# Patient Record
Sex: Female | Born: 1955 | Smoking: Never smoker
Health system: Southern US, Community
[De-identification: ages and names within clinical notes are randomized; demographics above are authoritative.]

## PROBLEM LIST (undated history)

## (undated) DIAGNOSIS — F419 Anxiety disorder, unspecified: Secondary | ICD-10-CM

## (undated) DIAGNOSIS — N891 Moderate vaginal dysplasia: Secondary | ICD-10-CM

## (undated) DIAGNOSIS — K759 Inflammatory liver disease, unspecified: Secondary | ICD-10-CM

## (undated) DIAGNOSIS — R87620 Atypical squamous cells of undetermined significance on cytologic smear of vagina (ASC-US): Secondary | ICD-10-CM

## (undated) DIAGNOSIS — Z973 Presence of spectacles and contact lenses: Secondary | ICD-10-CM

## (undated) DIAGNOSIS — K589 Irritable bowel syndrome without diarrhea: Secondary | ICD-10-CM

## (undated) DIAGNOSIS — R32 Unspecified urinary incontinence: Secondary | ICD-10-CM

## (undated) DIAGNOSIS — Z87412 Personal history of vulvar dysplasia: Secondary | ICD-10-CM

## (undated) DIAGNOSIS — C801 Malignant (primary) neoplasm, unspecified: Secondary | ICD-10-CM

## (undated) DIAGNOSIS — I10 Essential (primary) hypertension: Secondary | ICD-10-CM

## (undated) DIAGNOSIS — R3989 Other symptoms and signs involving the genitourinary system: Secondary | ICD-10-CM

## (undated) DIAGNOSIS — N816 Rectocele: Secondary | ICD-10-CM

## (undated) DIAGNOSIS — T7840XA Allergy, unspecified, initial encounter: Secondary | ICD-10-CM

## (undated) DIAGNOSIS — M1990 Unspecified osteoarthritis, unspecified site: Secondary | ICD-10-CM

## (undated) DIAGNOSIS — M8580 Other specified disorders of bone density and structure, unspecified site: Secondary | ICD-10-CM

## (undated) DIAGNOSIS — R87811 Vaginal high risk human papillomavirus (HPV) DNA test positive: Secondary | ICD-10-CM

## (undated) HISTORY — PX: TUBAL LIGATION: SHX77

## (undated) HISTORY — DX: Allergy, unspecified, initial encounter: T78.40XA

## (undated) HISTORY — DX: Anxiety disorder, unspecified: F41.9

## (undated) HISTORY — DX: Irritable bowel syndrome, unspecified: K58.9

## (undated) HISTORY — DX: Unspecified urinary incontinence: R32

## (undated) HISTORY — DX: Moderate vaginal dysplasia: N89.1

## (undated) HISTORY — PX: COLONOSCOPY: SHX174

## (undated) HISTORY — DX: Atypical squamous cells of undetermined significance on cytologic smear of vagina (ASC-US): R87.620

## (undated) HISTORY — DX: Essential (primary) hypertension: I10

## (undated) HISTORY — DX: Other specified disorders of bone density and structure, unspecified site: M85.80

## (undated) HISTORY — DX: Vaginal high risk human papillomavirus (HPV) DNA test positive: R87.811

## (undated) HISTORY — DX: Irritable bowel syndrome without diarrhea: K58.9

---

## 1997-04-17 HISTORY — PX: VAGINAL HYSTERECTOMY: SUR661

## 2003-04-28 ENCOUNTER — Ambulatory Visit (HOSPITAL_COMMUNITY): Admission: RE | Admit: 2003-04-28 | Discharge: 2003-04-28 | Payer: Self-pay | Admitting: Family Medicine

## 2003-05-14 ENCOUNTER — Other Ambulatory Visit: Admission: RE | Admit: 2003-05-14 | Discharge: 2003-05-14 | Payer: Self-pay | Admitting: Dermatology

## 2004-01-03 ENCOUNTER — Emergency Department (HOSPITAL_COMMUNITY): Admission: EM | Admit: 2004-01-03 | Discharge: 2004-01-03 | Payer: Self-pay | Admitting: Emergency Medicine

## 2004-11-01 ENCOUNTER — Other Ambulatory Visit: Admission: RE | Admit: 2004-11-01 | Discharge: 2004-11-01 | Payer: Self-pay | Admitting: *Deleted

## 2006-01-10 ENCOUNTER — Other Ambulatory Visit: Admission: RE | Admit: 2006-01-10 | Discharge: 2006-01-10 | Payer: Self-pay | Admitting: *Deleted

## 2008-10-07 ENCOUNTER — Ambulatory Visit (HOSPITAL_COMMUNITY): Admission: RE | Admit: 2008-10-07 | Discharge: 2008-10-07 | Payer: Self-pay | Admitting: Family Medicine

## 2008-10-12 ENCOUNTER — Ambulatory Visit (HOSPITAL_COMMUNITY): Admission: RE | Admit: 2008-10-12 | Discharge: 2008-10-12 | Payer: Self-pay | Admitting: Obstetrics & Gynecology

## 2010-05-07 ENCOUNTER — Encounter: Payer: Self-pay | Admitting: Family Medicine

## 2011-07-17 HISTORY — PX: AUGMENTATION MAMMAPLASTY: SUR837

## 2012-05-23 ENCOUNTER — Ambulatory Visit: Payer: Self-pay | Admitting: Gynecology

## 2012-05-29 ENCOUNTER — Ambulatory Visit: Payer: Self-pay | Admitting: Gynecology

## 2012-05-30 ENCOUNTER — Ambulatory Visit: Payer: Self-pay | Admitting: Gynecology

## 2012-06-11 ENCOUNTER — Ambulatory Visit: Payer: Self-pay | Admitting: Gynecology

## 2012-06-26 ENCOUNTER — Encounter: Payer: Self-pay | Admitting: Gynecology

## 2012-06-26 ENCOUNTER — Ambulatory Visit (INDEPENDENT_AMBULATORY_CARE_PROVIDER_SITE_OTHER): Payer: BC Managed Care – PPO | Admitting: Gynecology

## 2012-06-26 ENCOUNTER — Other Ambulatory Visit (HOSPITAL_COMMUNITY)
Admission: RE | Admit: 2012-06-26 | Discharge: 2012-06-26 | Disposition: A | Discharge status: Home / Self Care | Payer: BC Managed Care – PPO | Source: Ambulatory Visit | Attending: Gynecology | Admitting: Gynecology

## 2012-06-26 VITALS — BP 130/84 | Ht 66.5 in | Wt 143.0 lb

## 2012-06-26 DIAGNOSIS — Z1151 Encounter for screening for human papillomavirus (HPV): Secondary | ICD-10-CM | POA: Insufficient documentation

## 2012-06-26 DIAGNOSIS — G4700 Insomnia, unspecified: Secondary | ICD-10-CM

## 2012-06-26 DIAGNOSIS — Z01419 Encounter for gynecological examination (general) (routine) without abnormal findings: Secondary | ICD-10-CM

## 2012-06-26 DIAGNOSIS — R233 Spontaneous ecchymoses: Secondary | ICD-10-CM

## 2012-06-26 DIAGNOSIS — R238 Other skin changes: Secondary | ICD-10-CM

## 2012-06-26 DIAGNOSIS — Z780 Asymptomatic menopausal state: Secondary | ICD-10-CM

## 2012-06-26 LAB — CBC WITH DIFFERENTIAL/PLATELET
Basophils Absolute: 0 10*3/uL (ref 0.0–0.1)
Basophils Relative: 0 % (ref 0–1)
Eosinophils Absolute: 0.1 10*3/uL (ref 0.0–0.7)
Eosinophils Relative: 2 % (ref 0–5)
HCT: 42.8 % (ref 36.0–46.0)
Hemoglobin: 14.4 g/dL (ref 12.0–15.0)
Lymphocytes Relative: 32 % (ref 12–46)
Lymphs Abs: 1.9 10*3/uL (ref 0.7–4.0)
MCH: 31 pg (ref 26.0–34.0)
MCHC: 33.6 g/dL (ref 30.0–36.0)
MCV: 92.2 fL (ref 78.0–100.0)
Monocytes Absolute: 0.4 10*3/uL (ref 0.1–1.0)
Monocytes Relative: 6 % (ref 3–12)
Neutro Abs: 3.7 10*3/uL (ref 1.7–7.7)
Neutrophils Relative %: 60 % (ref 43–77)
Platelets: 264 10*3/uL (ref 150–400)
RBC: 4.64 MIL/uL (ref 3.87–5.11)
RDW: 13.5 % (ref 11.5–15.5)
WBC: 6.1 10*3/uL (ref 4.0–10.5)

## 2012-06-26 MED ORDER — ESTRADIOL 0.52 MG/0.87 GM (0.06%) TD GEL: 1.0000 "application " | Freq: Every day | TRANSDERMAL | Status: DC

## 2012-06-26 NOTE — Patient Instructions (Addendum)
Shingles Vaccine What You Need to Know WHAT IS SHINGLES?  Shingles is a painful skin rash, often with blisters. It is also called Herpes Zoster or just Zoster.  A shingles rash usually appears on one side of the face or body and lasts from 2 to 4 weeks. Its main symptom is pain, which can be quite severe. Other symptoms of shingles can include fever, headache, chills, and upset stomach. Very rarely, a shingles infection can lead to pneumonia, hearing problems, blindness, brain inflammation (encephalitis), or death.  For about 1 person in 5, severe pain can continue even after the rash clears up. This is called post-herpetic neuralgia.  Shingles is caused by the Varicella Zoster virus. This is the same virus that causes chickenpox. Only someone who has had a case of chickenpox or rarely, has gotten chickenpox vaccine, can get shingles. The virus stays in your body. It can reappear many years later to cause a case of shingles.  You cannot catch shingles from another person with shingles. However, a person who has never had chickenpox (or chickenpox vaccine) could get chickenpox from someone with shingles. This is not very common.  Shingles is far more common in people 50 and older than in younger people. It is also more common in people whose immune systems are weakened because of a disease such as cancer or drugs such as steroids or chemotherapy.  At least 1 million people get shingles per year in the [place]. SHINGLES VACCINE  A vaccine for shingles was licensed in 2006. In clinical trials, the vaccine reduced the risk of shingles by 50%. It can also reduce the pain in people who still get shingles after being vaccinated.  A single dose of shingles vaccine is recommended for adults 60 years of age and older. SOME PEOPLE SHOULD NOT GET SHINGLES VACCINE OR SHOULD WAIT A person should not get shingles vaccine if he or she:  Has ever had a life-threatening allergic reaction to gelatin, the  antibiotic neomycin, or any other component of shingles vaccine. Tell your caregiver if you have any severe allergies.  Has a weakened immune system because of current:  AIDS or another disease that affects the immune system.  Treatment with drugs that affect the immune system, such as prolonged use of high-dose steroids.  Cancer treatment, such as radiation or chemotherapy.  Cancer affecting the bone marrow or lymphatic system, such as leukemia or lymphoma.  Is pregnant, or might be pregnant. Women should not become pregnant until at least 4 weeks after getting shingles vaccine. Someone with a minor illness, such as a cold, may be vaccinated. Anyone with a moderate or severe acute illness should usually wait until he or she recovers before getting the vaccine. This includes anyone with a temperature of 101.3 F (38 C) or higher. WHAT ARE THE RISKS FROM SHINGLES VACCINE?  A vaccine, like any medicine, could possibly cause serious problems, such as severe allergic reactions. However, the risk of a vaccine causing serious harm, or death, is extremely small.  No serious problems have been identified with shingles vaccine. Mild Problems  Redness, soreness, swelling, or itching at the site of the injection (about 1 person in 3).  Headache (about 1 person in 70). Like all vaccines, shingles vaccine is being closely monitored for unusual or severe problems. WHAT IF THERE IS A MODERATE OR SEVERE REACTION? What should I look for? Any unusual condition, such as a severe allergic reaction or a high fever. If a severe allergic reaction   occurred, it would be within a few minutes to an hour after the shot. Signs of a serious allergic reaction can include difficulty breathing, weakness, hoarseness or wheezing, a fast heartbeat, hives, dizziness, paleness, or swelling of the throat. What should I do?  Call your caregiver, or get the person to a caregiver right away.  Tell the caregiver what  happened, the date and time it happened, and when the vaccination was given.  Ask the caregiver to report the reaction by filing a Vaccine Adverse Event Reporting System (VAERS) form. Or, you can file this report through the VAERS web site at www.vaers.LAgents.no or by calling 1-220-078-3236. VAERS does not provide medical advice. HOW CAN I LEARN MORE?  Ask your caregiver. He or she can give you the vaccine package insert or suggest other sources of information.  Contact the Centers for Disease Control and Prevention (CDC):  Call 615-685-7769 (1-800-CDC-INFO).  Visit the CDC website at PicCapture.uy CDC Shingles Vaccine VIS (01/21/08) Document Released: 01/29/2006 Document Revised: 06/26/2011 Document Reviewed: 01/21/2008 [facility] Patient Information 2013 [place], [place].  Menopause Menopause is the normal time of life when menstrual periods stop completely. Menopause is complete when you have missed 12 consecutive menstrual periods. It usually occurs between the ages of 67 to 77, with an average age of 17. Very rarely does a woman develop menopause before 58 years old. At menopause, your ovaries stop producing the female hormones, estrogen and progesterone. This can cause undesirable symptoms and also affect your health. Sometimes the symptoms may occur 4 to 5 years before the menopause begins. There is no relationship between menopause and:  Oral contraceptives.  Number of children you had.  Race.  The age your menstrual periods started (menarche). Heavy smokers and very thin women may develop menopause earlier in life. CAUSES  The ovaries stop producing the female hormones estrogen and progesterone.  Other causes include:  Surgery to remove both ovaries.  The ovaries stop functioning for no known reason.  Tumors of the pituitary gland in the brain.  Medical disease that affects the ovaries and hormone production.  Radiation treatment to the abdomen or  pelvis.  Chemotherapy that affects the ovaries. SYMPTOMS   Hot flashes.  Night sweats.  Decrease in sex drive.  Vaginal dryness and thinning of the vagina causing painful intercourse.  Dryness of the skin and developing wrinkles.  Headaches.  Tiredness.  Irritability.  Memory problems.  Weight gain.  Bladder infections.  Hair growth of the face and chest.  Infertility. More serious symptoms include:  Loss of bone (osteoporosis) causing breaks (fractures).  Depression.  Hardening and narrowing of the arteries (atherosclerosis) causing heart attacks and strokes. DIAGNOSIS   When the menstrual periods have stopped for 12 straight months.  Physical exam.  Hormone studies of the blood. TREATMENT  There are many treatment choices and nearly as many questions about them. The decisions to treat or not to treat menopausal changes is an individual choice made with your caregiver. Your caregiver can discuss the treatments with you. Together, you can decide which treatment will work best for you. Your treatment choices may include:   Hormone therapy (estorgen and progesterone).  Non-hormonal medications.  Treating the individual symptoms with medication (for example antidepressants for depression).  Herbal medications that may help specific symptoms.  Counseling by a psychiatrist or psychologist.  Group therapy.  Lifestyle changes including:  Eating healthy.  Regular exercise.  Limiting caffeine and alcohol.  Stress management and meditation.  No treatment. HOME CARE INSTRUCTIONS  Take the medication your caregiver gives you as directed.  Get plenty of sleep and rest.  Exercise regularly.  Eat a diet that contains calcium (good for the bones) and soy products (acts like estrogen hormone).  Avoid alcoholic beverages.  Do not smoke.  If you have hot flashes, dress in layers.  Take supplements, calcium and vitamin D to strengthen bones.  You  can use over-the-counter lubricants or moisturizers for vaginal dryness.  Group therapy is sometimes very helpful.  Acupuncture may be helpful in some cases. SEEK MEDICAL CARE IF:   You are not sure you are in menopause.  You are having menopausal symptoms and need advice and treatment.  You are still having menstrual periods after age 44.  You have pain with intercourse.  Menopause is complete (no menstrual period for 12 months) and you develop vaginal bleeding.  You need a referral to a specialist (gynecologist, psychiatrist or psychologist) for treatment. SEEK IMMEDIATE MEDICAL CARE IF:   You have severe depression.  You have excessive vaginal bleeding.  You fell and think you have a broken bone.  You have pain when you urinate.  You develop leg or chest pain.  You have a fast pounding heart beat (palpitations).  You have severe headaches.  You develop vision problems.  You feel a lump in your breast.  You have abdominal pain or severe indigestion. Document Released: 06/24/2003 Document Revised: 06/26/2011 Document Reviewed: 01/30/2008 [facility] Patient Information 2013 [place], [place].  Hormone Therapy At menopause, your body begins making less estrogen and progesterone hormones. This causes the body to stop having menstrual periods. This is because estrogen and progesterone hormones control your periods and menstrual cycle. A lack of estrogen may cause symptoms such as:  Hot flushes (or hot flashes).  Vaginal dryness.  Dry skin.  Loss of sex drive.  Risk of bone loss (osteoporosis). When this happens, you may choose to take hormone therapy to get back the estrogen lost during menopause. When the hormone estrogen is given alone, it is usually referred to as ET (Estrogen Therapy). When the hormone progestin is combined with estrogen, it is generally called HT (Hormone Therapy). This was formerly known as hormone replacement therapy (HRT). Your caregiver can  help you make a decision on what will be best for you. The decision to use HT seems to change often as new studies are done. Many studies do not agree on the benefits of hormone replacement therapy. LIKELY BENEFITS OF HT INCLUDE PROTECTION FROM:  Hot Flushes (also called hot flashes) - A hot flush is a sudden feeling of heat that spreads over the face and body. The skin may redden like a blush. It is connected with sweats and sleep disturbance. Women going through menopause may have hot flushes a few times a month or several times per day depending on the woman.  Osteoporosis (bone loss)- Estrogen helps guard against bone loss. After menopause, a woman's bones slowly lose calcium and become weak and brittle. As a result, bones are more likely to break. The hip, wrist, and spine are affected most often. Hormone therapy can help slow bone loss after menopause. Weight bearing exercise and taking calcium with vitamin D also can help prevent bone loss. There are also medications that your caregiver can prescribe that can help prevent osteoporosis.  Vaginal Dryness - Loss of estrogen causes changes in the vagina. Its lining may become thin and dry. These changes can cause pain and bleeding during sexual intercourse. Dryness can also lead  to infections. This can cause burning and itching. (Vaginal estrogen treatment can help relieve pain, itching, and dryness.)  Urinary Tract Infections are more common after menopause because of lack of estrogen. Some women also develop urinary incontinence because of low estrogen levels in the vagina and bladder.  Possible other benefits of estrogen include a positive effect on mood and short-term memory in women. RISKS AND COMPLICATIONS  Using estrogen alone without progesterone causes the lining of the uterus to grow. This increases the risk of lining of the uterus (endometrial) cancer. Your caregiver should give another hormone called progestin if you have a  uterus.  Women who take combined (estrogen and progestin) HT appear to have an increased risk of breast cancer. The risk appears to be small, but increases throughout the time that HT is taken.  Combined therapy also makes the breast tissue slightly denser which makes it harder to read mammograms (breast X-rays).  Combined, estrogen and progesterone therapy can be taken together every day, in which case there may be spotting of blood. HT therapy can be taken cyclically in which case you will have menstrual periods. Cyclically means HT is taken for a set amount of days, then not taken, then this process is repeated.  HT may increase the risk of stroke, heart attack, breast cancer and forming blood clots in your leg.  Transdermal estrogen (estrogen that is absorbed through the skin with a patch or a cream) may have more positive results with:  Cholesterol.  Blood pressure.  Blood clots. Having the following conditions may indicate you should not have HT:  Endometrial cancer.  Liver disease.  Breast cancer.  Heart disease.  History of blood clots.  Stroke. TREATMENT   If you choose to take HT and have a uterus, usually estrogen and progestin are prescribed.  Your caregiver will help you decide the best way to take the medications.  Possible ways to take estrogen include:  Pills.  Patches.  Gels.  Sprays.  Vaginal estrogen cream, rings and tablets.  It is best to take the lowest dose possible that will help your symptoms and take them for the shortest period of time that you can.  Hormone therapy can help relieve some of the problems (symptoms) that affect women at menopause. Before making a decision about HT, talk to your caregiver about what is best for you. Be well informed and comfortable with your decisions. HOME CARE INSTRUCTIONS   Follow your caregivers advice when taking the medications.  A Pap test is done to screen for cervical cancer.  The first Pap  test should be done at age 46.  Between ages 92 and 49, Pap tests are repeated every 2 years.  Beginning at age 54, you are advised to have a Pap test every 3 years as long as your past 3 Pap tests have been normal.  Some women have medical problems that increase the chance of getting cervical cancer. Talk to your caregiver about these problems. It is especially important to talk to your caregiver if a new problem develops soon after your last Pap test. In these cases, your caregiver may recommend more frequent screening and Pap tests.  The above recommendations are the same for women who have or have not gotten the vaccine for HPV (Human Papillomavirus).  If you had a hysterectomy for a problem that was not a cancer or a condition that could lead to cancer, then you no longer need Pap tests. However, even if you no longer  need a Pap test, a regular exam is a good idea to make sure no other problems are starting.   If you are between ages 73 and 67, and you have had normal Pap tests going back 10 years, you no longer need Pap tests. However, even if you no longer need a Pap test, a regular exam is a good idea to make sure no other problems are starting.   If you have had past treatment for cervical cancer or a condition that could lead to cancer, you need Pap tests and screening for cancer for at least 20 years after your treatment.  If Pap tests have been discontinued, risk factors (such as a new sexual partner) need to be re-assessed to determine if screening should be resumed.  Some women may need screenings more often if they are at high risk for cervical cancer.  Get mammograms done as per the advice of your caregiver. SEEK IMMEDIATE MEDICAL CARE IF:  You develop abnormal vaginal bleeding.  You have pain or swelling in your legs, shortness of breath, or chest pain.  You develop dizziness or headaches.  You have lumps or changes in your breasts or armpits.  You have slurred  speech.  You develop weakness or numbness of your arms or legs.  You have pain, burning, or bleeding when urinating.  You develop abdominal pain. Document Released: 12/31/2002 Document Revised: 06/26/2011 Document Reviewed: 04/20/2010 [facility] Patient Information 2013 [place], [place].

## 2012-06-27 ENCOUNTER — Encounter: Payer: Self-pay | Admitting: Internal Medicine

## 2012-06-27 ENCOUNTER — Other Ambulatory Visit: Payer: Self-pay

## 2012-06-27 DIAGNOSIS — Z1231 Encounter for screening mammogram for malignant neoplasm of breast: Secondary | ICD-10-CM

## 2012-06-27 LAB — FOLLICLE STIMULATING HORMONE: FSH: 41.9 m[IU]/mL

## 2012-06-27 LAB — TSH: TSH: 0.756 u[IU]/mL (ref 0.350–4.500)

## 2012-06-27 NOTE — Progress Notes (Signed)
Stephanie Armstrong 1955-07-25 [id number]   History:    56 y.o.  for annual gyn exam who is new to the practice. Patient has been complaining of insomnia and minor vasomotor symptoms. Patient with past history of TVH secondary to menorrhagia. Patient denies any prior history of abnormal Pap smears. Patient with no prior screening colonoscopy. She brought some lab work from her primary physician at [facility] ( Dr. Gerda Diss). It was noted that her [facility] and LH were in the menopausal range. She had a normal basal metabolic panel and lipid profile with the exception of slightly elevated total cholesterol at 209. She brought to my attention that she has easy  Bruisability. She also brought to my attention that her mother has history of Paget's disease.  Past medical history,surgical history, family history and social history were all reviewed and documented in the EPIC chart.  Gynecologic History No LMP recorded. Patient has had a hysterectomy. Contraception: status post hysterectomy Last Pap: ?Marland Kitchen Results were: no prior history of abnormal Pap smears Last mammogram: greater than 5 years ago. Results were: normal  Obstetric History OB History   Grav Para Term Preterm Abortions TAB SAB Ect Mult Living   3 3        3      # Outc Date GA Lbr Len/2nd Wgt Sex Del Anes PTL Lv   1 PAR            2 PAR            3 PAR                ROS: A ROS was performed and pertinent positives and negatives are included in the history.  GENERAL: No fevers or chills. HEENT: No change in vision, no earache, sore throat or sinus congestion. NECK: No pain or stiffness. CARDIOVASCULAR: No chest pain or pressure. No palpitations. PULMONARY: No shortness of breath, cough or wheeze. GASTROINTESTINAL: No abdominal pain, nausea, vomiting or diarrhea, melena or bright red blood per rectum. GENITOURINARY: No urinary frequency, urgency, hesitancy or dysuria. MUSCULOSKELETAL: No joint or muscle pain, no back pain, no  recent trauma. DERMATOLOGIC: No rash, no itching, no lesions. ENDOCRINE: No polyuria, polydipsia, no heat or cold intolerance. No recent change in weight. HEMATOLOGICAL: No anemia or easy bruising or bleeding. NEUROLOGIC: No headache, seizures, numbness, tingling or weakness. PSYCHIATRIC: No depression, no loss of interest in normal activity or change in sleep pattern.     Exam: chaperone present  BP 130/84  Ht 5' 6.5" (1.689 m)  Wt 143 lb (64.864 kg)  BMI 22.74 kg/m2  Body mass index is 22.74 kg/(m^2).  General appearance : Well developed well nourished female. No acute distress HEENT: Neck supple, trachea midline, no carotid bruits, no thyroidmegaly Lungs: Clear to auscultation, no rhonchi or wheezes, or rib retractions  Heart: Regular rate and rhythm, no murmurs or gallops Breast:Examined in sitting and supine position were symmetrical in appearance, no palpable masses or tenderness,  no skin retraction, no nipple inversion, no nipple discharge, no skin discoloration, no axillary or supraclavicular lymphadenopathy Abdomen: no palpable masses or tenderness, no rebound or guarding Extremities: no edema or skin discoloration or tenderness  Pelvic:  Bartholin, Urethra, Skene Glands: Within normal limits             Vagina: No gross lesions or discharge  Cervix: absent  Uterus Absent  Adnexa  Without masses or tenderness  Anus and perineum  normal   Rectovaginal  normal sphincter  tone without palpated masses or tenderness             Hemoccult card provided     Assessment/Plan:  57 y.o. female for annual exam who is menopausal. Her symptoms may be best served by starting her on a low  Dose topical estrogen such as elestrin 0.06% for her to apply transdermally to one arm each bedtime. We had a long discussion of the women's health initiative study. We discussed potential risk of breast cancer. We discussed the risks benefits pros and cons. We'll also check a von Willebrand panel as a  result of her easy bruisability as well as a CBC. We'll also check a TSH and we will do her Pap smear today. We did discuss the new screening guidelines. She was given names of colleagues in the community to schedule colonoscopy. I did give her the Hemoccult cards to present to the office for testing a later date. She will schedule a bone density study here in our office the next couple weeks. She was given a prescription to schedule her shingles vaccine. She was also provided with a requisition to schedule her mammogram and was stressed upon her the importance of monthly self breast examination. We also discussed importance of calcium vitamin D for osteoporosis prevention. We'll hold off on giving her any medication for insomnia see how she responds to the estrogen replacement therapy.    Ok Edwards MD, 9:34 AM 06/27/2012

## 2012-07-01 LAB — VON WILLEBRAND PANEL
Coagulation Factor VIII: 64 % — ABNORMAL LOW (ref 73–140)
Ristocetin Co-factor, Plasma: 71 % (ref 42–200)
Von Willebrand Antigen, Plasma: 76 % (ref 50–217)

## 2012-07-02 ENCOUNTER — Other Ambulatory Visit: Payer: Self-pay | Admitting: Gynecology

## 2012-07-02 DIAGNOSIS — R233 Spontaneous ecchymoses: Secondary | ICD-10-CM

## 2012-07-02 DIAGNOSIS — R238 Other skin changes: Secondary | ICD-10-CM

## 2012-07-09 ENCOUNTER — Other Ambulatory Visit: Payer: BC Managed Care – PPO

## 2012-07-09 DIAGNOSIS — R233 Spontaneous ecchymoses: Secondary | ICD-10-CM

## 2012-07-09 DIAGNOSIS — R238 Other skin changes: Secondary | ICD-10-CM

## 2012-07-11 LAB — FACTOR 8 ASSAY: Coagulation Factor VIII: 71 % — ABNORMAL LOW (ref 73–140)

## 2012-07-12 ENCOUNTER — Telehealth: Payer: Self-pay | Admitting: Gynecology

## 2012-07-12 NOTE — Telephone Encounter (Signed)
Patient was seen the office as a new patient on 06/27/2012 see previous note for detail. During the examination brought to my attention that time she has noticed easy bruisability. She had a previous vaginal hysterectomy secondary to menorrhagia at another facility with no problems at that point. Her recent lab work consisted of the following:  Normal CBC Von Willebrand panel:            Factor VIII with a value of 64 (normal 73-140%)            Factor VIII repeated 2 weeks later value of 71            Von Willebrand antigen 76 normal range            Ristocetin : 71 normal range  I discussed the case with one of the hematologist oncologist at the [facility] and he did not seem worried at this point having patient had previous surgery with no problems and value for factor VIII slightly below normal. Recommendations to repeat the test in one year.

## 2012-07-16 ENCOUNTER — Telehealth: Payer: Self-pay | Admitting: *Deleted

## 2012-07-16 NOTE — Telephone Encounter (Signed)
Pt is calling requesting lab results for Factor VIII  07/09/12. Please advise

## 2012-07-16 NOTE — Telephone Encounter (Signed)
Pt informed with repeat test in 1 year.

## 2012-07-16 NOTE — Telephone Encounter (Signed)
Patient was seen the office as a new patient on 06/27/2012 see previous note for detail. During the examination brought to my attention that time she has noticed easy bruisability. She had a previous vaginal hysterectomy secondary to menorrhagia at another facility with no problems at that point. Her recent lab work consisted of the following:  Normal CBC Von Willebrand panel:            Factor VIII with a value of 64 (normal 73-140%)            Factor VIII repeated 2 weeks later value of 71            Von Willebrand antigen 76 normal range            Ristocetin : 71 normal range  I discussed the case with one of the hematologist oncologist at the [facility] and he did not seem worried at this point having patient had previous surgery with no problems and value for factor VIII slightly below normal. Recommendations to repeat the test in one year. 

## 2012-07-30 ENCOUNTER — Ambulatory Visit (INDEPENDENT_AMBULATORY_CARE_PROVIDER_SITE_OTHER): Payer: BC Managed Care – PPO

## 2012-07-30 DIAGNOSIS — M8580 Other specified disorders of bone density and structure, unspecified site: Secondary | ICD-10-CM

## 2012-07-30 DIAGNOSIS — M899 Disorder of bone, unspecified: Secondary | ICD-10-CM

## 2012-07-30 DIAGNOSIS — Z780 Asymptomatic menopausal state: Secondary | ICD-10-CM

## 2012-07-31 ENCOUNTER — Ambulatory Visit (AMBULATORY_SURGERY_CENTER): Payer: BC Managed Care – PPO

## 2012-07-31 VITALS — Ht 67.0 in | Wt 141.4 lb

## 2012-07-31 DIAGNOSIS — Z1211 Encounter for screening for malignant neoplasm of colon: Secondary | ICD-10-CM

## 2012-07-31 MED ORDER — MOVIPREP 100 G PO SOLR: 1.0000 | Freq: Once | ORAL | Status: DC

## 2012-08-01 ENCOUNTER — Encounter: Payer: Self-pay | Admitting: Internal Medicine

## 2012-08-02 ENCOUNTER — Ambulatory Visit
Admission: RE | Admit: 2012-08-02 | Discharge: 2012-08-02 | Disposition: A | Discharge status: Home / Self Care | Payer: BC Managed Care – PPO | Source: Ambulatory Visit

## 2012-08-02 DIAGNOSIS — Z1231 Encounter for screening mammogram for malignant neoplasm of breast: Secondary | ICD-10-CM

## 2012-08-08 ENCOUNTER — Ambulatory Visit (INDEPENDENT_AMBULATORY_CARE_PROVIDER_SITE_OTHER): Payer: BC Managed Care – PPO | Admitting: Family Medicine

## 2012-08-08 ENCOUNTER — Encounter: Payer: Self-pay | Admitting: Family Medicine

## 2012-08-08 VITALS — BP 132/80 | Temp 97.8°F | Wt 139.2 lb

## 2012-08-08 DIAGNOSIS — M2241 Chondromalacia patellae, right knee: Secondary | ICD-10-CM | POA: Insufficient documentation

## 2012-08-08 DIAGNOSIS — M2240 Chondromalacia patellae, unspecified knee: Secondary | ICD-10-CM

## 2012-08-08 MED ORDER — ETODOLAC 400 MG PO TABS: 400.0000 mg | ORAL_TABLET | Freq: Two times a day (BID) | ORAL | Status: DC

## 2012-08-08 NOTE — Progress Notes (Signed)
  Subjective:    Patient ID: Stephanie Armstrong, female    DOB: [birth year], 57 y.o.   MRN: [id number]  HPI Hx of leg injury two yrs ag. This occurred with excessive running. Now patient gets stiffness in both legs. Right knee is primarily medial knee pain. Morning stiffness. Several weeks ago started running again. Has developed swelling in the last 2 weeks. Also discomfort behind the knee. No injury back in school. Has small red patch of her eyes was sitting on her knee last night. Taking occasional over-the-counter meds.   Review of Systems ROS otherwise negative.    Objective:   Physical Exam  Alert no acute distress. Vitals review. Lungs clear. Heart regular in rhythm. Knee diffusely inflamed. Very significant crepitations. Probable posterior knee Baker's cyst palpated. 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 #2 cold skin damage-discussed. Plan consider taking a different form of exercise. Lodine twice a day with food when necessary. No x-rays at this time. Rationale discussed. WSL

## 2012-08-08 NOTE — Patient Instructions (Signed)
Chondromalacia patella plus arthritis. Take the antiinflam med as diredted

## 2012-08-14 ENCOUNTER — Encounter: Payer: BC Managed Care – PPO | Admitting: Internal Medicine

## 2012-08-29 ENCOUNTER — Encounter: Payer: Self-pay | Admitting: Internal Medicine

## 2012-08-29 ENCOUNTER — Ambulatory Visit (AMBULATORY_SURGERY_CENTER): Payer: BC Managed Care – PPO | Admitting: Internal Medicine

## 2012-08-29 VITALS — BP 118/76 | HR 54 | Temp 97.6°F | Resp 18 | Ht 67.0 in | Wt 139.0 lb

## 2012-08-29 DIAGNOSIS — Z1211 Encounter for screening for malignant neoplasm of colon: Secondary | ICD-10-CM

## 2012-08-29 MED ORDER — SODIUM CHLORIDE 0.9 % IV SOLN: 500.0000 mL | INTRAVENOUS | Status: DC

## 2012-08-29 NOTE — Progress Notes (Signed)
Patient did not experience any of the following events: a burn prior to discharge; a fall within the facility; wrong site/side/patient/procedure/implant event; or a hospital transfer or hospital admission upon discharge from the facility. (G8907) Patient did not have preoperative order for IV antibiotic SSI prophylaxis. (G8918)  

## 2012-08-29 NOTE — Patient Instructions (Addendum)
YOU HAD AN ENDOSCOPIC PROCEDURE TODAY AT THE [facility]: Refer to the procedure report that was given to you for any specific questions about what was found during the examination.  If the procedure report does not answer your questions, please call your gastroenterologist to clarify.  If you requested that your care partner not be given the details of your procedure findings, then the procedure report has been included in a sealed envelope for you to review at your convenience later.  YOU SHOULD EXPECT: Some feelings of bloating in the abdomen. Passage of more gas than usual.  Walking can help get rid of the air that was put into your GI tract during the procedure and reduce the bloating. If you had a lower endoscopy (such as a colonoscopy or flexible sigmoidoscopy) you may notice spotting of blood in your stool or on the toilet paper. If you underwent a bowel prep for your procedure, then you may not have a normal bowel movement for a few days.  DIET: Your first meal following the procedure should be a light meal and then it is ok to progress to your normal diet.  A half-sandwich or bowl of soup is an example of a good first meal.  Heavy or fried foods are harder to digest and may make you feel nauseous or bloated.  Likewise meals heavy in dairy and vegetables can cause extra gas to form and this can also increase the bloating.  Drink plenty of fluids but you should avoid alcoholic beverages for 24 hours.  ACTIVITY: Your care partner should take you home directly after the procedure.  You should plan to take it easy, moving slowly for the rest of the day.  You can resume normal activity the day after the procedure however you should NOT DRIVE or use heavy machinery for 24 hours (because of the sedation medicines used during the test).    SYMPTOMS TO REPORT IMMEDIATELY: A gastroenterologist can be reached at any hour.  During normal business hours, 8:30 AM to 5:00 PM Monday through Friday,  call [phone].  After hours and on weekends, please call the GI answering service at [phone] who will take a message and have the physician on call contact you.   Following lower endoscopy (colonoscopy or flexible sigmoidoscopy):  Excessive amounts of blood in the stool  Significant tenderness or worsening of abdominal pains  Swelling of the abdomen that is new, acute  Fever of 100F or higher  FOLLOW UP: If any biopsies were taken you will be contacted by phone or by letter within the next 1-3 weeks.  Call your gastroenterologist if you have not heard about the biopsies in 3 weeks.  Our staff will call the home number listed on your records the next business day following your procedure to check on you and address any questions or concerns that you may have at that time regarding the information given to you following your procedure. This is a courtesy call and so if there is no answer at the home number and we have not heard from you through the emergency physician on call, we will assume that you have returned to your regular daily activities without incident.  SIGNATURES/CONFIDENTIALITY: You and/or your care partner have signed paperwork which will be entered into your electronic medical record.  These signatures attest to the fact that that the information above on your After Visit Summary has been reviewed and is understood.  Full responsibility of the confidentiality of this   discharge information lies with you and/or your care-partner.  Repeat colonoscopy in 10 years.  

## 2012-08-29 NOTE — Op Note (Signed)
[facility] [address] [place], [address]   COLONOSCOPY PROCEDURE REPORT  PATIENT: Stephanie Armstrong, Stephanie Armstrong  MR#: [id number] BIRTHDATE: 10-01-1955 , 56  yrs. old GENDER: Female ENDOSCOPIST: Roxy Cedar, MD REFERRED [id number] Gerda Diss, M.D. PROCEDURE DATE:  08/29/2012 PROCEDURE:   Colonoscopy, screening ASA CLASS:   Class II INDICATIONS:average risk screening. MEDICATIONS: MAC sedation, administered by CRNA and propofol (Diprivan) 330mg  IV  DESCRIPTION OF PROCEDURE:   After the risks benefits and alternatives of the procedure were thoroughly explained, informed consent was obtained.  A digital rectal exam revealed no abnormalities of the rectum.   The LB [id number]-[id number]  endoscope was introduced through the anus and advanced to the cecum, which was identified by both the appendix and ileocecal valve. No adverse events experienced.   The quality of the prep was excellent, using MoviPrep  The instrument was then slowly withdrawn as the colon was fully examined.      COLON FINDINGS: A normal appearing cecum, ileocecal valve, and appendiceal orifice were identified.  The ascending, hepatic flexure, transverse, splenic flexure, descending, sigmoid colon and rectum appeared unremarkable.  No polyps or cancers were seen. Retroflexed views revealed no abnormalities. The time to cecum=9 minutes 15 seconds.  Withdrawal time=15 minutes 25 seconds.  The scope was withdrawn and the procedure completed.  COMPLICATIONS: There were no complications.  ENDOSCOPIC IMPRESSION: 1. Normal colon  RECOMMENDATIONS: 1. Continue current colorectal screening recommendations for "routine risk" patients with a repeat colonoscopy in 10 years.   eSigned:  Roxy Cedar, MD 08/29/2012 9:54 AM   cc: Simone Curia, Md and The Patient

## 2012-08-30 ENCOUNTER — Telehealth: Payer: Self-pay | Admitting: *Deleted

## 2012-08-30 NOTE — Telephone Encounter (Signed)
  Follow up Call-  Call back number 08/29/2012  Post procedure Call Back phone  # [phone]  Permission to leave phone message Yes     Patient questions:  Do you have a fever, pain , or abdominal swelling? no Pain Score  0 *  Have you tolerated food without any problems? yes  Have you been able to return to your normal activities? yes  Do you have any questions about your discharge instructions: Diet   no Medications  no Follow up visit  no  Do you have questions or concerns about your Care? no  Actions: * If pain score is 4 or above: No action needed, pain <4.

## 2012-10-04 ENCOUNTER — Other Ambulatory Visit: Payer: Self-pay

## 2012-12-05 ENCOUNTER — Other Ambulatory Visit: Payer: Self-pay | Admitting: Family Medicine

## 2012-12-05 NOTE — Telephone Encounter (Signed)
Correction: I will send in one refill but recommend office visit within next month. Her last visit was for knee pain only

## 2012-12-05 NOTE — Telephone Encounter (Signed)
Please forward this request to Dr. Brett Canales. She has not had a visit for this since we've been on Epic. Thanks

## 2012-12-06 ENCOUNTER — Other Ambulatory Visit: Payer: Self-pay | Admitting: Family Medicine

## 2012-12-06 NOTE — Telephone Encounter (Signed)
Last office visit 08-08-12

## 2012-12-06 NOTE — Telephone Encounter (Signed)
done

## 2013-01-10 ENCOUNTER — Ambulatory Visit (INDEPENDENT_AMBULATORY_CARE_PROVIDER_SITE_OTHER): Payer: BC Managed Care – PPO | Admitting: Family Medicine

## 2013-01-10 ENCOUNTER — Encounter: Payer: Self-pay | Admitting: Family Medicine

## 2013-01-10 VITALS — BP 124/80 | Ht 67.5 in | Wt 139.4 lb

## 2013-01-10 DIAGNOSIS — I10 Essential (primary) hypertension: Secondary | ICD-10-CM

## 2013-01-10 DIAGNOSIS — F32A Depression, unspecified: Secondary | ICD-10-CM

## 2013-01-10 DIAGNOSIS — F3289 Other specified depressive episodes: Secondary | ICD-10-CM

## 2013-01-10 DIAGNOSIS — F329 Major depressive disorder, single episode, unspecified: Secondary | ICD-10-CM

## 2013-01-10 MED ORDER — AMLODIPINE BESY-BENAZEPRIL HCL 5-40 MG PO CAPS: ORAL_CAPSULE | ORAL | Status: DC

## 2013-01-10 MED ORDER — ESCITALOPRAM OXALATE 10 MG PO TABS: 10.0000 mg | ORAL_TABLET | Freq: Every day | ORAL | Status: DC

## 2013-01-10 NOTE — Progress Notes (Signed)
  Subjective:    Patient ID: Stephanie Armstrong, female    DOB: 07-14-1955, 57 y.o.   MRN: [id number]  HPI Patient is here today for 6 month follow up.  She needs a refill on meds.  Pt would also like something for depression. She says there is a lot going on right now in her life. Under tremendous stress. With mother and work.  Very anxious with it. Also trouble sleeping.  Patient was on Lexapro in the past. It didn't help her at that time.  Mo very negative and down, u  Under some financial stress also.  Claims no suicidal thoughts.  Positive significant insomnia  Review of Systems No chest pain no back pain no excessive shortness of breath    Objective:   Physical Exam Alert no apparent distress somewhat tearful. Lungs clear. Heart regular in rhythm. HEENT normal.       Assessment & Plan:  Impression 1 hypertension good control. #2 anxiety worsening. #3 element of depression definitely worsening. #4 insomnia. Plan very long discussion held 35 minutes spent with patient most in discussion. We will re\re and initiate Lexapro 10 mg daily. Maintain other medications. Diet exercise discussed. Recheck in several months. WSL

## 2013-01-12 DIAGNOSIS — I10 Essential (primary) hypertension: Secondary | ICD-10-CM | POA: Insufficient documentation

## 2013-01-12 DIAGNOSIS — F32A Depression, unspecified: Secondary | ICD-10-CM | POA: Insufficient documentation

## 2013-01-12 DIAGNOSIS — F329 Major depressive disorder, single episode, unspecified: Secondary | ICD-10-CM | POA: Insufficient documentation

## 2013-02-20 ENCOUNTER — Other Ambulatory Visit: Payer: Self-pay

## 2013-06-04 ENCOUNTER — Other Ambulatory Visit: Payer: Self-pay

## 2013-07-07 ENCOUNTER — Other Ambulatory Visit: Payer: Self-pay | Admitting: Family Medicine

## 2013-07-07 NOTE — Telephone Encounter (Signed)
Last seen 01/10/13

## 2013-07-07 NOTE — Telephone Encounter (Signed)
30 d only of both no further tref til seen

## 2013-07-18 ENCOUNTER — Encounter: Payer: Self-pay | Admitting: Family Medicine

## 2013-07-18 ENCOUNTER — Ambulatory Visit (INDEPENDENT_AMBULATORY_CARE_PROVIDER_SITE_OTHER): Payer: BC Managed Care – PPO | Admitting: Family Medicine

## 2013-07-18 VITALS — BP 130/80 | Temp 98.4°F | Ht 67.5 in | Wt 144.4 lb

## 2013-07-18 DIAGNOSIS — I10 Essential (primary) hypertension: Secondary | ICD-10-CM

## 2013-07-18 DIAGNOSIS — G4700 Insomnia, unspecified: Secondary | ICD-10-CM

## 2013-07-18 DIAGNOSIS — B029 Zoster without complications: Secondary | ICD-10-CM

## 2013-07-18 MED ORDER — HYDROCODONE-ACETAMINOPHEN 5-325 MG PO TABS: 1.0000 | ORAL_TABLET | Freq: Four times a day (QID) | ORAL | Status: DC | PRN

## 2013-07-18 MED ORDER — VALACYCLOVIR HCL 1 G PO TABS: 1000.0000 mg | ORAL_TABLET | Freq: Three times a day (TID) | ORAL | Status: DC

## 2013-07-18 NOTE — Progress Notes (Signed)
   Subjective:    Patient ID: Stephanie Armstrong, female    DOB: 1956/03/28, 58 y.o.   MRN: [id number]  Rash This is a new problem. The current episode started yesterday. The problem is unchanged. The affected locations include the right arm. The rash is characterized by redness. She was exposed to nothing. Past treatments include nothing. The treatment provided no relief.  Neck Pain  This is a new problem. The current episode started 1 to 4 weeks ago. The problem occurs constantly. The problem has been unchanged. The pain is associated with an unknown factor. The quality of the pain is described as aching. The pain is moderate. Nothing aggravates the symptoms. The pain is same all the time. Stiffness is present all day. She has tried nothing for the symptoms. The treatment provided no relief.   Bp meds faithfully, home numbers usually pretty good,  Numb overall good  Ten d ago started in upper left shoulder  Tender in nature  No known exp as far as rash goes    Rash itches so  Patient also has history of hypertension. Claims compliance with her medication. Exercising regularly. Trying to watch her salt intake. Has not missed a dose. Blood pressures generally good when checked elsewhere.  Did not take antidepressant for more than just a few days see prior note. Overall film better. Less stressed out. Has decide she would prefer to stay with medication if at all possible.  Rash is started crop up yesterday and is sensitive and tender.   Review of Systems  Musculoskeletal: Positive for neck pain.  Skin: Positive for rash.   No cough no chest pain no headache no fever ROS otherwise negative    Objective:   Physical Exam  Alert HEENT normal blood pressure good on repeat lungs clear heart regular in rhythm left superior clavicle region tender to palpation. Right arm to distinct erythematous patches with early probable blistering. Somewhat irregular. Ankles without edema.        Assessment & Plan:  Impression 1 hypertension good control. #2 depression clinically improved off meds. #3 probable shingles discussed at length plan Valtrex 177 days. Diet exercise discussed. Maintain same medications. Meds refilled. Recheck in 6 months. Hydrocodone when necessary for pain. Symptomatic care discussed. WSL

## 2013-07-18 NOTE — Patient Instructions (Addendum)
Also consider taking two aleave twice per day to help the inflammatory discomfort  Shingles Shingles (herpes zoster) is an infection that is caused by the same virus that causes chickenpox (varicella). The infection causes a painful skin rash and fluid-filled blisters, which eventually break open, crust over, and heal. It may occur in any area of the body, but it usually affects only one side of the body or face. The pain of shingles usually lasts about 1 month. However, some people with shingles may develop long-term (chronic) pain in the affected area of the body. Shingles often occurs many years after the person had chickenpox. It is more common:  In people older than 50 years.  In people with weakened immune systems, such as those with HIV, AIDS, or cancer.  In people taking medicines that weaken the immune system, such as transplant medicines.  In people under great stress. CAUSES  Shingles is caused by the varicella zoster virus (VZV), which also causes chickenpox. After a person is infected with the virus, it can remain in the person's body for years in an inactive state (dormant). To cause shingles, the virus reactivates and breaks out as an infection in a nerve root. The virus can be spread from person to person (contagious) through contact with open blisters of the shingles rash. It will only spread to people who have not had chickenpox. When these people are exposed to the virus, they may develop chickenpox. They will not develop shingles. Once the blisters scab over, the person is no longer contagious and cannot spread the virus to others. SYMPTOMS  Shingles shows up in stages. The initial symptoms may be pain, itching, and tingling in an area of the skin. This pain is usually described as burning, stabbing, or throbbing.In a few days or weeks, a painful red rash will appear in the area where the pain, itching, and tingling were felt. The rash is usually on one side of the body in a  band or belt-like pattern. Then, the rash usually turns into fluid-filled blisters. They will scab over and dry up in approximately 2 3 weeks. Flu-like symptoms may also occur with the initial symptoms, the rash, or the blisters. These may include:  Fever.  Chills.  Headache.  Upset stomach. DIAGNOSIS  Your caregiver will perform a skin exam to diagnose shingles. Skin scrapings or fluid samples may also be taken from the blisters. This sample will be examined under a microscope or sent to a lab for further testing. TREATMENT  There is no specific cure for shingles. Your caregiver will likely prescribe medicines to help you manage the pain, recover faster, and avoid long-term problems. This may include antiviral drugs, anti-inflammatory drugs, and pain medicines. HOME CARE INSTRUCTIONS   Take a cool bath or apply cool compresses to the area of the rash or blisters as directed. This may help with the pain and itching.   Only take over-the-counter or prescription medicines as directed by your caregiver.   Rest as directed by your caregiver.  Keep your rash and blisters clean with mild soap and cool water or as directed by your caregiver.  Do not pick your blisters or scratch your rash. Apply an anti-itch cream or numbing creams to the affected area as directed by your caregiver.  Keep your shingles rash covered with a loose bandage (dressing).  Avoid skin contact with:  Babies.   Pregnant women.   Children with eczema.   Elderly people with transplants.   People with chronic  illnesses, such as leukemia or AIDS.   Wear loose-fitting clothing to help ease the pain of material rubbing against the rash.  Keep all follow-up appointments with your caregiver.If the area involved is on your face, you may receive a referral for follow-up to a specialist, such as an eye doctor (ophthalmologist) or an ear, nose, and throat (ENT) doctor. Keeping all follow-up appointments will  help you avoid eye complications, chronic pain, or disability.  SEEK IMMEDIATE MEDICAL CARE IF:   You have facial pain, pain around the eye area, or loss of feeling on one side of your face.  You have ear pain or ringing in your ear.  You have loss of taste.  Your pain is not relieved with prescribed medicines.   Your redness or swelling spreads.   You have more pain and swelling.  Your condition is worsening or has changed.   You have a feveror persistent symptoms for more than 2 3 days.  You have a fever and your symptoms suddenly get worse. MAKE SURE YOU:  Understand these instructions.  Will watch your condition.  Will get help right away if you are not doing well or get worse. Document Released: 04/03/2005 Document Revised: 12/27/2011 Document Reviewed: 11/16/2011 [facility] Patient Information 2014 [place].

## 2013-07-22 ENCOUNTER — Encounter: Payer: Self-pay | Admitting: *Deleted

## 2013-08-05 ENCOUNTER — Other Ambulatory Visit: Payer: Self-pay | Admitting: Family Medicine

## 2013-08-06 ENCOUNTER — Other Ambulatory Visit: Payer: Self-pay | Admitting: *Deleted

## 2013-08-06 MED ORDER — HYOSCYAMINE SULFATE 0.125 MG/ML PO SOLN: ORAL | Status: DC

## 2013-08-06 NOTE — Telephone Encounter (Signed)
[place] for both plus five ref

## 2013-09-06 ENCOUNTER — Other Ambulatory Visit: Payer: Self-pay | Admitting: Family Medicine

## 2013-09-18 ENCOUNTER — Other Ambulatory Visit: Payer: Self-pay

## 2013-09-18 ENCOUNTER — Ambulatory Visit
Admission: RE | Admit: 2013-09-18 | Discharge: 2013-09-18 | Disposition: A | Discharge status: Home / Self Care | Payer: BC Managed Care – PPO | Source: Ambulatory Visit

## 2013-09-18 DIAGNOSIS — Z1231 Encounter for screening mammogram for malignant neoplasm of breast: Secondary | ICD-10-CM

## 2013-09-26 ENCOUNTER — Ambulatory Visit (INDEPENDENT_AMBULATORY_CARE_PROVIDER_SITE_OTHER): Payer: BC Managed Care – PPO | Admitting: Family Medicine

## 2013-09-26 ENCOUNTER — Encounter: Payer: Self-pay | Admitting: Family Medicine

## 2013-09-26 VITALS — BP 120/80 | Temp 98.1°F | Ht 67.5 in | Wt 147.2 lb

## 2013-09-26 DIAGNOSIS — J069 Acute upper respiratory infection, unspecified: Secondary | ICD-10-CM

## 2013-09-26 DIAGNOSIS — J019 Acute sinusitis, unspecified: Secondary | ICD-10-CM

## 2013-09-26 MED ORDER — AMOXICILLIN-POT CLAVULANATE 875-125 MG PO TABS: 1.0000 | ORAL_TABLET | Freq: Two times a day (BID) | ORAL | Status: DC

## 2013-09-26 MED ORDER — BENZONATATE 100 MG PO CAPS: 200.0000 mg | ORAL_CAPSULE | Freq: Three times a day (TID) | ORAL | Status: DC | PRN

## 2013-09-26 NOTE — Progress Notes (Signed)
   Subjective:    Patient ID: Stephanie Armstrong, female    DOB: 10/07/1955, 57 y.o.   MRN: [id number]  Sore Throat  This is a new problem. The current episode started in the past 7 days. The problem has been unchanged. Neither side of throat is experiencing more pain than the other. There has been no fever. The pain is at a severity of 4/10. The pain is moderate. Associated symptoms include congestion and coughing. Pertinent negatives include no ear pain or shortness of breath. Treatments tried: oral decongestants. The treatment provided no relief.   Patient states that she has no other concerns at this time.    Review of Systems  Constitutional: Negative for fever and activity change.  HENT: Positive for congestion and rhinorrhea. Negative for ear pain.   Eyes: Negative for discharge.  Respiratory: Positive for cough. Negative for shortness of breath and wheezing.   Cardiovascular: Negative for chest pain.       Objective:   Physical Exam  Nursing note and vitals reviewed. Constitutional: She appears well-developed.  HENT:  Head: Normocephalic.  Nose: Nose normal.  Mouth/Throat: Oropharynx is clear and moist. No oropharyngeal exudate.  Neck: Neck supple.  Cardiovascular: Normal rate and normal heart sounds.   No murmur heard. Pulmonary/Chest: Effort normal and breath sounds normal. She has no wheezes.  Lymphadenopathy:    She has no cervical adenopathy.  Skin: Skin is warm and dry.          Assessment & Plan:  Upper respiratory illness with secondary sinusitis antibiotics prescribed warning signs discussed followup if ongoing troubles

## 2014-01-13 ENCOUNTER — Other Ambulatory Visit: Payer: Self-pay | Admitting: Family Medicine

## 2014-01-13 ENCOUNTER — Other Ambulatory Visit: Payer: Self-pay

## 2014-01-30 ENCOUNTER — Other Ambulatory Visit: Payer: Self-pay

## 2014-02-10 ENCOUNTER — Other Ambulatory Visit: Payer: Self-pay | Admitting: Family Medicine

## 2014-02-10 NOTE — Telephone Encounter (Signed)
One mo worth o v due

## 2014-02-10 NOTE — Telephone Encounter (Signed)
Last seen 09/26/13 for sick visit

## 2014-02-16 ENCOUNTER — Encounter: Payer: Self-pay | Admitting: Family Medicine

## 2014-03-14 ENCOUNTER — Other Ambulatory Visit: Payer: Self-pay | Admitting: Family Medicine

## 2014-04-14 ENCOUNTER — Other Ambulatory Visit: Payer: Self-pay | Admitting: Family Medicine

## 2014-04-14 ENCOUNTER — Telehealth: Payer: Self-pay

## 2014-04-14 NOTE — Telephone Encounter (Signed)
Pt is needing a script sent to the pharmacy for her shingles shot. Pt  Stated that the pharmacist said that due to her being under 60 she is Required to have a script.

## 2014-04-14 NOTE — Telephone Encounter (Signed)
Rx up front for patient pick up. Patient notified. 

## 2014-04-14 NOTE — Telephone Encounter (Signed)
Let's do, a lot of insur dont cover under 60

## 2014-04-28 ENCOUNTER — Other Ambulatory Visit: Payer: Self-pay | Admitting: Obstetrics and Gynecology

## 2014-04-30 LAB — CYTOLOGY - PAP

## 2014-05-14 ENCOUNTER — Other Ambulatory Visit: Payer: Self-pay | Admitting: Family Medicine

## 2014-06-13 ENCOUNTER — Other Ambulatory Visit: Payer: Self-pay | Admitting: Family Medicine

## 2014-07-02 ENCOUNTER — Encounter: Payer: Self-pay | Admitting: Family Medicine

## 2014-07-02 ENCOUNTER — Ambulatory Visit (INDEPENDENT_AMBULATORY_CARE_PROVIDER_SITE_OTHER): Payer: 59 | Admitting: Family Medicine

## 2014-07-02 VITALS — BP 110/80 | Ht 67.5 in | Wt 150.1 lb

## 2014-07-02 DIAGNOSIS — G4700 Insomnia, unspecified: Secondary | ICD-10-CM

## 2014-07-02 DIAGNOSIS — R5383 Other fatigue: Secondary | ICD-10-CM | POA: Diagnosis not present

## 2014-07-02 DIAGNOSIS — I10 Essential (primary) hypertension: Secondary | ICD-10-CM | POA: Diagnosis not present

## 2014-07-02 DIAGNOSIS — G5601 Carpal tunnel syndrome, right upper limb: Secondary | ICD-10-CM

## 2014-07-02 DIAGNOSIS — Z79899 Other long term (current) drug therapy: Secondary | ICD-10-CM | POA: Diagnosis not present

## 2014-07-02 DIAGNOSIS — G5600 Carpal tunnel syndrome, unspecified upper limb: Secondary | ICD-10-CM | POA: Insufficient documentation

## 2014-07-02 MED ORDER — AMLODIPINE BESY-BENAZEPRIL HCL 5-40 MG PO CAPS: ORAL_CAPSULE | ORAL | Status: DC

## 2014-07-02 MED ORDER — HYDROCHLOROTHIAZIDE 25 MG PO TABS: ORAL_TABLET | ORAL | Status: DC

## 2014-07-02 NOTE — Progress Notes (Signed)
   Subjective:    Patient ID: Stephanie Armstrong, female    DOB: 15-Oct-1955, 59 y.o.   MRN: [id number]  Hypertension This is a chronic problem. The current episode started more than 1 year ago. The problem has been gradually improving since onset. The problem is controlled. There are no associated agents to hypertension. There are no known risk factors for coronary artery disease. Treatments tried: amlodipine-benazapril. The current treatment provides significant improvement. There are no compliance problems.    Patient states that her right hand has a tingling feeling in it that comes and goes. Usually worse at night time. Can occur during the day. Wakes up at night with it numb.    None in left hand.    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 John with cold exposure.  This has been present for about 3 months now. As far as the right hand symptoms. Worse at night tingling numb painful  BP not cked elsewhere.  Still exercising but not as much as in the past  Overall eating healthy an d good diet     Review of Systems No headache no chest pain no back pain no abdominal pain no change in bowel habits no blood in stool    Objective:   Physical Exam Alert no major distress lungs clear. Heart regular in rhythm H&T normal negative Tinel sign negative Phalen sign.       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 Maintain blood pressure medication. Diet exercise discussed. Recheck in 6 months. WSL

## 2014-07-31 ENCOUNTER — Encounter: Payer: Self-pay | Admitting: Family Medicine

## 2014-07-31 LAB — TSH: TSH: 1.7 u[IU]/mL (ref 0.450–4.500)

## 2014-07-31 LAB — BASIC METABOLIC PANEL
BUN/Creatinine Ratio: 26 — ABNORMAL HIGH (ref 9–23)
BUN: 21 mg/dL (ref 6–24)
CO2: 24 mmol/L (ref 18–29)
Calcium: 9.6 mg/dL (ref 8.7–10.2)
Chloride: 100 mmol/L (ref 97–108)
Creatinine, Ser: 0.81 mg/dL (ref 0.57–1.00)
GFR calc Af Amer: 92 mL/min/{1.73_m2} (ref >59)
GFR calc non Af Amer: 80 mL/min/{1.73_m2} (ref >59)
Glucose: 91 mg/dL (ref 65–99)
Potassium: 4.1 mmol/L (ref 3.5–5.2)
Sodium: 140 mmol/L (ref 134–144)

## 2014-07-31 LAB — LIPID PANEL
Chol/HDL Ratio: 1.7 {ratio} (ref 0.0–4.4)
Cholesterol, Total: 187 mg/dL (ref 100–199)
HDL: 112 mg/dL (ref >39)
LDL Calculated: 65 mg/dL (ref 0–99)
Triglycerides: 51 mg/dL (ref 0–149)
VLDL Cholesterol Cal: 10 mg/dL (ref 5–40)

## 2014-07-31 LAB — HEPATIC FUNCTION PANEL
ALT: 14 [IU]/L (ref 0–32)
AST: 18 [IU]/L (ref 0–40)
Albumin: 4.4 g/dL (ref 3.5–5.5)
Alkaline Phosphatase: 64 [IU]/L (ref 39–117)
Bilirubin Total: 0.3 mg/dL (ref 0.0–1.2)
Bilirubin, Direct: 0.08 mg/dL (ref 0.00–0.40)
Total Protein: 6.5 g/dL (ref 6.0–8.5)

## 2014-08-17 ENCOUNTER — Other Ambulatory Visit: Payer: Self-pay | Admitting: Family Medicine

## 2014-08-17 NOTE — Telephone Encounter (Signed)
Ok plus four monthly ref

## 2014-08-26 ENCOUNTER — Telehealth: Payer: Self-pay | Admitting: Family Medicine

## 2014-08-26 NOTE — Telephone Encounter (Signed)
Left message to return call 

## 2014-08-26 NOTE — Telephone Encounter (Signed)
Letter mailed 07/31/14:   [employer], these results, including your thyroid test, are all very good, Dr Richardson Landry.

## 2014-08-26 NOTE — Telephone Encounter (Signed)
Patient is requesting the results to her labwork from 07/30/2014.

## 2014-08-28 NOTE — Telephone Encounter (Signed)
LMRC

## 2014-08-31 NOTE — Telephone Encounter (Signed)
Discussed with pt

## 2014-09-17 ENCOUNTER — Ambulatory Visit (INDEPENDENT_AMBULATORY_CARE_PROVIDER_SITE_OTHER): Payer: 59 | Admitting: Family Medicine

## 2014-09-17 ENCOUNTER — Encounter: Payer: Self-pay | Admitting: Family Medicine

## 2014-09-17 VITALS — BP 122/82 | Temp 97.6°F | Ht 67.5 in

## 2014-09-17 DIAGNOSIS — M25519 Pain in unspecified shoulder: Secondary | ICD-10-CM | POA: Diagnosis not present

## 2014-09-17 MED ORDER — NABUMETONE 750 MG PO TABS: 750.0000 mg | ORAL_TABLET | Freq: Two times a day (BID) | ORAL | Status: DC

## 2014-09-17 NOTE — Progress Notes (Signed)
   Subjective:    Patient ID: [name], female    DOB: 1956/03/18, 59 y.o.   MRN: [id number]  Neck Pain  This is a new problem. The current episode started more than 1 month ago. The problem occurs intermittently. The problem has been unchanged. The pain is associated with nothing. The pain is present in the left side. The quality of the pain is described as stabbing (clinching). The pain is moderate. The symptoms are aggravated by position. The pain is same all the time. Stiffness is present all day. She has tried NSAIDs for the symptoms. The treatment provided no relief.   Patient states that she would like to discuss the results of her recent blood work while she is here. Results are in the system.   Left sided neck painful and uncomfortable  Shoulder pain substantial. Not in the neck. No radiation to left arm. Deep ache at times. Recalls no injury. Several months duration No known injury   Review of Systems  Musculoskeletal: Positive for neck pain.       Objective:   Physical Exam Alert no acute distress. Vitals stable. Shoulder great range of motion neck range very good range of motion. Distal arm strength sensation all intact some deep supra scapular tenderness. Lungs clear heart rare rhythm       Assessment & Plan:  Impression supraclavicular/suprascapular muscle strain. Discussed length. No need for imaging test. 14 day trial of strong anti-inflammatory medicine local measures discussed WSL

## 2014-09-17 NOTE — Patient Instructions (Signed)
Results for orders placed or performed in visit on 07/02/14  Lipid panel  Result Value Ref Range   Cholesterol, Total 187 100 - 199 mg/dL   Triglycerides 51 0 - 149 mg/dL   HDL 112 >39 mg/dL   VLDL Cholesterol Cal 10 5 - 40 mg/dL   LDL Calculated 65 0 - 99 mg/dL   Chol/HDL Ratio 1.7 0.0 - 4.4 ratio units  Hepatic function panel  Result Value Ref Range   Total Protein 6.5 6.0 - 8.5 g/dL   Albumin 4.4 3.5 - 5.5 g/dL   Bilirubin Total 0.3 0.0 - 1.2 mg/dL   Bilirubin, Direct 0.08 0.00 - 0.40 mg/dL   Alkaline Phosphatase 64 39 - 117 IU/L   AST 18 0 - 40 IU/L   ALT 14 0 - 32 IU/L  Basic metabolic panel  Result Value Ref Range   Glucose 91 65 - 99 mg/dL   BUN 21 6 - 24 mg/dL   Creatinine, Ser 0.81 0.57 - 1.00 mg/dL   GFR calc non Af Amer 80 >59 mL/min/1.73   GFR calc Af Amer 92 >59 mL/min/1.73   BUN/Creatinine Ratio 26 (H) 9 - 23   Sodium 140 134 - 144 mmol/L   Potassium 4.1 3.5 - 5.2 mmol/L   Chloride 100 97 - 108 mmol/L   CO2 24 18 - 29 mmol/L   Calcium 9.6 8.7 - 10.2 mg/dL  TSH  Result Value Ref Range   TSH 1.700 0.450 - 4.500 uIU/mL

## 2014-12-04 ENCOUNTER — Other Ambulatory Visit: Payer: Self-pay | Admitting: Family Medicine

## 2015-01-06 ENCOUNTER — Encounter: Payer: Self-pay | Admitting: Family Medicine

## 2015-01-06 ENCOUNTER — Ambulatory Visit (INDEPENDENT_AMBULATORY_CARE_PROVIDER_SITE_OTHER): Payer: 59 | Admitting: Family Medicine

## 2015-01-06 VITALS — BP 138/82 | Ht 67.5 in | Wt 150.0 lb

## 2015-01-06 DIAGNOSIS — I10 Essential (primary) hypertension: Secondary | ICD-10-CM

## 2015-01-06 DIAGNOSIS — Z23 Encounter for immunization: Secondary | ICD-10-CM

## 2015-01-06 DIAGNOSIS — G4700 Insomnia, unspecified: Secondary | ICD-10-CM | POA: Diagnosis not present

## 2015-01-06 MED ORDER — HYDROCHLOROTHIAZIDE 25 MG PO TABS: ORAL_TABLET | ORAL | Status: DC

## 2015-01-06 MED ORDER — ALPRAZOLAM 1 MG PO TABS: ORAL_TABLET | ORAL | Status: DC

## 2015-01-06 MED ORDER — AMLODIPINE BESY-BENAZEPRIL HCL 5-40 MG PO CAPS: ORAL_CAPSULE | ORAL | Status: DC

## 2015-01-06 NOTE — Progress Notes (Signed)
   Subjective:    Patient ID: Stephanie Armstrong, female    DOB: 1955-10-21, 59 y.o.   MRN: [id number]  Hypertension This is a chronic problem. The current episode started more than 1 year ago. Risk factors for coronary artery disease include post-menopausal state. Treatments tried: lotrel, hctz. There are no compliance problems.     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    Review of Systems No headache no chest pain no back pain no abdominal pain no change in bowel habits    Objective:   Physical Exam Alert vitals stable HEENT normal. Lungs clear. Heart regular in rhythm blood pressure good on repeat       Assessment & Plan:  Impression 1 hypertension good control discussed #2 insomnia with occasional anxiety good control plan diet exercise discussed. Flu shot today. Maintain meds. Recheck in 6 months WSL

## 2015-01-26 ENCOUNTER — Other Ambulatory Visit: Payer: Self-pay | Admitting: Family Medicine

## 2015-01-26 NOTE — Telephone Encounter (Signed)
Ok six mo worth 

## 2015-03-03 ENCOUNTER — Encounter: Payer: Self-pay | Admitting: Family Medicine

## 2015-03-03 ENCOUNTER — Ambulatory Visit (INDEPENDENT_AMBULATORY_CARE_PROVIDER_SITE_OTHER): Payer: 59 | Admitting: Family Medicine

## 2015-03-03 VITALS — Ht 67.5 in | Wt 150.0 lb

## 2015-03-03 DIAGNOSIS — G5601 Carpal tunnel syndrome, right upper limb: Secondary | ICD-10-CM | POA: Diagnosis not present

## 2015-03-03 DIAGNOSIS — M25519 Pain in unspecified shoulder: Secondary | ICD-10-CM | POA: Diagnosis not present

## 2015-03-03 NOTE — Progress Notes (Signed)
   Subjective:    Patient ID: Stephanie Armstrong, female    DOB: 10-Oct-1955, 59 y.o.   MRN: [id number]  HPI Patient arrives for c/o numbness and pain in right hand-?carpel tunnel.  Deep ache worse at night, patient awake several times shot tonight. Has to shake right handout or move it. Have no difficulty with left hand.  Some weakness in the right hand times. Symptoms of numbness never involve the little finger. Next  Also notes neck and shoulder pain. Worse with certain motions. Has had that off-and-on for a year although worse in recent weeks. Has been doing a lot of lifting.  No headache no chest pain  Worse with gripping mouse or holding something  Right handed  Review of Systems No shortness breath no loss of consciousness no abdominal pain review systems otherwise negative    Objective:   Physical Exam  Alert vital stable HEENT neck supple mild posterior lateral tenderness lungs clear heart rare rhythm blood pressure good right hand strength intact sensation currently taking negative Phalen sign negative Tinel sign      Assessment & Plan:  Impression 1 probable carpal tunnel syndrome by history with unrelated neck shoulder pain. Trial of daily at bedtime faithful short wrist splints rationale discussed at length plan 25 minutes spent most in discussion. Hold off on [place] MRI nerve conduction studies etc. for now start daily at bedtime splint. If substantially improved one month maintain same if not give [place] call and we'll need further workup WSL

## 2015-07-01 ENCOUNTER — Ambulatory Visit: Payer: Self-pay | Admitting: Family Medicine

## 2015-07-06 ENCOUNTER — Other Ambulatory Visit: Payer: Self-pay | Admitting: Family Medicine

## 2015-07-07 ENCOUNTER — Ambulatory Visit (INDEPENDENT_AMBULATORY_CARE_PROVIDER_SITE_OTHER): Payer: BLUE CROSS/BLUE SHIELD | Admitting: Family Medicine

## 2015-07-07 ENCOUNTER — Encounter: Payer: Self-pay | Admitting: Family Medicine

## 2015-07-07 VITALS — BP 128/80 | Ht 67.5 in | Wt 147.4 lb

## 2015-07-07 DIAGNOSIS — K588 Other irritable bowel syndrome: Secondary | ICD-10-CM | POA: Diagnosis not present

## 2015-07-07 DIAGNOSIS — R5383 Other fatigue: Secondary | ICD-10-CM

## 2015-07-07 DIAGNOSIS — I10 Essential (primary) hypertension: Secondary | ICD-10-CM | POA: Diagnosis not present

## 2015-07-07 DIAGNOSIS — Z79899 Other long term (current) drug therapy: Secondary | ICD-10-CM | POA: Diagnosis not present

## 2015-07-07 DIAGNOSIS — G4700 Insomnia, unspecified: Secondary | ICD-10-CM | POA: Diagnosis not present

## 2015-07-07 MED ORDER — ALPRAZOLAM 1 MG PO TABS: ORAL_TABLET | ORAL | Status: DC

## 2015-07-07 MED ORDER — AMLODIPINE BESY-BENAZEPRIL HCL 5-40 MG PO CAPS: ORAL_CAPSULE | ORAL | Status: DC

## 2015-07-07 NOTE — Progress Notes (Signed)
   Subjective:    Patient ID: Stephanie Armstrong, female    DOB: 1956/03/25, 60 y.o.   MRN: [id number] Patient arrives office with several concerns. Hypertension This is a chronic problem. The current episode started more than 1 year ago. The problem has been gradually improving since onset. There are no associated agents to hypertension. There are no known risk factors for coronary artery disease. Treatments tried: amlodipine-benzapril. The current treatment provides moderate improvement. There are no compliance problems.    next  Also expansion abdominal concerns. Has a history of IBS in the past. Generally with looser stools. Worried about something more serious since her abdomen and bowel movements feel different crampong trpouble with bowel movements  Stool harder or softer  otc meds takes a probiotic  Mid April scheduled to se the specialist  Trouble with spasm, no colon ca, some hx of potential    Ongoing challenges with trouble sleeping. States that alprazolam definitely helps. No obvious side effects.    Review of Systems No headache no chest pain no back pain no change in bowel habits no blood in stool ROS otherwise negative    Objective:   Physical Exam  Alert vitals stable. HEENT normal. Lungs clear. Heart regular in rhythm. Abdomen soft diffuse mild tenderness no discrete tenderness no organomegaly      Assessment & Plan:  Impression 1 hypertension good control discussed to maintain same dose meds #2 insomnia ongoing substantial need for medicines discussed maintain same #3 very nonspecific GI symptomatology with patient fairly distressed about it and in need to get on back to her GI doctor discussed plan appropriate blood work. Medications refilled. On to GI specialist WSL

## 2015-07-13 ENCOUNTER — Telehealth: Payer: Self-pay | Admitting: Family Medicine

## 2015-07-13 LAB — HEPATIC FUNCTION PANEL
ALT: 14 [IU]/L (ref 0–32)
AST: 19 [IU]/L (ref 0–40)
Albumin: 4.2 g/dL (ref 3.6–4.8)
Alkaline Phosphatase: 58 [IU]/L (ref 39–117)
Bilirubin Total: 0.5 mg/dL (ref 0.0–1.2)
Bilirubin, Direct: 0.13 mg/dL (ref 0.00–0.40)
Total Protein: 6.3 g/dL (ref 6.0–8.5)

## 2015-07-13 LAB — BASIC METABOLIC PANEL
BUN/Creatinine Ratio: 29 — ABNORMAL HIGH (ref 11–26)
BUN: 21 mg/dL (ref 8–27)
CO2: 26 mmol/L (ref 18–29)
Calcium: 9.4 mg/dL (ref 8.7–10.3)
Chloride: 103 mmol/L (ref 96–106)
Creatinine, Ser: 0.73 mg/dL (ref 0.57–1.00)
GFR calc Af Amer: 104 mL/min/{1.73_m2} (ref >59)
GFR calc non Af Amer: 90 mL/min/{1.73_m2} (ref >59)
Glucose: 92 mg/dL (ref 65–99)
Potassium: 4.8 mmol/L (ref 3.5–5.2)
Sodium: 140 mmol/L (ref 134–144)

## 2015-07-13 LAB — LIPID PANEL
Chol/HDL Ratio: 2 {ratio} (ref 0.0–4.4)
Cholesterol, Total: 185 mg/dL (ref 100–199)
HDL: 93 mg/dL (ref >39)
LDL Calculated: 82 mg/dL (ref 0–99)
Triglycerides: 51 mg/dL (ref 0–149)
VLDL Cholesterol Cal: 10 mg/dL (ref 5–40)

## 2015-07-13 LAB — TSH: TSH: 1.3 u[IU]/mL (ref 0.450–4.500)

## 2015-07-13 NOTE — Telephone Encounter (Signed)
See result note: Results discussed with patient. Patient advised her all of her blood work is excellent last component just came back this morning. Patient verbalized understanding.

## 2015-07-13 NOTE — Telephone Encounter (Signed)
Pt called wanting to know the results of her recent lab work.

## 2015-07-13 NOTE — Telephone Encounter (Signed)
See lab message and give results to pt

## 2015-07-27 ENCOUNTER — Ambulatory Visit (INDEPENDENT_AMBULATORY_CARE_PROVIDER_SITE_OTHER): Payer: BLUE CROSS/BLUE SHIELD | Admitting: Internal Medicine

## 2015-07-27 ENCOUNTER — Encounter: Payer: Self-pay | Admitting: Internal Medicine

## 2015-07-27 VITALS — BP 124/86 | HR 60 | Ht 67.5 in | Wt 146.0 lb

## 2015-07-27 DIAGNOSIS — R194 Change in bowel habit: Secondary | ICD-10-CM | POA: Diagnosis not present

## 2015-07-27 NOTE — Progress Notes (Signed)
HISTORY OF PRESENT ILLNESS:  Stephanie Armstrong is a 60 y.o. female with history of hypertension, anxiety, and irritable bowel syndrome. I saw the patient on 1 occasion 08/29/2012 for routine screening colonoscopy. Examination was normal. She is referred today by her primary care provider Dr. Mickie Hillier with a chief complaint of change in bowel habits. Patient reports being diagnosed with irritable bowel syndrome as far back as her teenage years. She describes issues with abdominal cramping, urgency, and loose stools after meals and during periods of stress. She has continued with this to some degree until about 2 months ago when she noticed that her bowel movements are less frequent that her stool caliber was thin. She describes a vague sensation for defecatory urge with incomplete evacuation. Her bowel frequency has changed from once or twice daily to once every 4-5 days. Despite this, she denies abdominal pain, bloating, rectal pain, rectal bleeding, incontinence, or significant weight change. She has been reading Internet concern about multiple potential diagnoses. She is status post vaginal hysterectomy and has had delivery 3. She denies urinary complaints. She tells me that she has not coming gynecology evaluation planned. Recent blood work with her PCP including comprehensive metabolic panel, but profile, and TSH were unremarkable.  REVIEW OF SYSTEMS:  All non-GI ROS negative except for anxiety  Past Medical History  Diagnosis Date  . Hypertension   . Anxiety   . IBS (irritable bowel syndrome)     Past Surgical History  Procedure Laterality Date  . Abdominal hysterectomy  1999    VAGINAL HYSTERECTOMY   . Augmentation mammaplasty  APRIL 2013    BREAST IMPLANT CHANGED    Social History [name]  reports that she has never smoked. She has never used smokeless tobacco. She reports that she drinks alcohol. She reports that she does not use illicit drugs.  family history  includes Breast cancer in her paternal grandmother; Cancer in her father and sister; Diabetes in her father; Heart disease in her father; Hypertension in her father and mother. There is no history of Colon cancer.  No Known Allergies     PHYSICAL EXAMINATION: Vital signs: BP 124/86 mmHg  Pulse 60  Ht 5' 7.5" (1.715 m)  Wt 146 lb (66.225 kg)  BMI 22.52 kg/m2  Constitutional: generally well-appearing, no acute distress Psychiatric: alert and oriented x3, cooperative Eyes: extraocular movements intact, anicteric, conjunctiva pink Mouth: oral pharynx moist, no lesions Neck: supple no lymphadenopathy Cardiovascular: heart regular rate and rhythm, no murmur Lungs: clear to auscultation bilaterally Abdomen: soft, nontender, nondistended, no obvious ascites, no peritoneal signs, normal bowel sounds, no organomegaly Rectal:No external abnormalities. No tenderness or mass. The stool is soft brown and Hemoccult negative. Pelvic floor descent is slightly decreased as his rectal tone. Perianal sensation intact Extremities: no clubbing cyanosis or lower extremity edema bilaterally Skin: no lesions on visible extremities Neuro: No focal deficits. Cranial nerves intact. Normal DTRs  ASSESSMENT:  #1. Nonspecific change in bowel habits as described. No worrisome features. No associated clinical symptoms bothersome to the patient. She may have slight pelvic floor to sent. Would only plan observation at this time. Reevaluation for the development of worrisome signs or bothersome symptoms #2. History of irritable bowel syndrome #3. Normal colonoscopy May 2014 #4. Health related anxiety   PLAN:  #1. Reassurance #2. Try daily fiber supplementation such as noted use or Citrucel 1-2 tablespoons #3. Keep GYN appointment as planned #4. Screening colonoscopy around 2024. Interval GI follow-up as needed  A  copy of this consultation note has been sent to Dr. Wolfgang Phoenix

## 2015-07-27 NOTE — Patient Instructions (Signed)
Please follow up as needed 

## 2015-08-03 DIAGNOSIS — L570 Actinic keratosis: Secondary | ICD-10-CM | POA: Diagnosis not present

## 2015-08-11 DIAGNOSIS — F419 Anxiety disorder, unspecified: Secondary | ICD-10-CM | POA: Insufficient documentation

## 2015-08-11 DIAGNOSIS — N819 Female genital prolapse, unspecified: Secondary | ICD-10-CM | POA: Diagnosis not present

## 2015-08-11 DIAGNOSIS — M9420 Chondromalacia, unspecified site: Secondary | ICD-10-CM | POA: Insufficient documentation

## 2015-08-23 DIAGNOSIS — N816 Rectocele: Secondary | ICD-10-CM | POA: Diagnosis not present

## 2015-08-23 DIAGNOSIS — N8110 Cystocele, unspecified: Secondary | ICD-10-CM | POA: Diagnosis not present

## 2015-09-06 ENCOUNTER — Encounter: Payer: Self-pay | Admitting: Family Medicine

## 2015-09-06 ENCOUNTER — Ambulatory Visit (INDEPENDENT_AMBULATORY_CARE_PROVIDER_SITE_OTHER): Payer: BLUE CROSS/BLUE SHIELD | Admitting: Family Medicine

## 2015-09-06 VITALS — BP 124/82 | Ht 67.5 in | Wt 145.4 lb

## 2015-09-06 DIAGNOSIS — N816 Rectocele: Secondary | ICD-10-CM | POA: Diagnosis not present

## 2015-09-06 DIAGNOSIS — R21 Rash and other nonspecific skin eruption: Secondary | ICD-10-CM | POA: Diagnosis not present

## 2015-09-06 MED ORDER — KETOCONAZOLE 2 % EX CREA: 1.0000 "application " | TOPICAL_CREAM | Freq: Two times a day (BID) | CUTANEOUS | Status: DC

## 2015-09-06 NOTE — Progress Notes (Signed)
   Subjective:    Patient ID: Stephanie Armstrong, female    DOB: 13-Jul-1955, 60 y.o.   MRN: [id number] Patient arrives office for protracted discussion regarding her medical concerns HPI Please see prior note in recent months had noticed change in bowel habits. Was concerned about this.  Patient saw the GI folks. They did a thorough history and assessment. Due to a colonoscopy 3 years ago they recommended no repeat colonoscopy at this time. Patient was advised pelvic muscle tone may not be the best next  One-on-one saw a GYN PA. They felt she had rectocele and possible element of cystocele. Then saw the GYN specialist. They were concerned about a couple vaginal lesions and set her up for biopsy.  Patient arrives now with all this pending and wonders if she is doing the right thing.  Also notes a rash pelvic skin no obvious pruritus appears to be more significant after shower Patient in today to discuss results  from GYN, and Gastro.   Pt saw the g i, who felt her g i situation was stable  Last colonoscopy in 2014  Pt had prolapse and rectocele. On exam, this led to exam by gyn specialist and concern re a possible urethral   Notes difference in skin tone    Pt went on and saw specialist  Has concerns of discoloration  to lower abdomen, and groin area.    Review of Systems No headache, no major weight loss or weight gain, no chest pain no back pain abdominal pain no change in bowel habits complete ROS otherwise negative     Objective:   Physical Exam Alert vital stable lungs clear heart rare rhythm H&T normal groin tenia versicolor rash noted abdomen benign       Assessment & Plan:  Impression 1 tenia versicolor rash discussed #2 rectocele with probable need for surgical intervention due to change in stool habits #3 vaginal lesion awaiting biopsy with substantial anal side in this regard #4 GI specialist who actually recommended no colonoscopy. Discussed with patient. I think  this advice as good considering colonoscopy was squeaky clean 3 years ago and the chance of developing an intraluminal lesion in such a short amount of time to affect stools is extremely low. Ketoconazole cream twice a day to affected area. Persists with surgical/specialty workup. 25 minutes spent most in discussion WSL

## 2015-09-06 NOTE — Patient Instructions (Signed)
Tinea Versicolor Tinea versicolor is a common fungal infection of the skin. It causes a rash that appears as light or dark patches on the skin. The rash most often occurs on the chest, back, neck, or upper arms. This condition is more common during warm weather. Other than affecting how your skin looks, tinea versicolor usually does not cause other problems. In most cases, the infection goes away in a few weeks with treatment. It may take a few months for the patches on your skin to clear up. CAUSES Tinea versicolor occurs when a type of fungus that is normally present on the skin starts to overgrow. This fungus is a kind of yeast. The exact cause of the overgrowth is not known. This condition cannot be passed from one person to another (noncontagious). RISK FACTORS This condition is more likely to develop when certain factors are present, such as:  Heat and humidity.  Sweating too much.  Hormone changes.  Oily skin.  A weak defense (immune) system. SYMPTOMS Symptoms of this condition may include:  A rash on your skin that is made up of light or dark patches. The rash may have:  Patches of tan or pink spots on light skin.  Patches of white or brown spots on dark skin.  Patches of skin that do not tan.  Well-marked edges.  Scales on the discolored areas.  Mild itching. DIAGNOSIS A health care provider can usually diagnose this condition by looking at your skin. During the exam, he or she may use ultraviolet light to help determine the extent of the infection. In some cases, a skin sample may be taken by scraping the rash. This sample will be viewed under a microscope to check for yeast overgrowth. TREATMENT Treatment for this condition may include:  Dandruff shampoo that is applied to the affected skin during showers or bathing.  Over-the-counter medicated skin cream, lotion, or soaps.  Prescription antifungal medicine in the form of skin cream or pills.  Medicine to help  reduce itching. HOME CARE INSTRUCTIONS  Take medicines only as directed by your health care provider.  Apply dandruff shampoo to the affected area if told to do so by your health care provider. You may be instructed to scrub the affected skin for several minutes each day.  Do not scratch the affected area of skin.  Avoid hot and humid conditions.  Do not use tanning booths.  Try to avoid sweating a lot. SEEK MEDICAL CARE IF:  Your symptoms get worse.  You have a fever.  You have redness, swelling, or pain at the site of your rash.  You have fluid, blood, or pus coming from your rash.  Your rash returns after treatment.   This information is not intended to replace advice given to you by your health care provider. Make sure you discuss any questions you have with your health care provider.   Document Released: 03/31/2000 Document Revised: 04/24/2014 Document Reviewed: 01/13/2014 Elsevier Interactive Patient Education 2016 [employer]  

## 2015-09-10 ENCOUNTER — Other Ambulatory Visit: Payer: Self-pay | Admitting: Obstetrics and Gynecology

## 2015-09-10 DIAGNOSIS — N909 Noninflammatory disorder of vulva and perineum, unspecified: Secondary | ICD-10-CM | POA: Diagnosis not present

## 2015-09-10 DIAGNOSIS — N891 Moderate vaginal dysplasia: Secondary | ICD-10-CM | POA: Diagnosis not present

## 2015-09-22 ENCOUNTER — Ambulatory Visit (INDEPENDENT_AMBULATORY_CARE_PROVIDER_SITE_OTHER): Payer: BLUE CROSS/BLUE SHIELD | Admitting: Obstetrics and Gynecology

## 2015-09-22 ENCOUNTER — Encounter: Payer: Self-pay | Admitting: Obstetrics and Gynecology

## 2015-09-22 VITALS — BP 110/66 | HR 76 | Resp 18 | Ht 66.25 in | Wt 144.4 lb

## 2015-09-22 DIAGNOSIS — R159 Full incontinence of feces: Secondary | ICD-10-CM | POA: Diagnosis not present

## 2015-09-22 DIAGNOSIS — N891 Moderate vaginal dysplasia: Secondary | ICD-10-CM

## 2015-09-22 DIAGNOSIS — N369 Urethral disorder, unspecified: Secondary | ICD-10-CM

## 2015-09-22 DIAGNOSIS — N816 Rectocele: Secondary | ICD-10-CM | POA: Diagnosis not present

## 2015-09-22 DIAGNOSIS — R3915 Urgency of urination: Secondary | ICD-10-CM | POA: Diagnosis not present

## 2015-09-22 DIAGNOSIS — IMO0002 Reserved for concepts with insufficient information to code with codable children: Secondary | ICD-10-CM

## 2015-09-22 DIAGNOSIS — N8110 Cystocele, unspecified: Secondary | ICD-10-CM

## 2015-09-22 NOTE — Progress Notes (Signed)
Patient ID: Stephanie Armstrong, female   DOB: 09-29-1955, 60 y.o.   MRN: [id number] GYNECOLOGY  VISIT   HPI: 60 y.o.   Widowed  Caucasian  female   G3P3 with No LMP recorded. Patient has had a hysterectomy.   here for second opinion regarding urinary urge/incontinence and patient complains of thin/flat stools which frequencly has to manually help herself empty.   Symptoms onset in late March 2017.   Patient experiencing need to have bowel movements and not feeling the urge.  States the caliber has become smaller.   Saw Dr. Henrene Pastor, her GI, and told she was normal.  Told she had a prior normal colonoscopy in 2014. States she is doing an enema every 10 days.  Using Miralax daily.  Does manual manipulation to have BMs.  Has leakage of liquid stool.  Feels like she does not really have fecal incontinence.  Looses control of gas as well.  Feels her rectum is not as tight as it used to be.   Saw Dr. Mancel Bale who confirmed the presence of a rectocele and possible cystocele.  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  Feels like she voids well.  No leak of urine with cough or sneeze.  Hard to hold her urine.  Voids often but feels this is not a change. No leak for no reason.  DF - every 2 hours.  NF - once per night.  No enuresis.   Drinks a lot of water.  Caffeine - 5 cups coffee per day.  Wine 4 - 6 per week.   Status vaginal hysterectomy for fibroids in [place].  Still has her ovaries.   GYNECOLOGIC HISTORY: No LMP recorded. Patient has had a hysterectomy. Contraception:  Tubal/Hysterectomy--ovaries remain Menopausal hormone therapy:  n/a Last mammogram:  09-20-13 Saline Implants in 2013/3D/Density C/Neg/BiRads1:[facility] Last pap smear:  04-28-14 - Neg.         OB History    Gravida Para Term Preterm AB TAB SAB Ectopic Multiple Living   3 3        3          Patient Active  Problem List   Diagnosis Date Noted  . Carpal tunnel syndrome 07/02/2014  . Depression 01/12/2013  . Essential hypertension, benign 01/12/2013  . 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 Easy bruisability 06/26/2012  . Menopause 06/26/2012  . Insomnia 06/26/2012    Past Medical History  Diagnosis Date  . Hypertension   . Anxiety   . IBS (irritable bowel syndrome)     Past Surgical History  Procedure Laterality Date  . Augmentation mammaplasty  APRIL 2013    BREAST IMPLANT CHANGED  . Abdominal hysterectomy  1999    VAGINAL HYSTERECTOMY --ovaries remain  . Tubal ligation      Current Outpatient Prescriptions  Medication Sig Dispense Refill  . ALPRAZolam (XANAX) 1 MG tablet TAKE 1 TO 1 &1/2 TABLETS AT BEDTIME AS NEEDED . MAY FILL MONTHLY. 45 tablet 5  . amLODipine-benazepril (LOTREL) 5-40 MG capsule TAKE (1) CAPSULE BY MOUTH ONCE DAILY. 30 capsule 5  . Biotin 1000 MCG tablet Take 1,000 mcg by mouth daily.    . hydrochlorothiazide (HYDRODIURIL) 25 MG tablet TAKE ONE TABLET BY MOUTH ONCE DAILY AS NEEDED FOR SWELLING. DO NOT EXCEED 3 PER WEEK. 30 tablet 5  . ibuprofen (ADVIL,MOTRIN) 600 MG tablet Take  600 mg by mouth every 6 (six) hours as needed for pain.    Marland Kitchen ketoconazole (NIZORAL) 2 % cream Apply 1 application topically 2 (two) times daily. 60 g 4  . Magnesium 200 MG TABS Take 1 tablet by mouth daily.    . polyethylene glycol (MIRALAX / GLYCOLAX) packet Take 17 g by mouth daily as needed.    . Probiotic Product (PROBIOTIC DAILY PO) Take by mouth.     No current facility-administered medications for this visit.     ALLERGIES: Review of patient's allergies indicates no known allergies.  Family History  Problem Relation Age of Onset  . Hypertension Mother   . Cancer Father     MELANOMA  . Diabetes Father   . Hypertension Father   . Heart disease Father   . Cancer Sister     THYROID  . Breast cancer Paternal Grandmother   . Colon cancer Neg Hx     Social  History   Social History  . Marital Status: Widowed    Spouse Name: N/A  . Number of Children: N/A  . Years of Education: N/A   Occupational History  . Not on file.   Social History Main Topics  . Smoking status: Never Smoker   . Smokeless tobacco: Never Used  . Alcohol Use: 2.4 - 3.6 oz/week    4-6 Standard drinks or equivalent per week     Comment: [place]  . Drug Use: No  . Sexual Activity:    Partners: Male     Comment: Tubal/TVH--ovaries remain   Other Topics Concern  . Not on file   Social History Narrative    ROS:  Pertinent items are noted in HPI.  PHYSICAL EXAMINATION:    BP 110/66 mmHg  Pulse 76  Resp 18  Ht 5' 6.25" (1.683 m)  Wt 144 lb 6.4 oz (65.499 kg)  BMI 23.12 kg/m2    General appearance: alert, cooperative and appears stated age Head: Normocephalic, without obvious abnormality, atraumatic Neck: no adenopathy, supple, symmetrical, trachea midline and thyroid normal to inspection and palpation Lungs: clear to auscultation bilaterally  Heart: regular rate and rhythm Abdomen: incision(s):Yes.  , ___laparoscopic_________  soft, non-tender, no masses,  no organomegaly Extremities: extremities normal, atraumatic, no cyanosis or edema Skin: Skin color, texture, turgor normal. No rashes or lesions Lymph nodes: Cervical, supraclavicular normal. 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               Bartholins and Skenes: normal                 Vagina: normal appearing vagina with normal color and discharge, no lesions.  Second degree cystocele and rectocele.   Mild erythema of right vaginal cuff.               Cervix: absent            Bimanual Exam:  Uterus:  uterus absent              Adnexa: no mass, fullness, tenderness              Rectal exam: Yes.  .  Confirms.              Anus:  normal sphincter tone, no  lesions  Chaperone was present for exam.  ASSESSMENT  Status post LAVH.  Ovaries remain.  Cystocele. Urinary urgency. 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 Rectocele.  Fecal incontinence. VAIN II.  PLAN  Get records for VAIN II.   Likely needs a colposcopy.  Referral to Dr. Matilde Sprang for urinary urgency and potential urethral lesion.  This may all be just extensive urethral prolapse. Reduce bladder irritants.  Referral to Dr. Leighton Ruff regarding fecal incontinence.  Can address surgical care of cystocele and rectocele after the above issues are also addressed so patient can have a comprehensive surgical plan to the extent that this is possible.   An After Visit Summary was printed and given to the patient.  __45____ minutes face to face time of which over 50% was spent in counseling.

## 2015-09-23 ENCOUNTER — Other Ambulatory Visit: Payer: Self-pay | Admitting: Obstetrics and Gynecology

## 2015-09-23 DIAGNOSIS — Z1231 Encounter for screening mammogram for malignant neoplasm of breast: Secondary | ICD-10-CM

## 2015-10-08 ENCOUNTER — Telehealth: Payer: Self-pay | Admitting: Obstetrics and Gynecology

## 2015-10-08 ENCOUNTER — Encounter: Payer: Self-pay | Admitting: Obstetrics and Gynecology

## 2015-10-08 ENCOUNTER — Ambulatory Visit
Admission: RE | Admit: 2015-10-08 | Discharge: 2015-10-08 | Disposition: A | Discharge status: Home / Self Care | Payer: BLUE CROSS/BLUE SHIELD | Source: Ambulatory Visit | Attending: Obstetrics and Gynecology | Admitting: Obstetrics and Gynecology

## 2015-10-08 DIAGNOSIS — Z1231 Encounter for screening mammogram for malignant neoplasm of breast: Secondary | ICD-10-CM | POA: Diagnosis not present

## 2015-10-08 DIAGNOSIS — N891 Moderate vaginal dysplasia: Secondary | ICD-10-CM

## 2015-10-08 NOTE — Telephone Encounter (Signed)
Call to patient, left message to call back. 

## 2015-10-08 NOTE — Telephone Encounter (Signed)
Records received from prior GYN office.  Biopsies of the left and right labia minor showing VAIN II.   I am recommending an appointment with me for pap of vaginal and a colposcopy of the vagina and vulva  Thank you.

## 2015-10-08 NOTE — Telephone Encounter (Signed)
Return call to patient. Advised of results and recommendation from Dr Quincy Simmonds. Patient anxious to proceed with evaluation. Appointment scheduled for 10-11-15 at 1130 with Dr Quincy Simmonds.  Routing to provider for final review. Patient agreeable to disposition. Will close encounter.

## 2015-10-08 NOTE — Telephone Encounter (Signed)
Patient is returning a call to Sally. °

## 2015-10-11 ENCOUNTER — Ambulatory Visit (INDEPENDENT_AMBULATORY_CARE_PROVIDER_SITE_OTHER): Payer: BLUE CROSS/BLUE SHIELD | Admitting: Obstetrics and Gynecology

## 2015-10-11 ENCOUNTER — Other Ambulatory Visit: Payer: Self-pay | Admitting: Obstetrics and Gynecology

## 2015-10-11 VITALS — BP 112/60 | HR 74 | Resp 14 | Ht 66.25 in | Wt 146.6 lb

## 2015-10-11 DIAGNOSIS — N9089 Other specified noninflammatory disorders of vulva and perineum: Secondary | ICD-10-CM | POA: Diagnosis not present

## 2015-10-11 DIAGNOSIS — N816 Rectocele: Secondary | ICD-10-CM

## 2015-10-11 DIAGNOSIS — R87610 Atypical squamous cells of undetermined significance on cytologic smear of cervix (ASC-US): Secondary | ICD-10-CM | POA: Diagnosis not present

## 2015-10-11 DIAGNOSIS — D1801 Hemangioma of skin and subcutaneous tissue: Secondary | ICD-10-CM | POA: Diagnosis not present

## 2015-10-11 DIAGNOSIS — N369 Urethral disorder, unspecified: Secondary | ICD-10-CM

## 2015-10-11 DIAGNOSIS — R159 Full incontinence of feces: Secondary | ICD-10-CM

## 2015-10-11 DIAGNOSIS — N8110 Cystocele, unspecified: Secondary | ICD-10-CM

## 2015-10-11 DIAGNOSIS — IMO0002 Reserved for concepts with insufficient information to code with codable children: Secondary | ICD-10-CM

## 2015-10-11 DIAGNOSIS — R350 Frequency of micturition: Secondary | ICD-10-CM | POA: Diagnosis not present

## 2015-10-11 DIAGNOSIS — N904 Leukoplakia of vulva: Secondary | ICD-10-CM | POA: Diagnosis not present

## 2015-10-11 DIAGNOSIS — N891 Moderate vaginal dysplasia: Secondary | ICD-10-CM | POA: Diagnosis not present

## 2015-10-11 DIAGNOSIS — N952 Postmenopausal atrophic vaginitis: Secondary | ICD-10-CM | POA: Diagnosis not present

## 2015-10-11 DIAGNOSIS — R351 Nocturia: Secondary | ICD-10-CM | POA: Diagnosis not present

## 2015-10-11 DIAGNOSIS — N761 Subacute and chronic vaginitis: Secondary | ICD-10-CM | POA: Diagnosis not present

## 2015-10-11 NOTE — Progress Notes (Signed)
GYNECOLOGY  VISIT   HPI: 60 y.o.   Widowed  Caucasian  female   G3P3 with No LMP recorded. Patient has had a hysterectomy.   here for colpo and pap smear.    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   Did see Dr. Matilde Sprang today regarding her urethral lesion. He did a cystoscopy which was apparently normal. He will be conferring with his colleague(s) and suggested she may need potential removal of the area in the OR.  Patient has not yet seen Dr. Marcello Moores regarding fecal incontinence.  GYNECOLOGIC HISTORY: No LMP recorded. Patient has had a hysterectomy. Contraception:  Hysterectomy Menopausal hormone therapy:  None Last mammogram:  10/11/15-BIRADS1 Last pap smear:  04/28/14 WNL        OB History    Gravida Para Term Preterm AB TAB SAB Ectopic Multiple Living   3 3        3          Patient Active Problem List   Diagnosis Date Noted  . Carpal tunnel syndrome 07/02/2014  . Depression 01/12/2013  . Essential hypertension, benign 01/12/2013  . 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 Easy bruisability 06/26/2012  . Menopause 06/26/2012  . Insomnia 06/26/2012    Past Medical History  Diagnosis Date  . Hypertension   . Anxiety   . IBS (irritable bowel syndrome)   . Urinary incontinence   . 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     Past Surgical History  Procedure Laterality Date  . Augmentation mammaplasty  APRIL 2013    BREAST IMPLANT CHANGED  . Abdominal hysterectomy  1999    VAGINAL HYSTERECTOMY --ovaries remain  . Tubal ligation      Current Outpatient Prescriptions  Medication Sig Dispense Refill  . ALPRAZolam (XANAX) 1 MG tablet TAKE 1 TO 1 &1/2 TABLETS AT BEDTIME AS NEEDED . MAY FILL MONTHLY. 45 tablet 5  . amLODipine-benazepril (LOTREL) 5-40 MG capsule TAKE (1) CAPSULE BY MOUTH ONCE DAILY. 30 capsule 5  . Biotin 1000 MCG tablet Take 1,000 mcg by mouth  daily.    . hydrochlorothiazide (HYDRODIURIL) 25 MG tablet TAKE ONE TABLET BY MOUTH ONCE DAILY AS NEEDED FOR SWELLING. DO NOT EXCEED 3 PER WEEK. 30 tablet 5  . ibuprofen (ADVIL,MOTRIN) 600 MG tablet Take 600 mg by mouth every 6 (six) hours as needed for pain.    Marland Kitchen ketoconazole (NIZORAL) 2 % cream Apply 1 application topically 2 (two) times daily. 60 g 4  . Magnesium 200 MG TABS Take 1 tablet by mouth daily.    . polyethylene glycol (MIRALAX / GLYCOLAX) packet Take 17 g by mouth daily as needed.    . Probiotic Product (PROBIOTIC DAILY PO) Take by mouth.     No current facility-administered medications for this visit.     ALLERGIES: Review of patient's allergies indicates no known allergies.  Family History  Problem Relation Age of Onset  . Hypertension Mother   . Cancer Father     MELANOMA  . Diabetes Father   . Hypertension Father   . Heart disease Father   . Cancer Sister     THYROID  . Breast cancer Paternal Grandmother   . Colon cancer Neg Hx     Social History   Social History  . Marital Status: Widowed    Spouse Name: N/A  . Number of Children: N/A  .  Years of Education: N/A   Occupational History  . Not on file.   Social History Main Topics  . Smoking status: Never Smoker   . Smokeless tobacco: Never Used  . Alcohol Use: 2.4 - 3.6 oz/week    4-6 Standard drinks or equivalent per week     Comment: [place]  . Drug Use: No  . Sexual Activity:    Partners: Male     Comment: Tubal/TVH--ovaries remain   Other Topics Concern  . Not on file   Social History Narrative    ROS:  Pertinent items are noted in HPI.  PHYSICAL EXAMINATION:    BP 112/60 mmHg  Pulse 74  Resp 14  Ht 5' 6.25" (1.683 m)  Wt 146 lb 9.6 oz (66.497 kg)  BMI 23.48 kg/m2    General appearance: alert, cooperative and appears stated age    ASSESSMENT  VIN II.  Urethral lesion. Vulvar lesions. Cystocele and Rectocele. Fecal incontinence.  PLAN  Colposcopy performed today with  pap and HR HPV and also biopsies of the vaginal cuff and vulva.  See separate note for today for colposcopy findings. Follow up biopsies and pap.  All specimens were sent to [facility] today.  Instructions and precautions given.  Follow up with urology regarding urethral lesion.  Follow up with colon and rectal surgery regarding fecal incontinence.  Prolapse repair to follow.    An After Visit Summary was printed and given to the patient.  __15____ minutes face to face time of which over 50% was spent in counseling.

## 2015-10-12 LAB — CYTOLOGY - PAP

## 2015-10-13 NOTE — Progress Notes (Signed)
Subjective:     Patient ID: Stephanie Armstrong, female   DOB: 1955-09-15, 60 y.o.   MRN: [id number]  HPI  Patient is here today for colposcopy of vagina and vulva due to VAIN II noted at outside office.  VAIN II is really VIN II.  Biopsies are noted to be the right and left labia minora.  No recent pap.   She did see Dr. Matilde Sprang today for her urethral lesion.  She may be needing a urethral surgery with removal of the area.  See other progress note from today for additional history.   Review of Systems  ROS negative expect as outlined.    Objective:   Physical Exam  Genitourinary:        Pap and colposcopy of the vagina and vulva. Consent for procedure.  Speculum placed in vagina.  Cervix absent.  Pap of the vaginal cuff taken and HR HPV taken and sent to [facility]. Atrophy changes noted with speculum placement.  3% acetic acid placed.  No acetowhite lesions noted.  Green filter also used. Lugol's placed in the vagina.  Decreased uptake of the right and left vaginal apices over a large area.  Biopsy taken of the left and the right vaginal apices. Minimal EBL.   Monsel's placed.  Speculum removed.   Acetic acid soaked gauze to the vulva. White and green light filter used. Verrucous lesion noted of the urethra.  Red flat 2 mm lesion of the left labia majora noted and light red 3 mm raised lesion of the right labia majora noted.  Each area injected with 1% lidocaine - lot [id number], expiration 06/15/16. 3 mm punch biopsies used to remove each of the vulva lesions. AgNO3 used on biopsy sites. No complications.  Minimal EBL.     Assessment:     Hx VAIN II.  (Really VIN II.) Potential vaginal lesions.  Vulvar lesions.  Urethral lesion. Cystocele. Rectocele.  Fecal incontinence.    Plan:     Discussion of findings.  Follow up biopsies and pap.  Follow through with urology and colon and rectal surgery.  Final cystocele and rectocele plan to follow after consultations  complete with the health care team.

## 2015-10-18 ENCOUNTER — Encounter: Payer: Self-pay | Admitting: Obstetrics and Gynecology

## 2015-10-20 ENCOUNTER — Other Ambulatory Visit: Payer: Self-pay | Admitting: Urology

## 2015-10-20 ENCOUNTER — Telehealth: Payer: Self-pay | Admitting: Emergency Medicine

## 2015-10-20 NOTE — Telephone Encounter (Signed)
-----   Message from Nunzio Cobbs, MD sent at 10/18/2015  4:14 PM EDT ----- This is the pap part of the patient's colpo results that show the ASCUS and positive HR HPV.  (You already received a result note about this.  I have recommended recall - 08.   Cc- Marisa Sprinkles

## 2015-10-20 NOTE — Telephone Encounter (Signed)
-----   Message from Nunzio Cobbs, MD sent at 10/18/2015  4:12 PM EDT ----- Please report pap and colpo biopsies to patient.  (came through as separate reports from [facility] Pap shows ASCUS of the vaginal cuff and positive HR HPV.  Colpo biopsies of the vaginal cuff and vulva are all negative for atypia and dysplasia.   She has a hx of VIN II from an outside office (not VAIN II).  She is currently seeing urology for potential urethral dysplasia.  I would like for her to be in recall - 08.   Cc- Marisa Sprinkles

## 2015-10-20 NOTE — Telephone Encounter (Signed)
Spoke with patient. She is given results of pap smear and colposcopy. Verbalized understanding of results.   08 recall is entered.   She is advised to call back after she has had her outpatient biopsy with urology and colorectal surgery. She is agreeable.   Routing to provider for final review. Patient agreeable to disposition. Will close encounter.

## 2015-10-25 ENCOUNTER — Encounter (HOSPITAL_BASED_OUTPATIENT_CLINIC_OR_DEPARTMENT_OTHER): Payer: Self-pay | Admitting: *Deleted

## 2015-10-25 DIAGNOSIS — N816 Rectocele: Secondary | ICD-10-CM | POA: Diagnosis not present

## 2015-10-25 NOTE — Progress Notes (Signed)
NPO AFTER MN WITH EXCEPTION CLEAR LIQUIDS UNTIL 0700 (NO CREAM / MILK PRODUCTS).  ARRIVE AT 1100. NEEDS ISTAT AND EKG.

## 2015-11-03 ENCOUNTER — Ambulatory Visit (HOSPITAL_BASED_OUTPATIENT_CLINIC_OR_DEPARTMENT_OTHER)
Admission: RE | Admit: 2015-11-03 | Discharge: 2015-11-03 | Disposition: A | Discharge status: Home / Self Care | Payer: BLUE CROSS/BLUE SHIELD | Source: Ambulatory Visit | Attending: Urology | Admitting: Urology

## 2015-11-03 ENCOUNTER — Ambulatory Visit (HOSPITAL_BASED_OUTPATIENT_CLINIC_OR_DEPARTMENT_OTHER): Payer: BLUE CROSS/BLUE SHIELD | Admitting: Anesthesiology

## 2015-11-03 ENCOUNTER — Encounter (HOSPITAL_BASED_OUTPATIENT_CLINIC_OR_DEPARTMENT_OTHER)
Admission: RE | Disposition: A | Discharge status: Home / Self Care | Payer: Self-pay | Source: Ambulatory Visit | Attending: Urology

## 2015-11-03 ENCOUNTER — Encounter (HOSPITAL_BASED_OUTPATIENT_CLINIC_OR_DEPARTMENT_OTHER): Payer: Self-pay | Admitting: *Deleted

## 2015-11-03 DIAGNOSIS — A630 Anogenital (venereal) warts: Secondary | ICD-10-CM | POA: Diagnosis not present

## 2015-11-03 DIAGNOSIS — N3031 Trigonitis with hematuria: Secondary | ICD-10-CM | POA: Insufficient documentation

## 2015-11-03 DIAGNOSIS — N368 Other specified disorders of urethra: Secondary | ICD-10-CM | POA: Diagnosis not present

## 2015-11-03 DIAGNOSIS — D4959 Neoplasm of unspecified behavior of other genitourinary organ: Secondary | ICD-10-CM | POA: Diagnosis not present

## 2015-11-03 DIAGNOSIS — N3020 Other chronic cystitis without hematuria: Secondary | ICD-10-CM | POA: Diagnosis not present

## 2015-11-03 DIAGNOSIS — I10 Essential (primary) hypertension: Secondary | ICD-10-CM | POA: Insufficient documentation

## 2015-11-03 DIAGNOSIS — D494 Neoplasm of unspecified behavior of bladder: Secondary | ICD-10-CM | POA: Diagnosis not present

## 2015-11-03 HISTORY — DX: Rectocele: N81.6

## 2015-11-03 HISTORY — PX: CYSTOSCOPY WITH BIOPSY: SHX5122

## 2015-11-03 HISTORY — DX: Personal history of vulvar dysplasia: Z87.412

## 2015-11-03 HISTORY — PX: MASS EXCISION: SHX2000

## 2015-11-03 HISTORY — DX: Other symptoms and signs involving the genitourinary system: R39.89

## 2015-11-03 HISTORY — DX: Presence of spectacles and contact lenses: Z97.3

## 2015-11-03 LAB — POCT I-STAT, CHEM 8
BUN: 29 mg/dL — ABNORMAL HIGH (ref 6–20)
Calcium, Ion: 1.29 mmol/L — ABNORMAL HIGH (ref 1.12–1.23)
Chloride: 104 mmol/L (ref 101–111)
Creatinine, Ser: 0.7 mg/dL (ref 0.44–1.00)
Glucose, Bld: 89 mg/dL (ref 65–99)
HCT: 42 % (ref 36.0–46.0)
Hemoglobin: 14.3 g/dL (ref 12.0–15.0)
Potassium: 4 mmol/L (ref 3.5–5.1)
Sodium: 141 mmol/L (ref 135–145)
TCO2: 29 mmol/L (ref 0–100)

## 2015-11-03 SURGERY — CYSTOSCOPY, WITH BIOPSY
Anesthesia: General | Site: Urethra

## 2015-11-03 MED ORDER — ONDANSETRON HCL 4 MG/2ML IJ SOLN
INTRAMUSCULAR | Status: DC | PRN
  Administered 2015-11-03: 4 mg via INTRAVENOUS

## 2015-11-03 MED ORDER — DEXAMETHASONE SODIUM PHOSPHATE 4 MG/ML IJ SOLN
INTRAMUSCULAR | Status: DC | PRN
  Administered 2015-11-03: 10 mg via INTRAVENOUS

## 2015-11-03 MED ORDER — LIDOCAINE 2% (20 MG/ML) 5 ML SYRINGE
INTRAMUSCULAR | Status: DC | PRN
  Administered 2015-11-03: 60 mg via INTRAVENOUS

## 2015-11-03 MED ORDER — STERILE WATER FOR IRRIGATION IR SOLN
Status: DC | PRN
  Administered 2015-11-03: 3000 mL

## 2015-11-03 MED ORDER — BUPIVACAINE-EPINEPHRINE (PF) 0.5% -1:200000 IJ SOLN
INTRAMUSCULAR | Status: DC | PRN
  Administered 2015-11-03: 3 mL via PERINEURAL

## 2015-11-03 MED ORDER — GENTAMICIN IN SALINE 1.6-0.9 MG/ML-% IV SOLN
80.0000 mg | INTRAVENOUS | Status: DC
  Filled 2015-11-03: qty 50

## 2015-11-03 MED ORDER — GENTAMICIN SULFATE 40 MG/ML IJ SOLN
5.0000 mg/kg | INTRAVENOUS | Status: AC
  Administered 2015-11-03: 330 mg via INTRAVENOUS
  Filled 2015-11-03: qty 8.25

## 2015-11-03 MED ORDER — PROPOFOL 10 MG/ML IV BOLUS
INTRAVENOUS | Status: AC
  Filled 2015-11-03: qty 20

## 2015-11-03 MED ORDER — HYDROMORPHONE HCL 1 MG/ML IJ SOLN
0.2500 mg | INTRAMUSCULAR | Status: DC | PRN
  Filled 2015-11-03: qty 1

## 2015-11-03 MED ORDER — SENNOSIDES-DOCUSATE SODIUM 8.6-50 MG PO TABS: 1.0000 | ORAL_TABLET | Freq: Two times a day (BID) | ORAL | Status: DC

## 2015-11-03 MED ORDER — EPHEDRINE SULFATE 50 MG/ML IJ SOLN
INTRAMUSCULAR | Status: DC | PRN
  Administered 2015-11-03 (×2): 5 mg via INTRAVENOUS
  Administered 2015-11-03: 10 mg via INTRAVENOUS

## 2015-11-03 MED ORDER — MEPERIDINE HCL 25 MG/ML IJ SOLN
6.2500 mg | INTRAMUSCULAR | Status: DC | PRN
  Filled 2015-11-03: qty 1

## 2015-11-03 MED ORDER — OXYCODONE-ACETAMINOPHEN 5-325 MG PO TABS: 1.0000 | ORAL_TABLET | Freq: Four times a day (QID) | ORAL | Status: DC | PRN

## 2015-11-03 MED ORDER — FENTANYL CITRATE (PF) 100 MCG/2ML IJ SOLN
INTRAMUSCULAR | Status: DC | PRN
  Administered 2015-11-03: 50 ug via INTRAVENOUS

## 2015-11-03 MED ORDER — DEXAMETHASONE SODIUM PHOSPHATE 10 MG/ML IJ SOLN
INTRAMUSCULAR | Status: AC
  Filled 2015-11-03: qty 1

## 2015-11-03 MED ORDER — MIDAZOLAM HCL 2 MG/2ML IJ SOLN
INTRAMUSCULAR | Status: AC
  Filled 2015-11-03: qty 2

## 2015-11-03 MED ORDER — EPHEDRINE SULFATE 50 MG/ML IJ SOLN
INTRAMUSCULAR | Status: AC
  Filled 2015-11-03: qty 1

## 2015-11-03 MED ORDER — SULFAMETHOXAZOLE-TRIMETHOPRIM 800-160 MG PO TABS: 1.0000 | ORAL_TABLET | Freq: Two times a day (BID) | ORAL | Status: DC

## 2015-11-03 MED ORDER — ONDANSETRON HCL 4 MG/2ML IJ SOLN
INTRAMUSCULAR | Status: AC
  Filled 2015-11-03: qty 2

## 2015-11-03 MED ORDER — PROMETHAZINE HCL 25 MG/ML IJ SOLN
6.2500 mg | INTRAMUSCULAR | Status: DC | PRN
  Filled 2015-11-03: qty 1

## 2015-11-03 MED ORDER — LIDOCAINE HCL (CARDIAC) 20 MG/ML IV SOLN
INTRAVENOUS | Status: AC
  Filled 2015-11-03: qty 5

## 2015-11-03 MED ORDER — LACTATED RINGERS IV SOLN
INTRAVENOUS | Status: DC
  Administered 2015-11-03 (×2): via INTRAVENOUS
  Filled 2015-11-03: qty 1000

## 2015-11-03 MED ORDER — MIDAZOLAM HCL 5 MG/5ML IJ SOLN
INTRAMUSCULAR | Status: DC | PRN
  Administered 2015-11-03: 2 mg via INTRAVENOUS

## 2015-11-03 MED ORDER — IOHEXOL 300 MG/ML  SOLN
INTRAMUSCULAR | Status: DC | PRN
  Administered 2015-11-03: 10 mL

## 2015-11-03 MED ORDER — FENTANYL CITRATE (PF) 100 MCG/2ML IJ SOLN
INTRAMUSCULAR | Status: AC
  Filled 2015-11-03: qty 2

## 2015-11-03 MED ORDER — PROPOFOL 10 MG/ML IV BOLUS
INTRAVENOUS | Status: DC | PRN
  Administered 2015-11-03: 200 mg via INTRAVENOUS

## 2015-11-03 SURGICAL SUPPLY — 38 items
BAG DRAIN URO-CYSTO SKYTR STRL (DRAIN) ×3 IMPLANT
BAG DRN UROCATH (DRAIN) ×2
BAG URINE DRAINAGE (UROLOGICAL SUPPLIES) ×1 IMPLANT
BLADE SURG 15 STRL LF DISP TIS (BLADE) IMPLANT
BLADE SURG 15 STRL SS (BLADE) ×3
CATH FOLEY 2WAY SLVR  5CC 14FR (CATHETERS) ×2
CATH FOLEY 2WAY SLVR 5CC 14FR (CATHETERS) IMPLANT
CATH ROBINSON RED A/P 14FR (CATHETERS) IMPLANT
CLOTH BEACON ORANGE TIMEOUT ST (SAFETY) ×3 IMPLANT
COVER LIGHT HANDLE  1/PK (MISCELLANEOUS) ×1
COVER LIGHT HANDLE 1/PK (MISCELLANEOUS) IMPLANT
ELECT REM PT RETURN 9FT ADLT (ELECTROSURGICAL) ×3
ELECTRODE REM PT RTRN 9FT ADLT (ELECTROSURGICAL) ×2 IMPLANT
GLOVE BIO SURGEON STRL SZ 6.5 (GLOVE) ×1 IMPLANT
GLOVE BIO SURGEON STRL SZ7.5 (GLOVE) ×3 IMPLANT
GLOVE BIOGEL PI IND STRL 6.5 (GLOVE) IMPLANT
GLOVE BIOGEL PI INDICATOR 6.5 (GLOVE) ×2
GOWN STRL REUS W/ TWL LRG LVL3 (GOWN DISPOSABLE) ×6 IMPLANT
GOWN STRL REUS W/TWL LRG LVL3 (GOWN DISPOSABLE) ×6
GUIDEWIRE STR DUAL SENSOR (WIRE) ×1 IMPLANT
HOLDER FOLEY CATH W/STRAP (MISCELLANEOUS) ×1 IMPLANT
KIT ROOM TURNOVER WOR (KITS) ×3 IMPLANT
MANIFOLD NEPTUNE II (INSTRUMENTS) ×1 IMPLANT
NDL HYPO 18GX1.5 BLUNT FILL (NEEDLE) IMPLANT
NDL HYPO 30X.5 LL (NEEDLE) IMPLANT
NEEDLE HYPO 18GX1.5 BLUNT FILL (NEEDLE) IMPLANT
NEEDLE HYPO 30X.5 LL (NEEDLE) ×3 IMPLANT
PACK CYSTO (CUSTOM PROCEDURE TRAY) ×3 IMPLANT
PENCIL BUTTON HOLSTER BLD 10FT (ELECTRODE) ×1 IMPLANT
PORT OPEN END 8FR 1 LUMEN (CATHETERS) ×1 IMPLANT
SUT SILK 2 0 FS (SUTURE) ×1 IMPLANT
SUT VIC AB 4-0 RB1 27 (SUTURE) ×9
SUT VIC AB 4-0 RB1 27X BRD (SUTURE) IMPLANT
SYR 20CC LL (SYRINGE) IMPLANT
SYR CONTROL 10ML LL (SYRINGE) ×1 IMPLANT
SYRINGE 10CC LL (SYRINGE) ×1 IMPLANT
TUBE CONNECTING 12X1/4 (SUCTIONS) IMPLANT
WATER STERILE IRR 3000ML UROMA (IV SOLUTION) ×3 IMPLANT

## 2015-11-03 NOTE — Transfer of Care (Signed)
  Immediate Anesthesia Transfer of Care Note  Patient: Stephanie Armstrong  Procedure(s) Performed: Procedure(s) (LRB): CYSTOSCOPY WITH URETHRAL BIOPSY, FULGERATION, BILATERAL RETROGRADE PYELOGRAMS (Bilateral) EXCISION  URETHRAL MASS (N/A)  Patient Location: PACU  Anesthesia Type: General  Level of Consciousness: awake, oriented, sedated and patient cooperative  Airway & Oxygen Therapy: Patient Spontanous Breathing and Patient connected to face mask oxygen  Post-op Assessment: Report given to PACU RN and Post -op Vital signs reviewed and stable  Post vital signs: Reviewed and stable  Complications: No apparent anesthesia complications Last Vitals:  Filed Vitals:   11/03/15 1106 11/03/15 1338  BP: 109/68 126/80  Pulse: 54 75  Temp: 36.6 C 36.4 C  Resp: 16 14

## 2015-11-03 NOTE — Anesthesia Preprocedure Evaluation (Signed)
Anesthesia Evaluation  Patient identified by MRN, date of birth, ID band Patient awake    Reviewed: Allergy & Precautions, NPO status , Patient's Chart, lab work & pertinent test results  Airway Mallampati: II  TM Distance: >3 FB Neck ROM: Full    Dental no notable dental hx.    Pulmonary neg pulmonary ROS,    Pulmonary exam normal breath sounds clear to auscultation       Cardiovascular hypertension, negative cardio ROS Normal cardiovascular exam Rhythm:Regular Rate:Normal     Neuro/Psych negative neurological ROS  negative psych ROS   GI/Hepatic negative GI ROS, Neg liver ROS,   Endo/Other  negative endocrine ROS  Renal/GU negative Renal ROS     Musculoskeletal negative musculoskeletal ROS (+)   Abdominal   Peds  Hematology negative hematology ROS (+)   Anesthesia Other Findings   Reproductive/Obstetrics negative OB ROS                             Anesthesia Physical Anesthesia Plan  ASA: II  Anesthesia Plan: General   Post-op Pain Management:    Induction: Intravenous  Airway Management Planned: LMA  Additional Equipment:   Intra-op Plan:   Post-operative Plan: Extubation in OR  Informed Consent: I have reviewed the patients History and Physical, chart, labs and discussed the procedure including the risks, benefits and alternatives for the proposed anesthesia with the patient or authorized representative who has indicated his/her understanding and acceptance.   Dental advisory given  Plan Discussed with: CRNA  Anesthesia Plan Comments:         Anesthesia Quick Evaluation

## 2015-11-03 NOTE — Anesthesia Procedure Notes (Signed)
Procedure Name: LMA Insertion Date/Time: 11/03/2015 12:20 PM Performed by: Denna Haggard D Pre-anesthesia Checklist: Patient identified, Emergency Drugs available, Suction available and Patient being monitored Patient Re-evaluated:Patient Re-evaluated prior to inductionOxygen Delivery Method: Circle system utilized Preoxygenation: Pre-oxygenation with 100% oxygen Intubation Type: IV induction Ventilation: Mask ventilation without difficulty LMA: LMA inserted LMA Size: 4.0 Number of attempts: 1 Airway Equipment and Method: Bite block Placement Confirmation: positive ETCO2 Tube secured with: Tape Dental Injury: Teeth and Oropharynx as per pre-operative assessment

## 2015-11-03 NOTE — Discharge Instructions (Signed)
1 - You may have urinary urgency (bladder spasms) and bloody urine on / off x few days. This is normal.  2 - Call MD or go to ER for fever >102, severe pain / nausea / vomiting not relieved by medications, or acute change in medical status   Post Anesthesia Home Care Instructions  Activity: Get plenty of rest for the remainder of the day. A responsible adult should stay with you for 24 hours following the procedure.  For the next 24 hours, DO NOT: -Drive a car -Paediatric nurse -Drink alcoholic beverages -Take any medication unless instructed by your physician -Make any legal decisions or sign important papers.  Meals: Start with liquid foods such as gelatin or soup. Progress to regular foods as tolerated. Avoid greasy, spicy, heavy foods. If nausea and/or vomiting occur, drink only clear liquids until the nausea and/or vomiting subsides. Call your physician if vomiting continues.  Special Instructions/Symptoms: Your throat may feel dry or sore from the anesthesia or the breathing tube placed in your throat during surgery. If this causes discomfort, gargle with warm salt water. The discomfort should disappear within 24 hours.  If you had a scopolamine patch placed behind your ear for the management of post- operative nausea and/or vomiting:  1. The medication in the patch is effective for 72 hours, after which it should be removed.  Wrap patch in a tissue and discard in the trash. Wash hands thoroughly with soap and water. 2. You may remove the patch earlier than 72 hours if you experience unpleasant side effects which may include dry mouth, dizziness or visual disturbances. 3. Avoid touching the patch. Wash your hands with soap and water after contact with the patch.   CYSTOSCOPY HOME CARE INSTRUCTIONS  Activity: Rest for the remainder of the day.  Do not drive or operate equipment today.  You may resume normal activities in one to two days as instructed by your physician.    Meals: Drink plenty of liquids and eat light foods such as gelatin or soup this evening.  You may return to a normal meal plan tomorrow.  Return to Work: You may return to work in one to two days or as instructed by your physician.  Special Instructions / Symptoms: Call your physician if any of these symptoms occur:   -persistent or heavy bleeding  -bleeding which continues after first few urination  -large blood clots that are difficult to pass  -urine stream diminishes or stops completely  -fever equal to or higher than 101 degrees Farenheit.  -cloudy urine with a strong, foul odor  -severe pain  Females should always wipe from front to back after elimination.  You may feel some burning pain when you urinate.  This should disappear with time.  Applying moist heat to the lower abdomen or a hot tub bath may help relieve the pain. \  Follow-Up / Date of Return Visit to Your Physician:   Call for an appointment to arrange follow-up.  Patient Signature:  ________________________________________________________  Nurse's Signature:  ________________________________________________________  Foley Catheter Care, Adult A Foley catheter is a soft, flexible tube. This tube is placed into your bladder to drain pee (urine). If you go home with this catheter in place, follow the instructions below. TAKING CARE OF THE CATHETER 1. Wash your hands with soap and water. 2. Put soap and water on a clean washcloth.  Clean the skin where the tube goes into your body.  Clean away from the tube site.  Never  wipe toward the tube.  Clean the area using a circular motion.  Remove all the soap. Pat the area dry with a clean towel. For males, reposition the skin that covers the end of the penis (foreskin). 3. Attach the tube to your leg with tape or a leg strap. Do not stretch the tube tight. If you are using tape, remove any stickiness left behind by past tape you used. 4. Keep the drainage bag  below your hips. Keep it off the floor. 5. Check your tube during the day. Make sure it is working and draining. Make sure the tube does not curl, twist, or bend. 6. Do not pull on the tube or try to take it out. TAKING CARE OF THE DRAINAGE BAGS You will have a large overnight drainage bag and a small leg bag. You may wear the overnight bag any time. Never wear the small bag at night. Follow the directions below. Emptying the Drainage Bag Empty your drainage bag when it is  - full or at least 2-3 times a day. 1. Wash your hands with soap and water. 2. Keep the drainage bag below your hips. 3. Hold the dirty bag over the toilet or clean container. 4. Open the pour spout at the bottom of the bag. Empty the pee into the toilet or container. Do not let the pour spout touch anything. 5. Clean the pour spout with a gauze pad or cotton ball that has rubbing alcohol on it. 6. Close the pour spout. 7. Attach the bag to your leg with tape or a leg strap. 8. Wash your hands well. Changing the Drainage Bag Change your bag once a month or sooner if it starts to smell or look dirty.  1. Wash your hands with soap and water. 2. Pinch the rubber tube so that pee does not spill out. 3. Disconnect the catheter tube from the drainage tube at the connection valve. Do not let the tubes touch anything. 4. Clean the end of the catheter tube with an alcohol wipe. Clean the end of a the drainage tube with a different alcohol wipe. 5. Connect the catheter tube to the drainage tube of the clean drainage bag. 6. Attach the new bag to the leg with tape or a leg strap. Avoid attaching the new bag too tightly. 7. Wash your hands well. Cleaning the Drainage Bag 1. Wash your hands with soap and water. 2. Wash the bag in warm, soapy water. 3. Rinse the bag with warm water. 4. Fill the bag with a mixture of white vinegar and water (1 cup vinegar to 1 quart warm water [.2 liter vinegar to 1 liter warm water]). Close the bag  and soak it for 30 minutes in the solution. 5. Rinse the bag with warm water. 6. Hang the bag to dry with the pour spout open and hanging downward. 7. Store the clean bag (once it is dry) in a clean plastic bag. 8. Wash your hands well. PREVENT INFECTION  Wash your hands before and after touching your tube.  Take showers every day. Wash the skin where the tube enters your body. Do not take baths. Replace wet leg straps with dry ones, if this applies.  Do not use powders, sprays, or lotions on the genital area. Only use creams, lotions, or ointments as told by your doctor.  For females, wipe from front to back after going to the bathroom.  Drink enough fluids to keep your pee clear or pale yellow unless  you are told not to have too much fluid (fluid restriction).  Do not let the drainage bag or tubing touch or lie on the floor.  Wear cotton underwear to keep the area dry. GET HELP IF:  Your pee is cloudy or smells unusually bad.  Your tube becomes clogged.  You are not draining pee into the bag or your bladder feels full.  Your tube starts to leak. GET HELP RIGHT AWAY IF:  You have pain, puffiness (swelling), redness, or yellowish-white fluid (pus) where the tube enters the body.  You have pain in the belly (abdomen), legs, lower back, or bladder.  You have a fever.  You see blood fill the tube, or your pee is pink or red.  You feel sick to your stomach (nauseous), throw up (vomit), or have chills.  Your tube gets pulled out. MAKE SURE YOU:   Understand these instructions.  Will watch your condition.  Will get help right away if you are not doing well or get worse.   This information is not intended to replace advice given to you by your health care provider. Make sure you discuss any questions you have with your health care provider.   Document Released: 07/29/2012 Document Revised: 04/24/2014 Document Reviewed: 07/29/2012 Elsevier Interactive Patient Education  [employer].

## 2015-11-03 NOTE — Brief Op Note (Signed)
11/03/2015  1:26 PM  PATIENT:  Stephanie Armstrong  60 y.o. female  PRE-OPERATIVE DIAGNOSIS:  URETHRAL MASS,  Hematuria  POST-OPERATIVE DIAGNOSIS:  URETHRAL MASS  PROCEDURE:  Procedure(s): CYSTOSCOPY WITH URETHRAL BIOPSY, FULGERATION, BILATERAL RETROGRADE PYELOGRAMS (Bilateral) EXCISION  URETHRAL MASS (N/A)  SURGEON:  Surgeon(s) and Role:    * Alexis Frock, MD - Primary  PHYSICIAN ASSISTANT:   ASSISTANTS: none   ANESTHESIA:   local and general  EBL:  Total I/O In: 1000 [I.V.:1000] Out: 5 [Blood:5]  BLOOD ADMINISTERED:none  DRAINS: Foley to gravity    LOCAL MEDICATIONS USED:  MARCAINE     SPECIMEN:  Source of Specimen:  1 - bladder biopsies, 2 - urethral mass + fragments  DISPOSITION OF SPECIMEN:  PATHOLOGY  COUNTS:  YES  TOURNIQUET:  * No tourniquets in log *  DICTATION: .Other Dictation: Dictation Number [id number]  PLAN OF CARE: Discharge to home after PACU  PATIENT DISPOSITION:  PACU - hemodynamically stable.   Delay start of Pharmacological VTE agent (>24hrs) due to surgical blood loss or risk of bleeding: yes

## 2015-11-03 NOTE — Anesthesia Postprocedure Evaluation (Signed)
Anesthesia Post Note  Patient: Stephanie Armstrong  Procedure(s) Performed: Procedure(s) (LRB): CYSTOSCOPY WITH URETHRAL BIOPSY, FULGERATION, BILATERAL RETROGRADE PYELOGRAMS (Bilateral) EXCISION  URETHRAL MASS (N/A)  Patient location during evaluation: PACU Anesthesia Type: General Level of consciousness: sedated and patient cooperative Pain management: pain level controlled Vital Signs Assessment: post-procedure vital signs reviewed and stable Respiratory status: spontaneous breathing Cardiovascular status: stable Anesthetic complications: no    Last Vitals:  Filed Vitals:   11/03/15 1400 11/03/15 1415  BP: 107/91 126/77  Pulse: 72 63  Temp:    Resp: 17 14    Last Pain: There were no vitals filed for this visit.               Nolon Nations

## 2015-11-03 NOTE — H&P (Signed)
Stephanie Armstrong is an 60 y.o. female.    Chief Complaint: Pre-op Cysto / Biopsy  HPI:   1 - Urethral Lesions / Rule out Malignancy - small proximal urethral / distal bladder neck lesion by office cysto 2017 on evaluation for urethral spotting.   Today "Stephanie Armstrong" is seen to proceed with operative cysto / biopsy to rule out neoplasm. NO interval fevers. Most recent UA withtou infectious parameters.   Past Medical History  Diagnosis Date  . Hypertension   . Anxiety   . IBS (irritable bowel syndrome)   . Urinary incontinence   . VAIN II (vaginal intraepithelial neoplasia grade II) dx 08/2015    right and left labia minora.   . 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 History of vulvar dysplasia     VIN 2  . Urethral pain     mass  . Rectocele   . Wears contact lenses     Past Surgical History  Procedure Laterality Date  . Augmentation mammaplasty  APRIL 2013    BREAST IMPLANT CHANGED  . Tubal ligation    . Vaginal hysterectomy  1999    Family History  Problem Relation Age of Onset  . Hypertension Mother   . Cancer Father     MELANOMA  . Diabetes Father   . Hypertension Father   . Heart disease Father   . Cancer Sister     THYROID  . Breast cancer Paternal Grandmother   . Colon cancer Neg Hx    Social History:  reports that she has never smoked. She has never used smokeless tobacco. She reports that she drinks about 6.6 - 7.8 oz of alcohol per week. She reports that she does not use illicit drugs.  Allergies: No Known Allergies  No prescriptions prior to admission    No results found for this or any previous visit (from the past 48 hour(s)). No results found.  Review of Systems  Constitutional: Negative.   HENT: Negative.   Eyes: Negative.   Respiratory: Negative.   Cardiovascular: Negative.   Gastrointestinal: Negative.   Genitourinary:       Occasional urethral spotting.   Musculoskeletal: Negative.   Skin: Negative.    Neurological: Negative.   Endo/Heme/Allergies: Negative.   Psychiatric/Behavioral: Negative.     Height 5\' 6"  (1.676 m), weight 65.772 kg (145 lb). Physical Exam  Constitutional: She appears well-developed.  HENT:  Head: Normocephalic.  Eyes: Pupils are equal, round, and reactive to light.  Neck: Normal range of motion.  Cardiovascular: Normal rate.   Respiratory: Effort normal.  GI: Soft.  Genitourinary:  NO CVAT  Neurological: She is alert.  Skin: Skin is warm.  Psychiatric: She has a normal mood and affect. Her behavior is normal. Judgment and thought content normal.     Assessment/Plan  1 - Urethral Lesions / Rule out Malignancy - proceed as planned with cysto / biopsy under anesthesia. Risks, benefits, alternatives, expected peri-op course discussed as well as need for possible temporary catheter penidng internal anatomy.  Alexis Frock, MD 11/03/2015, 6:52 AM

## 2015-11-04 NOTE — Op Note (Signed)
NAMEMISHEL, [name] NO.:  [id number]  MEDICAL RECORD NO.:  [id number]  LOCATION:                                 FACILITY:  PHYSICIAN:  Alexis Frock, MD     DATE OF BIRTH:  02/28/1956  DATE OF PROCEDURE: 11/03/2015                              OPERATIVE REPORT   PREOPERATIVE DIAGNOSIS:  Hematuria and urethral mass.  PREOPERATIVE DIAGNOSES:  Hematuria and urethral mass, bladder erythema.  PROCEDURES: 1. Cystoscopy with bilateral retrograde pyelograms and interpretation. 2. Bladder biopsy with fulguration. 3. Excision of urethral mass, total volume approximately 2 cm2.  ESTIMATED BLOOD LOSS:  Nil.  COMPLICATION:  None.  SPECIMENS: 1. Trigone, bladder biopsy. 2. Posterior wall bladder biopsy. 3. Bladder dome biopsy. 4. Urethral mass ink on deep margin. 5. Additional fragments of urethral mass all for permanent pathology.  DRAINS:  Foley catheter to straight drain.  FINDINGS: 1. Polypoid/papillary mass exiting the urethra, this was involving the     urethra from the 3 o'clock to the 9 o'clock position superiorly. 2. Diffuse bladder erythema with questionable small papillary changes. 3. Unremarkable bilateral retrograde pyelograms.  INDICATIONS:  Stephanie Armstrong is a very pleasant 60 year old lady, who was found on workup of occasional blood with wiping urethra and microhematuria to have concerning a urethral mass.  She was referred for consideration of further diagnostics and possible biopsy and it was felt that exam under anesthesia with cystoscopy retrogrades and biopsy versus excision of the mass was warranted.  Informed consent was obtained and placed in the medical record.  PROCEDURE IN DETAIL:  The patient being Stephanie Armstrong, was verified. Procedure being cysto, bilateral retrogrades and excision of urethral mass was confirmed.  Procedure was carried out.  Time-out was performed. Intravenous antibiotics were administered.  General LMA  anesthesia was introduced.  The patient was placed into a medium lithotomy position and sterile field was created by prepping and draping the patient's vagina, introitus, and proximal thighs using iodine x3.  Exam under anesthesia immediately revealed a pink polypoid/papillary mass emanating from the urethral orifice, which was mostly superiorly from the 3 o'clock to 9 o'clock positions.  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  The mass did not appear to be acutely obstructing and the vagina was also inspected and there were some obvious sites of prior vaginal biopsy, but no obvious vaginal or cervical masses noted. Next, cystourethroscopy was performed using a 21-French cystoscope with offset lens.  Inspection of the urinary bladder revealed some diffuse patchy erythema and questionable small papillary changes, this was highly concerning for possible carcinoma in situ of the bladder versus inflammatory process.  It was clearly felt that bladder biopsy as well as retrograde pyelography would be warranted.  Attention was then directed to retrograde pyelography.  The right ureteral orifice was cannulated with a 6-French end-hole catheter and right retrograde pyelogram was obtained.  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  No filling defects or narrowing noted. Similarly, left retrograde pyelogram was obtained.  Left retrograde pyelogram demonstrated a single left ureter with single- system left kidney.  No filling defects or narrowing noted.  Next, 7- [place] flexible biopsy  forceps were used under sterile water irrigation to obtain representative small seromuscular bladder biopsies from the trigone posterior and dome areas of the bladder respectively.  These were set aside as a separate permanent pathology specimens.  The base of each one of the biopsy sites was fulgurated with Bugbee  electrode sequentially, which resulted in excellent hemostasis.  Cystoscopy following these maneuvers revealed excellent hemostasis and no evidence of perforation whatsoever and continued patency of bilateral ureteral orifices.  Attention was then directed at further cauterization of urethral mass.  An 18-French Foley catheter was placed per urethra to straight drain.  A single labial retraction stitch was applied to each side and the mass was very carefully inspected circumferentially. Again, the mass appeared to be quite concerning for possible urethral neoplasm given its appearance and again appeared to be involving the 3 o'clock to 9 o'clock positions superiorly.  The mucosal edges could be seen both proximally and distally throughout its course and it was felt that an attempt at excisional biopsy would be warranted for both further cauterization and therapeutic benefit.  As such, the urethra was circumferentially infiltrated with 5 mL of 0.5% Marcaine with epinephrine and very careful excision was performed in an elliptical fashion of all polypoid tissue beginning with the 12 o'clock more proximal segment and then concluding with the more distal segment keeping what appeared to be a very small rim of normal mucosal tissue with all sides of the specimen.  After manipulation of this specimen, a few small fragments of the tissue became dislodged and it was set aside, separately labeled.  The elliptical urethral incision was then carefully set aside on the back table and the deep margin and edge margins completely inked, sed aside for permanent pathology.  Further inspection revealed complete resolution of all gross papillary tissue.  Given the urethral mucosal defect, attention was then directed at reconstruction, very careful interrupted urethral reconstruction was performed using interrupted 4-0 Vicryl suture of the aforementioned site, reapproximating both the proximal and distal  urethra mucosal edges from the 3 o'clock to 9 o'clock position.  This resulted in excellent mucosal apposition.  Given the use of sharp needle in the vicinity of Foley catheter, the new Foley catheter was placed per urethra to straight drain and also an 18-French was irrigated quantitatively.  The labial retraction stitches were removed.  Hemostasis appeared excellent.  Sponge and needle counts were correct and procedure was terminated.  The patient tolerated the procedure well.  There were no immediate periprocedural complications.  The patient was taken to the Postanesthesia Care Unit in stable condition.    ______________________________ Alexis Frock, MD   ______________________________ Alexis Frock, MD    TM/MEDQ  D:  11/03/2015  T:  11/03/2015  Job:  [id number]

## 2015-11-05 ENCOUNTER — Encounter (HOSPITAL_BASED_OUTPATIENT_CLINIC_OR_DEPARTMENT_OTHER): Payer: Self-pay | Admitting: Urology

## 2015-11-08 DIAGNOSIS — R350 Frequency of micturition: Secondary | ICD-10-CM | POA: Diagnosis not present

## 2015-11-25 DIAGNOSIS — R350 Frequency of micturition: Secondary | ICD-10-CM | POA: Diagnosis not present

## 2015-11-25 DIAGNOSIS — A630 Anogenital (venereal) warts: Secondary | ICD-10-CM | POA: Diagnosis not present

## 2016-01-03 ENCOUNTER — Other Ambulatory Visit: Payer: Self-pay | Admitting: Family Medicine

## 2016-01-10 ENCOUNTER — Ambulatory Visit (INDEPENDENT_AMBULATORY_CARE_PROVIDER_SITE_OTHER): Payer: BLUE CROSS/BLUE SHIELD | Admitting: Family Medicine

## 2016-01-10 ENCOUNTER — Encounter: Payer: Self-pay | Admitting: Family Medicine

## 2016-01-10 VITALS — BP 128/88 | Ht 66.0 in | Wt 145.0 lb

## 2016-01-10 DIAGNOSIS — K588 Other irritable bowel syndrome: Secondary | ICD-10-CM

## 2016-01-10 DIAGNOSIS — G4700 Insomnia, unspecified: Secondary | ICD-10-CM | POA: Diagnosis not present

## 2016-01-10 DIAGNOSIS — I10 Essential (primary) hypertension: Secondary | ICD-10-CM | POA: Diagnosis not present

## 2016-01-10 DIAGNOSIS — Z23 Encounter for immunization: Secondary | ICD-10-CM | POA: Diagnosis not present

## 2016-01-10 DIAGNOSIS — R21 Rash and other nonspecific skin eruption: Secondary | ICD-10-CM | POA: Diagnosis not present

## 2016-01-10 MED ORDER — AMLODIPINE BESY-BENAZEPRIL HCL 5-40 MG PO CAPS: ORAL_CAPSULE | ORAL | 5 refills | Status: DC

## 2016-01-10 MED ORDER — ALPRAZOLAM 1 MG PO TABS: ORAL_TABLET | ORAL | 5 refills | Status: DC

## 2016-01-10 MED ORDER — HYDROCHLOROTHIAZIDE 25 MG PO TABS: ORAL_TABLET | ORAL | 5 refills | Status: DC

## 2016-01-10 NOTE — Progress Notes (Signed)
   Subjective:    Patient ID: Stephanie Armstrong, female    DOB: Sep 15, 1955, 60 y.o.   MRN: [id number]  Hypertension  This is a chronic problem. Treatments tried: lotrel, hctz. Compliance problems: eats healthy, exercises 5 days a week, takes meds every day.    Concerns about skin. Skin has a texture to it. Started in May. Feels like skin is burning when she is outside. Pt has chill bump type texture  Notes more on thighs and arms but can occur in non skin areas  Walks out I the sun and feels skin is on fire   No hx of hives with the sun   Got some in antecub space of both arms  No hx of fall allergies   Patient notes ongoing challenges with anxiety. States deftly needs medication for sleep. Also uses a half blistering a day for anxiety.  Constipation. Takes laxative twice a week. Has seen GI specialist for this issue.  Pt had surg on urethra   Wants flu vaccine today.   Wants to get Hep C screening.   Blood pressure medicine and blood pressure levels reviewed today with patient. Compliant with blood pressure medicine. States does not miss a dose. No obvious side effects. Blood pressure generally good when checked elsewhere. Watching salt intake.  exer fregullay , twice per wk , four d poer wk  occas during day for anxiety  One hal prn     Review of Systems No headache, no major weight loss or weight gain, no chest pain no back pain abdominal pain no change in bowel habits complete ROS otherwise negative     Objective:   Physical Exam  Alert vitals stable, NAD. Blood pressure good on repeat. HEENT normal. Lungs clear. Heart regular rate and rhythm. Diffuse faint hypertrophy of low glands and diffuse bump distribution. Texture of skin itself within normal limits. Accentuated in sun exposed areas.      Assessment & Plan:  Impression 1 hypertension good control discussed maintain same meds #2 chronic anxiety with insomnia discussed maintain same meds #3 skin changes.  Makes patient quite anxious. Wonders if this is a sign of serious underlying disease I've advised her not. Encouraged to follow-up with dermatologist. 25 minutes spent most in discussion WSL

## 2016-01-11 DIAGNOSIS — L570 Actinic keratosis: Secondary | ICD-10-CM | POA: Diagnosis not present

## 2016-01-11 DIAGNOSIS — L853 Xerosis cutis: Secondary | ICD-10-CM | POA: Diagnosis not present

## 2016-01-11 DIAGNOSIS — C44619 Basal cell carcinoma of skin of left upper limb, including shoulder: Secondary | ICD-10-CM | POA: Diagnosis not present

## 2016-01-19 DIAGNOSIS — C44619 Basal cell carcinoma of skin of left upper limb, including shoulder: Secondary | ICD-10-CM | POA: Diagnosis not present

## 2016-02-01 ENCOUNTER — Ambulatory Visit: Payer: BLUE CROSS/BLUE SHIELD | Admitting: Family Medicine

## 2016-02-04 ENCOUNTER — Telehealth: Payer: Self-pay | Admitting: Obstetrics and Gynecology

## 2016-02-04 NOTE — Telephone Encounter (Signed)
Patient called requesting to schedule a surgery consultation with Dr. Quincy Simmonds.

## 2016-02-07 NOTE — Telephone Encounter (Signed)
Return call to patient. Patient interested in follow-up with Dr Quincy Simmonds to see about possible options for surgery now that other issues have been addressed. Appointment with Dr Quincy Simmonds scheduled for 02-11-16 at [place] to provider for final review. Patient agreeable to disposition. Will close encounter.

## 2016-02-11 ENCOUNTER — Ambulatory Visit (INDEPENDENT_AMBULATORY_CARE_PROVIDER_SITE_OTHER): Payer: BLUE CROSS/BLUE SHIELD | Admitting: Obstetrics and Gynecology

## 2016-02-11 VITALS — BP 136/80 | HR 78 | Resp 16 | Ht 66.0 in | Wt 149.0 lb

## 2016-02-11 DIAGNOSIS — N8110 Cystocele, unspecified: Secondary | ICD-10-CM

## 2016-02-11 DIAGNOSIS — N816 Rectocele: Secondary | ICD-10-CM | POA: Diagnosis not present

## 2016-02-11 DIAGNOSIS — N993 Prolapse of vaginal vault after hysterectomy: Secondary | ICD-10-CM | POA: Diagnosis not present

## 2016-02-11 NOTE — Progress Notes (Signed)
GYNECOLOGY  VISIT   HPI: 60 y.o.   Widowed  Caucasian  female   G3P3 with No LMP recorded. Patient has had a hysterectomy.   here for  Follow up.  Has anxiety about her pelvic organ prolapse. 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  Using Citrucel to help with constipation issues.  Doing an enema once every 2 -3 weeks.  Also had fecal incontinence. Did see Dr. Leighton Ruff, colon and rectal surgeon, who did not recommend doing surgery at this time for fecal incontinence.   No leak with cough, laugh or sneeze.  No problems voiding.   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 Tresa Moore at [facility].  Previous dx of VAIN II.  Not sexually active.  No partner.  GYNECOLOGIC HISTORY: No LMP recorded. Patient has had a hysterectomy. Contraception:   Hysterectomy  Menopausal hormone therapy:  None Last mammogram:  10/11/15 BIRADs1:neg  Last pap smear:   10/11/15 ASCUS. HR HPV:+detected. Colposcopy of vagina and vulva on 10/11/15.  Pap showed ASCUS and HR HPV positive.  Bx of right and left vaginal apices showed no atypia or dysplasia.  Biopsies of the right and left labia majora also negative for atypia and dysplasia.  (Hx of prior VIN II dx at outside office.)        OB History    Gravida Para Term Preterm AB Living   3 3       3    SAB TAB Ectopic Multiple Live Births                     Patient Active Problem List   Diagnosis Date Noted  . Carpal tunnel syndrome 07/02/2014  . Depression 01/12/2013  . Essential hypertension, benign 01/12/2013  . 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 Easy bruisability 06/26/2012  . Menopause 06/26/2012  . Insomnia 06/26/2012    Past Medical History:  Diagnosis Date  . Anxiety   . 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 History of vulvar dysplasia    VIN 2  . Hypertension   . IBS (irritable bowel syndrome)   . Rectocele   . Urethral  pain    mass  . Urinary incontinence   . 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   . Wears contact lenses     Past Surgical History:  Procedure Laterality Date  . AUGMENTATION MAMMAPLASTY  APRIL 2013   BREAST IMPLANT CHANGED  . CYSTOSCOPY WITH BIOPSY Bilateral 11/03/2015   Procedure: CYSTOSCOPY WITH URETHRAL BIOPSY, FULGERATION, BILATERAL RETROGRADE PYELOGRAMS;  Surgeon: Alexis Frock, MD;  Location: [facility];  Service: Urology;  Laterality: Bilateral;  . MASS EXCISION N/A 11/03/2015   Procedure: EXCISION  URETHRAL MASS;  Surgeon: Alexis Frock, MD;  Location: [facility];  Service: Urology;  Laterality: N/A;  . TUBAL LIGATION    . VAGINAL HYSTERECTOMY  1999    Current Outpatient Prescriptions  Medication Sig Dispense Refill  . ALPRAZolam (XANAX) 1 MG tablet TAKE 1 TO 1 &1/2 TABLETS AT BEDTIME AS NEEDED . MAY FILL MONTHLY. 45 tablet 5  . amLODipine-benazepril (LOTREL) 5-40 MG capsule TAKE (1) CAPSULE BY MOUTH ONCE DAILY. 30 capsule 5  . Biotin 1000 MCG tablet Take 1,000 mcg by mouth daily.    . Calcium Citrate-Vitamin D (CITRACAL + D PO) Take by mouth daily.    Marland Kitchen  hydrochlorothiazide (HYDRODIURIL) 25 MG tablet TAKE ONE TABLET BY MOUTH ONCE DAILY AS NEEDED FOR SWELLING. DO NOT EXCEED 3 PER WEEK. 30 tablet 5  . ibuprofen (ADVIL,MOTRIN) 600 MG tablet Take 600 mg by mouth every 6 (six) hours as needed for pain.    . Magnesium 200 MG TABS Take 1 tablet by mouth daily.    . methylcellulose (CITRUCEL) oral powder Take by mouth daily.    Marland Kitchen senna-docusate (SENOKOT-S) 8.6-50 MG tablet Take 1 tablet by mouth 2 (two) times daily. While taking pain meds to prevent constipation. 30 tablet 0  . tretinoin (RETIN-A) 0.05 % cream APP PEA SIZED AMOUNT AA QHS AS TOLERATED  1   No current facility-administered medications for this visit.      ALLERGIES: Review of patient's allergies indicates no known  allergies.  Family History  Problem Relation Age of Onset  . Hypertension Mother   . Cancer Father     MELANOMA  . Diabetes Father   . Hypertension Father   . Heart disease Father   . Cancer Sister     THYROID  . Breast cancer Paternal Grandmother   . Colon cancer Neg Hx     Social History   Social History  . Marital status: Widowed    Spouse name: N/A  . Number of children: N/A  . Years of education: N/A   Occupational History  . Not on file.   Social History Main Topics  . Smoking status: Never Smoker  . Smokeless tobacco: Never Used  . Alcohol use 6.6 - 7.8 oz/week    7 Glasses of wine, 4 - 6 Standard drinks or equivalent per week  . Drug use: No  . Sexual activity: Not on file     Comment: Tubal/TVH--ovaries remain   Other Topics Concern  . Not on file   Social History Narrative  . No narrative on file    ROS:  Pertinent items are noted in HPI.  PHYSICAL EXAMINATION:    BP 136/80 (BP Location: Right Arm, Patient Position: Sitting, Cuff Size: Normal)   Pulse 78   Resp 16   Ht 5\' 6"  (1.676 m)   Wt 149 lb (67.6 kg)   BMI 24.05 kg/m     General appearance: alert, cooperative and appears stated age  Pelvic: External genitalia:  no lesions              Urethra:  normal appearing urethra with no masses, tenderness or lesions              Bartholins and Skenes: normal                 Vagina: normal appearing vagina with normal color and discharge, no lesions.  Second degree cystocele and second degree rectocele, first degree apical prolapse.  Vagina is a little short and the apex has palpable tunnels on the right and left.               Cervix: absent.                 Bimanual Exam:  Uterus:   Absent.              Adnexa: no mass, fullness, tenderness              Rectal exam: Yes.  .  Confirms.              Anus:  normal sphincter tone, no lesions  Chaperone was present for exam.  ASSESSMENT  Status post LAVH.  Ovaries remain.  Cystocele and  rectocele.  Hx VIN II.  Colposcopy of vagina and vulva June 2017.  No atypia, dysplasia, or malignancy.  Status post excision of urethral condyloma.  PLAN  Discussion of anatomic findings of prolapse of anterior, apical, and posterior vaginal walls and shortened vaginal length. Options for care reviewed including - observation, pessary, PT, and surgical reconstruction. She is interested in surgery and may wish to do this after December 6.  I have recommended an abdominosacrocolpopexy, potential Halban's culdoplasty, and anterior and posterior colporrhaphy with potential midurethral sling and cystoscopy.   She understands that the sacrocolpopexy may be performed laparoscopically and that this would require referral to another surgical provider as I currently do not perform this procedure in this fashion at this time. We will need to create a plan for preservation or removal of the ovaries and tubes. We will need to discuss local vaginal estrogen in preparation for surgery. Will order urodynamics.    An After Visit Summary was printed and given to the patient.  _25_____ minutes face to face time of which over 50% was spent in counseling.

## 2016-02-13 ENCOUNTER — Telehealth: Payer: Self-pay | Admitting: Obstetrics and Gynecology

## 2016-02-13 ENCOUNTER — Encounter: Payer: Self-pay | Admitting: Obstetrics and Gynecology

## 2016-02-13 NOTE — Telephone Encounter (Signed)
Please contact patient regarding potential surgical planning for December.   She is working toward: Abdominosacrocolpopexy, potential Halban's culdoplasty, anterior and posterior colporrhaphy, TVT Exact midurethral sling, and cystoscopy after December 6.  Her diagnosis is cystocele, rectocele, vaginal vault prolapse following hysterectomy.   She will need the following: - Rx for vaginal estrogen cream, Premarin 1/2 gram pv at hs for 2 weeks and then 1/2 gram pv at hs twice weekly.  Side effects can be vaginal discharge and irritation.  She had a normal mammogram, so this is ok from a breast health standpoint.  This is an important part of her surgical planning. - urodynamic scheduled. - post urodynamic consultation for final surgical planning which will also need to address the status of her tubes and ovaries.   I have not sent this to precert yet.

## 2016-02-16 MED ORDER — ESTROGENS, CONJUGATED 0.625 MG/GM VA CREA: TOPICAL_CREAM | VAGINAL | 0 refills | Status: DC

## 2016-02-16 NOTE — Telephone Encounter (Signed)
Call to patient. Surgery date options discussed. Patient requests mid December due to her son's wedding on 03-18-16.  Discussed urodynamic testing and scheduled for 03-01-16. Brief review of procedure provided. Advised of Dr Elza Rafter instructions for Premarin cream in preparation for surgery. Patient agreeable and voiced understanding.  Will proceed with surgery scheduling for 03-28-16 and review surgery instructions when in office for urodynamic testing.   Routing to provider for final review. Patient agreeable to disposition. Will close encounter.

## 2016-02-17 ENCOUNTER — Telehealth: Payer: Self-pay | Admitting: *Deleted

## 2016-02-17 NOTE — Telephone Encounter (Signed)
Call to patient to review Urodynamics testing. Patient verbalized understanding. Advised this letter would be sent to her in the mail.    Urodynamics Bladder Testing  Your physician has recommended a procedure for you to be done in our office called Urodynamics. Your Urodynamics testing appointment is scheduled for Wednesday 03/01/16 at 1100. Please arrive 15 minutes prior to your appointment for check in.   What is Urodynamics? Urodynamics is a test that looks at the behavior of the bladder. There are different types of urinary incontinence and they each require different treatments.  Although this test may be very personal in nature, we are attempting to recreate your urinary leakage during testing so that your physician can ensure you have the right treatment for your diagnosis.  Patient instructions: . You will need to come to our office 2-3 days prior for a urine test to ensure you do not have any urinary infection prior to the procedure.   . Your appointment for urine testing is scheduled for Thursday 02/24/16 at 1530.  Marland Kitchen Please stop any bladder control medications one week prior to the procedure. This includes: Ditropan, Detrol, Enablex, Oxytrol, Sanctura, and Vesicare. You may take all of your other medications as prescribed.  . Please arrive for your appointment with a comfortably full bladder, as you will need to void at the beginning of testing. . You may eat and drink normally on the day of your test.  You may shower normally on the day of your test.  Patient information: . The test will take approximately one hour to complete.  It is not painful and you will be with a nurse and assistant for the procedure. . You will be asked to empty your bladder in private, into a special toilet. . Small catheters (thin flexible tube) will be inserted into your bladder and rectum which will record pressure readings of your bladder into a computer.   . Your bladder will be filled with sterile  water until you feel the need to urinate.  . You will be directed to cough and bare down at various stages during the test.  . At the end of the test you will be allowed to empty your bladder.  After your testing is completed: Marland Kitchen You may have a mild bladder and rectal discomfort for a few hours after the test. . You may experience some frequent urination and slight burning the first few times you urinate after the test. Rarely, the urine may be blood tinged. These are both due to catheter placements and resolve quickly.  . You should call our office immediately if you have signs of infection, which may include bladder pain, urinary urgency, fever, or burning during urination. .  Most people tolerate this test very well and experience only minimal discomfort during placement of catheters.  .  We do encourage you to drink plenty of water after the test.  You will have an appointment approximately one week after testing to discuss your results of this testing with your physician.      This follow up appointment is scheduled for 03/13/16 at 1400.   Please call our office if you have any concerns or need to reschedule this procedure. If you do not cancel your procedure 3 business days in advance, you will incur a $100.00 cancellation charge.  Thank you for allowing [place] to partner in your care and planning for your procedures.

## 2016-02-24 ENCOUNTER — Ambulatory Visit (INDEPENDENT_AMBULATORY_CARE_PROVIDER_SITE_OTHER): Payer: BLUE CROSS/BLUE SHIELD

## 2016-02-24 VITALS — BP 124/78 | HR 64 | Resp 16 | Ht 66.0 in | Wt 149.0 lb

## 2016-02-24 DIAGNOSIS — Z01812 Encounter for preprocedural laboratory examination: Secondary | ICD-10-CM

## 2016-02-24 LAB — POCT URINALYSIS DIPSTICK
Bilirubin, UA: NEGATIVE
Blood, UA: NEGATIVE
Glucose, UA: NEGATIVE
Ketones, UA: NEGATIVE
Leukocytes, UA: NEGATIVE
Nitrite, UA: NEGATIVE
Protein, UA: NEGATIVE
Urobilinogen, UA: NEGATIVE
pH, UA: 7

## 2016-02-24 NOTE — Progress Notes (Signed)
Patient in office for a UA before Urodynamics. Patient is doing well with no complaints at the time of visit. Urine obtained and dipstick was negative. Results in EPIC.   Closing encounter.

## 2016-03-01 ENCOUNTER — Ambulatory Visit (INDEPENDENT_AMBULATORY_CARE_PROVIDER_SITE_OTHER): Payer: BLUE CROSS/BLUE SHIELD | Admitting: *Deleted

## 2016-03-01 DIAGNOSIS — N993 Prolapse of vaginal vault after hysterectomy: Secondary | ICD-10-CM

## 2016-03-01 DIAGNOSIS — N8110 Cystocele, unspecified: Secondary | ICD-10-CM | POA: Diagnosis not present

## 2016-03-01 NOTE — Progress Notes (Signed)
Stephanie Armstrong is a 60 y.o. female Who presents today for urodynamics testing, ordered by Dr. Quincy Simmonds.   Allergies and medications reviewed.  Denies complaints today. No urinary complaints.   Urine Micro exam: negative for WBC's or RBC's, okay to proceed per Dr. Quincy Simmonds.  Patient reports fecal incontinence.  Urodynamics testing initiated. Lumax Bladder Catheter #10 [place] and lumax Abdominal Catheter #10 [place].   Post void residual 50 ml.   Urethral catheter placed without issue. Rectal catheter placed without issue.   Urodynamics testing completed. Please see scanned Patient summary report in Epic. Procedure completed and patient tolerated well without complaints. Patient scheduled for follow up office visit with Dr. Quincy Simmonds to discuss results. Patient agreeable.   Patient given post procedure instructions:  You may have a mild bladder and rectal discomfort for a few hours after the test. You may experience some frequent urination and slight burning the first few times you urinate after the test. Rarely, the urine may be blood tinged. These are both due to catheter placements and resolve quickly. You should call our office immediately if you have signs of infection, which may include bladder pain, urinary urgency, fever, or burning during urination. We do encourage you to drink plenty of water after the test.

## 2016-03-13 ENCOUNTER — Ambulatory Visit (INDEPENDENT_AMBULATORY_CARE_PROVIDER_SITE_OTHER): Payer: BLUE CROSS/BLUE SHIELD | Admitting: Obstetrics and Gynecology

## 2016-03-13 ENCOUNTER — Encounter: Payer: Self-pay | Admitting: Obstetrics and Gynecology

## 2016-03-13 VITALS — BP 100/60 | HR 80 | Resp 16 | Ht 66.0 in | Wt 150.0 lb

## 2016-03-13 DIAGNOSIS — N393 Stress incontinence (female) (male): Secondary | ICD-10-CM

## 2016-03-13 DIAGNOSIS — N816 Rectocele: Secondary | ICD-10-CM

## 2016-03-13 DIAGNOSIS — N8110 Cystocele, unspecified: Secondary | ICD-10-CM

## 2016-03-13 DIAGNOSIS — N993 Prolapse of vaginal vault after hysterectomy: Secondary | ICD-10-CM | POA: Diagnosis not present

## 2016-03-13 NOTE — Progress Notes (Signed)
After consult with Dr Quincy Simmonds, patient is ready to proceed with surgery. Surgery is scheduled for 03-28-16 at 0730 at [facility]. Surgery instructions reviewed and printed copy provided to patient. See copy scanned to chart.

## 2016-03-13 NOTE — Progress Notes (Signed)
GYNECOLOGY  VISIT   HPI: 60 y.o.   Widowed  Caucasian  female   G3P3 with No LMP recorded. Patient has had a hysterectomy.   here for  Pre op for 03/28/16 ABDOMINO SACROCOLPOPEXY with possible Halban's culdoplasty ANTERIOR (CYSTOCELE) AND POSTERIOR REPAIR (RECTOCELE) TRANSVAGINAL TAPE (TVT) PROCEDURE exact midurethral sling CYSTOSCOPY    Has anxiety about her pelvic organ prolapse. 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  Using Citrucel to help with constipation issues.  Doing an enema once every 2 - 3 weeks.  Also had fecal incontinence. Did see Dr. Leighton Ruff, colon and rectal surgeon, who did not recommend doing surgery at this time for fecal incontinence.   No leak with cough, laugh or sneeze.  Voiding small amounts with voiding.   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 Tresa Moore at [facility].  Had urodynamic testing on 03/01/16. Uroflow - continuous.  Void:  488 cc.  PVR:  50 cc.  S1 483 cc, s2 649 cc, S3 729 cc. Stable CMG. VLPP:  67 cmH20 at 732 cc.  UPP:  30 cm H20.  Pressure flow:  Pdet max 102 cm H2O.  Voided volume 866 cc.   Previous dx of VAIN II.  Not sexually active for over year.  No partner.  GYNECOLOGIC HISTORY: No LMP recorded. Patient has had a hysterectomy. Contraception:  Hysterectomy  Menopausal hormone therapy:  Premarin vag cream  Last mammogram:  10/11/15 BIRADS1:neg  Last pap smear:   10/11/15 ASCUS. HR HPV:+Detected.  Colposcopy on 10/11/15 - no dysplasia.         OB History    Gravida Para Term Preterm AB Living   3 3       3    SAB TAB Ectopic Multiple Live Births                     Patient Active Problem List   Diagnosis Date Noted  . Carpal tunnel syndrome 07/02/2014  . Depression 01/12/2013  . Essential hypertension, benign 01/12/2013  . 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 Easy bruisability 06/26/2012  . Menopause 06/26/2012  . Insomnia 06/26/2012     Past Medical History:  Diagnosis Date  . Anxiety   . 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 History of vulvar dysplasia    VIN 2  . Hypertension   . IBS (irritable bowel syndrome)   . Rectocele   . Urethral pain    mass  . Urinary incontinence   . 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   . Wears contact lenses     Past Surgical History:  Procedure Laterality Date  . AUGMENTATION MAMMAPLASTY  APRIL 2013   BREAST IMPLANT CHANGED  . CYSTOSCOPY WITH BIOPSY Bilateral 11/03/2015   Procedure: CYSTOSCOPY WITH URETHRAL BIOPSY, FULGERATION, BILATERAL RETROGRADE PYELOGRAMS;  Surgeon: Alexis Frock, MD;  Location: [facility];  Service: Urology;  Laterality: Bilateral;  . MASS EXCISION N/A 11/03/2015   Procedure: EXCISION  URETHRAL MASS;  Surgeon: Alexis Frock, MD;  Location: [facility];  Service: Urology;  Laterality: N/A;  . TUBAL LIGATION    . VAGINAL HYSTERECTOMY  1999    Current Outpatient Prescriptions  Medication Sig Dispense Refill  . ALPRAZolam (XANAX) 1 MG tablet TAKE 1 TO 1 &1/2 TABLETS AT BEDTIME AS NEEDED . MAY FILL MONTHLY. [place]  tablet 5  . amLODipine-benazepril (LOTREL) 5-40 MG capsule TAKE (1) CAPSULE BY MOUTH ONCE DAILY. 30 capsule 5  . Biotin 1000 MCG tablet Take 1,000 mcg by mouth daily.    Marland Kitchen conjugated estrogens (PREMARIN) vaginal cream Insert 1/2 gram vaginally every night at bedtime for two weeks. After two weeks, decrease to 1/2 gram vaginally twice per week. 30 g 0  . hydrochlorothiazide (HYDRODIURIL) 25 MG tablet TAKE ONE TABLET BY MOUTH ONCE DAILY AS NEEDED FOR SWELLING. DO NOT EXCEED 3 PER WEEK. 30 tablet 5  . ibuprofen (ADVIL,MOTRIN) 600 MG tablet Take 600 mg by mouth every 6 (six) hours as needed for pain.    . Magnesium 200 MG TABS Take 1 tablet by mouth daily.    . methylcellulose (CITRUCEL) oral powder Take by mouth daily.    Marland Kitchen  senna-docusate (SENOKOT-S) 8.6-50 MG tablet Take 1 tablet by mouth 2 (two) times daily. While taking pain meds to prevent constipation. 30 tablet 0  . tretinoin (RETIN-A) 0.05 % cream APP PEA SIZED AMOUNT AA QHS AS TOLERATED  1   No current facility-administered medications for this visit.      ALLERGIES: Patient has no known allergies.  Family History  Problem Relation Age of Onset  . Hypertension Mother   . Cancer Father     MELANOMA  . Diabetes Father   . Hypertension Father   . Heart disease Father   . Cancer Sister     THYROID  . Breast cancer Paternal Grandmother   . Colon cancer Neg Hx     Social History   Social History  . Marital status: Widowed    Spouse name: N/A  . Number of children: N/A  . Years of education: N/A   Occupational History  . Not on file.   Social History Main Topics  . Smoking status: Never Smoker  . Smokeless tobacco: Never Used  . Alcohol use 6.6 - 7.8 oz/week    7 Glasses of wine, 4 - 6 Standard drinks or equivalent per week  . Drug use: No  . Sexual activity: No     Comment: Tubal/TVH--ovaries remain   Other Topics Concern  . Not on file   Social History Narrative  . No narrative on file    ROS:  Pertinent items are noted in HPI.  PHYSICAL EXAMINATION:    BP 100/60 (BP Location: Right Arm, Patient Position: Sitting, Cuff Size: Normal)   Pulse 80   Resp 16   Ht 5\' 6"  (1.676 m)   Wt 150 lb (68 kg)   BMI 24.21 kg/m     General appearance: alert, cooperative and appears stated age Head: Normocephalic, without obvious abnormality, atraumatic Neck: no adenopathy, supple, symmetrical, trachea midline and thyroid normal to inspection and palpation Lungs: clear to auscultation bilaterally Heart: regular rate and rhythm Abdomen: soft, non-tender, no masses,  no organomegaly Extremities: extremities normal, atraumatic, no cyanosis or edema Skin: Skin color, texture, turgor normal. No rashes or lesions Lymph nodes: Cervical,  supraclavicular, and axillary nodes normal. No abnormal inguinal nodes palpated Neurologic: Grossly normal  Pelvic: External genitalia:  no lesions              Urethra:  normal appearing urethra with no masses, tenderness or lesions              Bartholins and Skenes: normal                 Vagina: normal appearing vagina with normal color and  discharge, no lesions              Second degree cystocele and second degree rectocele, first degree apical prolapse.  Vagina              is a little short and the apex has palpable tunnels on the right and left.               Cervix: Absent.                Bimanual Exam:  Uterus:  Absent.              Adnexa: no mass, fullness, tenderness              Rectal exam: Yes.  .  Confirms.              Anus:  normal sphincter tone, no lesions  Chaperone was present for exam.  ASSESSMENT  Status post LAVH in her early 42s.  Ovaries remain.  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 Hx VIN II.  Colposcopy of vagina and vulva June 2017.  No atypia, dysplasia, or malignancy.  Status post excision of urethral condyloma.  PLAN  Discussion of anatomic findings of prolapse of anterior, apical, and posterior vaginal walls and shortened vaginal length.  I have recommended an abdominosacrocolpopexy, potential Halban's culdoplasty, and anterior and posterior colporrhaphy with midurethral sling and cystoscopy, bilateral salpingectomy and possible bilateral oophorectomy only if the ovaries are abnormal.  Risks, benefits, and alternatives reviewed.  We discussed benefits and risks of surgery which include but are not limited to bleeding, infection, damage to surrounding organs, ureteral damage, permanent mesh use which may cause erosion and exposure in the vagina, urethra, bladder or ureters, dyspareunia, slower voiding and urinary retention, possible need for prolonged catheterization, de novo overactive bladder symptoms, reoperation, recurrence of  prolapse and incontinence, DVT, PE, death, and reaction to anesthesia. Surgical recovery and expectations discussed.  The patient wishes to proceed.   An After Visit Summary was printed and given to the patient.  __25____ minutes face to face time of which over 50% was spent in counseling.

## 2016-03-16 NOTE — Progress Notes (Signed)
Addendum - Had urodynamic testing on 03/01/16.  Uroflow - continuous.  Void:  488 cc.  PVR:  50 cc.  S1 483 cc, s2 649 cc, S3 729 cc. Stable CMG. VLPP:  67 cmH20 at 732 cc.  UPP:  30 cm H20.  Pressure flow:  Pdet max 102 cm H2O.  Voided volume 866 cc.   Evidence of genuine stress incontinence.  Large bladder volumes.  High Pdet max I suspect due to the prolapse and partial obstruction.

## 2016-03-22 NOTE — Patient Instructions (Addendum)
Your procedure is scheduled on:  Tuesday, Dec. 12, 2017  Enter through the [employer] [facility] at:  6:00 AM  Pick up the phone at the desk and dial [phone].  Call this number if you have problems the morning of surgery: [phone].  Remember: Do NOT eat food or drink after:  Midnight Monday  Take these medicines the morning of surgery with a SIP OF WATER:  Hydrochlorothiazide  Stop ALL herbal medications at this time   Do NOT wear jewelry (body piercing), metal hair clips/bobby pins, make-up, or nail polish. Do NOT wear lotions, powders, or perfumes.  You may wear deodorant. Do NOT shave for 48 hours prior to surgery. Do NOT bring valuables to the hospital. Contacts, dentures, or bridgework may not be worn into surgery.  Leave suitcase in car.  After surgery it may be brought to your room.  For patients admitted to the hospital, checkout time is 11:00 AM the day of discharge.

## 2016-03-23 ENCOUNTER — Encounter (HOSPITAL_COMMUNITY): Payer: Self-pay

## 2016-03-23 ENCOUNTER — Encounter (HOSPITAL_COMMUNITY)
Admission: RE | Admit: 2016-03-23 | Discharge: 2016-03-23 | Disposition: A | Discharge status: Home / Self Care | Payer: BLUE CROSS/BLUE SHIELD | Source: Ambulatory Visit | Attending: Obstetrics and Gynecology | Admitting: Obstetrics and Gynecology

## 2016-03-23 DIAGNOSIS — Z01812 Encounter for preprocedural laboratory examination: Secondary | ICD-10-CM | POA: Insufficient documentation

## 2016-03-23 DIAGNOSIS — I10 Essential (primary) hypertension: Secondary | ICD-10-CM | POA: Insufficient documentation

## 2016-03-23 DIAGNOSIS — Z807 Family history of other malignant neoplasms of lymphoid, hematopoietic and related tissues: Secondary | ICD-10-CM | POA: Diagnosis not present

## 2016-03-23 DIAGNOSIS — Z833 Family history of diabetes mellitus: Secondary | ICD-10-CM | POA: Diagnosis not present

## 2016-03-23 DIAGNOSIS — N901 Moderate vulvar dysplasia: Secondary | ICD-10-CM | POA: Insufficient documentation

## 2016-03-23 DIAGNOSIS — F419 Anxiety disorder, unspecified: Secondary | ICD-10-CM | POA: Insufficient documentation

## 2016-03-23 DIAGNOSIS — G5600 Carpal tunnel syndrome, unspecified upper limb: Secondary | ICD-10-CM | POA: Insufficient documentation

## 2016-03-23 DIAGNOSIS — G4700 Insomnia, unspecified: Secondary | ICD-10-CM | POA: Diagnosis not present

## 2016-03-23 DIAGNOSIS — Z90710 Acquired absence of both cervix and uterus: Secondary | ICD-10-CM | POA: Insufficient documentation

## 2016-03-23 DIAGNOSIS — Z79899 Other long term (current) drug therapy: Secondary | ICD-10-CM | POA: Diagnosis not present

## 2016-03-23 DIAGNOSIS — N814 Uterovaginal prolapse, unspecified: Secondary | ICD-10-CM | POA: Diagnosis not present

## 2016-03-23 DIAGNOSIS — Z800 Family history of malignant neoplasm of digestive organs: Secondary | ICD-10-CM | POA: Diagnosis not present

## 2016-03-23 DIAGNOSIS — Z803 Family history of malignant neoplasm of breast: Secondary | ICD-10-CM | POA: Diagnosis not present

## 2016-03-23 DIAGNOSIS — Z9889 Other specified postprocedural states: Secondary | ICD-10-CM | POA: Diagnosis not present

## 2016-03-23 HISTORY — DX: Malignant (primary) neoplasm, unspecified: C80.1

## 2016-03-23 HISTORY — DX: Unspecified osteoarthritis, unspecified site: M19.90

## 2016-03-23 HISTORY — DX: Inflammatory liver disease, unspecified: K75.9

## 2016-03-23 LAB — COMPREHENSIVE METABOLIC PANEL
ALT: 18 U/L (ref 14–54)
AST: 20 U/L (ref 15–41)
Albumin: 4.3 g/dL (ref 3.5–5.0)
Alkaline Phosphatase: 61 U/L (ref 38–126)
Anion gap: 6 (ref 5–15)
BUN: 29 mg/dL — ABNORMAL HIGH (ref 6–20)
CO2: 27 mmol/L (ref 22–32)
Calcium: 9.6 mg/dL (ref 8.9–10.3)
Chloride: 103 mmol/L (ref 101–111)
Creatinine, Ser: 0.75 mg/dL (ref 0.44–1.00)
GFR calc Af Amer: >60 mL/min (ref >60)
GFR calc non Af Amer: >60 mL/min (ref >60)
Glucose, Bld: 93 mg/dL (ref 65–99)
Potassium: 3.9 mmol/L (ref 3.5–5.1)
Sodium: 136 mmol/L (ref 135–145)
Total Bilirubin: 0.8 mg/dL (ref 0.3–1.2)
Total Protein: 7 g/dL (ref 6.5–8.1)

## 2016-03-23 LAB — CBC
HCT: 41.8 % (ref 36.0–46.0)
Hemoglobin: 14.2 g/dL (ref 12.0–15.0)
MCH: 31.3 pg (ref 26.0–34.0)
MCHC: 34 g/dL (ref 30.0–36.0)
MCV: 92.3 fL (ref 78.0–100.0)
Platelets: 243 10*3/uL (ref 150–400)
RBC: 4.53 MIL/uL (ref 3.87–5.11)
RDW: 13 % (ref 11.5–15.5)
WBC: 5.3 10*3/uL (ref 4.0–10.5)

## 2016-03-27 MED ORDER — DEXTROSE 5 % IV SOLN
2.0000 g | INTRAVENOUS | Status: DC
  Filled 2016-03-27: qty 2

## 2016-03-27 NOTE — H&P (Signed)
Office Visit   03/13/2016 Hiram, MD  Obstetrics and Gynecology   Female bladder prolapse +3 more  Dx   Pre-op Exam; Referred by Mikey Kirschner, MD  Reason for Visit   Additional Documentation   Vitals:   BP 100/60 (BP Location: Right Arm, Patient Position: Sitting, Cuff Size: Normal)   Pulse 80   Resp 16   Ht 5\' 6"  (1.676 m)   Wt 150 lb (68 kg)   BMI 24.21 kg/m   BSA 1.78 m   Flowsheets:   Infectious Disease Screening,   Custom Formula Data,   MEWS Score,   Anthropometrics     Encounter Info:   Billing Info,   History,   Allergies,   Detailed Report     All Notes   Progress Notes by Huey Romans, RN at 03/13/2016 2:00 PM   Author: Huey Romans, RN Author Type: Registered Nurse Filed: 03/15/2016 10:37 PM  Note Status: Signed Cosign: Cosign Not Required Encounter Date: 03/13/2016 2:00 PM  Editor: Huey Romans, RN (Registered Nurse)    After consult with Dr Quincy Simmonds, patient is ready to proceed with surgery. Surgery is scheduled for 03-28-16 at 0730 at [facility]. Surgery instructions reviewed and printed copy provided to patient. See copy scanned to chart.      Progress Notes by Nunzio Cobbs, MD at 03/13/2016 2:00 PM   Author: Nunzio Cobbs, MD Author Type: Physician Filed: 03/15/2016 10:37 PM  Note Status: Signed Cosign: Cosign Not Required Encounter Date: 03/13/2016 2:00 PM  Editor: Nunzio Cobbs, MD (Physician)  Prior Versions: 1. Nunzio Cobbs, MD (Physician) at 03/13/2016 3:07 PM - Sign at close encounter   2. Polly Cobia, CMA (Physiological scientist) at 03/13/2016 2:13 PM - Sign at close encounter    GYNECOLOGY  VISIT   HPI: 60 y.o.   Widowed  Caucasian  female   G3P3 with No LMP recorded. Patient has had a hysterectomy.   here for  Pre op for 03/28/16 ABDOMINO SACROCOLPOPEXY with possible Halban's culdoplasty ANTERIOR (CYSTOCELE) AND  POSTERIOR REPAIR (RECTOCELE) TRANSVAGINAL TAPE (TVT) PROCEDURE exact midurethral sling CYSTOSCOPY                   Has anxiety about her pelvic organ prolapse. 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  Using Citrucel to help with constipation issues.  Doing an enema once every 2 - 3 weeks.  Also had fecal incontinence. Did see Dr. Leighton Ruff, colon and rectal surgeon, who did not recommend doing surgery at this time for fecal incontinence.   No leak with cough, laugh or sneeze.  Voiding small amounts with voiding.   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 Tresa Moore at [facility].  Had urodynamic testing on 03/01/16. Uroflow - continuous.  Void:  488 cc.  PVR:  50 cc.  S1 483 cc, s2 649 cc, S3 729 cc. Stable CMG. VLPP:  67 cmH20 at 732 cc.  UPP:  30 cm H20.  Pressure flow:  Pdet max 102 cm H2O.  Voided volume 866 cc.   Previous dx of VAIN II.  Not sexually active for over year.  No partner.  GYNECOLOGIC HISTORY: No LMP recorded. Patient has had a hysterectomy. Contraception:  Hysterectomy  Menopausal hormone therapy:  Premarin vag cream  Last mammogram:  10/11/15 BIRADS1:neg  Last pap smear:   10/11/15 ASCUS. HR HPV:+Detected.  Colposcopy on 10/11/15 - no dysplasia.                 OB History    Gravida Para Term Preterm AB Living   3 3       3    SAB TAB Ectopic Multiple Live Births                         Patient Active Problem List   Diagnosis Date Noted  . Carpal tunnel syndrome 07/02/2014  . Depression 01/12/2013  . Essential hypertension, benign 01/12/2013  . 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 Easy bruisability 06/26/2012  . Menopause 06/26/2012  . Insomnia 06/26/2012        Past Medical History:  Diagnosis Date  . Anxiety   . 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 History of vulvar dysplasia    VIN 2  . Hypertension   . IBS  (irritable bowel syndrome)   . Rectocele   . Urethral pain    mass  . Urinary incontinence   . 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   . Wears contact lenses          Past Surgical History:  Procedure Laterality Date  . AUGMENTATION MAMMAPLASTY  APRIL 2013   BREAST IMPLANT CHANGED  . CYSTOSCOPY WITH BIOPSY Bilateral 11/03/2015   Procedure: CYSTOSCOPY WITH URETHRAL BIOPSY, FULGERATION, BILATERAL RETROGRADE PYELOGRAMS;  Surgeon: Alexis Frock, MD;  Location: [facility];  Service: Urology;  Laterality: Bilateral;  . MASS EXCISION N/A 11/03/2015   Procedure: EXCISION  URETHRAL MASS;  Surgeon: Alexis Frock, MD;  Location: [facility];  Service: Urology;  Laterality: N/A;  . TUBAL LIGATION    . VAGINAL HYSTERECTOMY  1999          Current Outpatient Prescriptions  Medication Sig Dispense Refill  . ALPRAZolam (XANAX) 1 MG tablet TAKE 1 TO 1 &1/2 TABLETS AT BEDTIME AS NEEDED . MAY FILL MONTHLY. 45 tablet 5  . amLODipine-benazepril (LOTREL) 5-40 MG capsule TAKE (1) CAPSULE BY MOUTH ONCE DAILY. 30 capsule 5  . Biotin 1000 MCG tablet Take 1,000 mcg by mouth daily.    Marland Kitchen conjugated estrogens (PREMARIN) vaginal cream Insert 1/2 gram vaginally every night at bedtime for two weeks. After two weeks, decrease to 1/2 gram vaginally twice per week. 30 g 0  . hydrochlorothiazide (HYDRODIURIL) 25 MG tablet TAKE ONE TABLET BY MOUTH ONCE DAILY AS NEEDED FOR SWELLING. DO NOT EXCEED 3 PER WEEK. 30 tablet 5  . ibuprofen (ADVIL,MOTRIN) 600 MG tablet Take 600 mg by mouth every 6 (six) hours as needed for pain.    . Magnesium 200 MG TABS Take 1 tablet by mouth daily.    . methylcellulose (CITRUCEL) oral powder Take by mouth daily.    Marland Kitchen senna-docusate (SENOKOT-S) 8.6-50 MG tablet Take 1 tablet by mouth 2 (two) times daily. While taking pain meds to prevent constipation. 30 tablet 0  . tretinoin  (RETIN-A) 0.05 % cream APP PEA SIZED AMOUNT AA QHS AS TOLERATED  1   No current facility-administered medications for this visit.      ALLERGIES: Patient has no known allergies.        Family History  Problem Relation Age of Onset  . Hypertension Mother   . Cancer Father  MELANOMA  . Diabetes Father   . Hypertension Father   . Heart disease Father   . Cancer Sister     THYROID  . Breast cancer Paternal Grandmother   . Colon cancer Neg Hx     Social History        Social History  . Marital status: Widowed    Spouse name: N/A  . Number of children: N/A  . Years of education: N/A      Occupational History  . Not on file.         Social History Main Topics  . Smoking status: Never Smoker  . Smokeless tobacco: Never Used  . Alcohol use 6.6 - 7.8 oz/week    7 Glasses of wine, 4 - 6 Standard drinks or equivalent per week  . Drug use: No  . Sexual activity: No     Comment: Tubal/TVH--ovaries remain       Other Topics Concern  . Not on file      Social History Narrative  . No narrative on file    ROS:  Pertinent items are noted in HPI.  PHYSICAL EXAMINATION:    BP 100/60 (BP Location: Right Arm, Patient Position: Sitting, Cuff Size: Normal)   Pulse 80   Resp 16   Ht 5\' 6"  (1.676 m)   Wt 150 lb (68 kg)   BMI 24.21 kg/m     General appearance: alert, cooperative and appears stated age Head: Normocephalic, without obvious abnormality, atraumatic Neck: no adenopathy, supple, symmetrical, trachea midline and thyroid normal to inspection and palpation Lungs: clear to auscultation bilaterally Heart: regular rate and rhythm Abdomen: soft, non-tender, no masses,  no organomegaly Extremities: extremities normal, atraumatic, no cyanosis or edema Skin: Skin color, texture, turgor normal. No rashes or lesions Lymph nodes: Cervical, supraclavicular, and axillary nodes normal. No abnormal inguinal nodes palpated Neurologic:  Grossly normal  Pelvic: External genitalia:  no lesions              Urethra:  normal appearing urethra with no masses, tenderness or lesions              Bartholins and Skenes: normal                 Vagina: normal appearing vagina with normal color and discharge, no lesions              Second degree cystocele and second degree rectocele, first degree apical prolapse. Vagina              is a little short and the apex has palpable tunnels on the right and left.               Cervix: Absent.                Bimanual Exam:  Uterus:  Absent.              Adnexa: no mass, fullness, tenderness              Rectal exam: Yes.  .  Confirms.              Anus:  normal sphincter tone, no lesions  Chaperone was present for exam.  ASSESSMENT  Status post LAVH in her early 66s.  Ovaries remain.  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 Hx VIN II.  Colposcopy of vagina and vulva June 2017. No atypia, dysplasia, or malignancy.  Status post excision of urethral condyloma.  PLAN  Discussion  of anatomic findings of prolapse of anterior, apical, and posterior vaginal walls and shortened vaginal length.  I have recommended an abdominosacrocolpopexy, potential Halban's culdoplasty, and anterior and posterior colporrhaphy with midurethral sling and cystoscopy, bilateral salpingectomy and possible bilateral oophorectomy only if the ovaries are abnormal.  Risks, benefits, and alternatives reviewed.  We discussed benefits and risks of surgery which include but are not limited to bleeding, infection, damage to surrounding organs, ureteral damage, permanent mesh use which may cause erosion and exposure in the vagina, urethra, bladder or ureters, dyspareunia, slower voiding and urinary retention, possible need for prolonged catheterization, de novo overactive bladder symptoms, reoperation, recurrence of prolapse and incontinence, DVT, PE, death, and reaction to anesthesia. Surgical recovery  and expectations discussed.  The patient wishes to proceed.   An After Visit Summary was printed and given to the patient.  __25____ minutes face to face time of which over 50% was spent in counseling.

## 2016-03-28 ENCOUNTER — Encounter (HOSPITAL_COMMUNITY): Payer: Self-pay

## 2016-03-28 ENCOUNTER — Inpatient Hospital Stay (HOSPITAL_COMMUNITY): Payer: BLUE CROSS/BLUE SHIELD | Admitting: Anesthesiology

## 2016-03-28 ENCOUNTER — Encounter (HOSPITAL_COMMUNITY)
Admission: RE | Disposition: A | Discharge status: Home / Self Care | Payer: Self-pay | Source: Ambulatory Visit | Attending: Obstetrics and Gynecology

## 2016-03-28 ENCOUNTER — Inpatient Hospital Stay (HOSPITAL_COMMUNITY)
Admission: RE | Admit: 2016-03-28 | Discharge: 2016-03-30 | DRG: 743 | Disposition: A | Discharge status: Home / Self Care | Payer: BLUE CROSS/BLUE SHIELD | Source: Ambulatory Visit | Attending: Obstetrics and Gynecology | Admitting: Obstetrics and Gynecology

## 2016-03-28 DIAGNOSIS — I10 Essential (primary) hypertension: Secondary | ICD-10-CM | POA: Diagnosis not present

## 2016-03-28 DIAGNOSIS — Z9889 Other specified postprocedural states: Secondary | ICD-10-CM

## 2016-03-28 DIAGNOSIS — R159 Full incontinence of feces: Secondary | ICD-10-CM | POA: Diagnosis present

## 2016-03-28 DIAGNOSIS — N993 Prolapse of vaginal vault after hysterectomy: Principal | ICD-10-CM | POA: Diagnosis present

## 2016-03-28 DIAGNOSIS — D282 Benign neoplasm of uterine tubes and ligaments: Secondary | ICD-10-CM | POA: Diagnosis not present

## 2016-03-28 DIAGNOSIS — R200 Anesthesia of skin: Secondary | ICD-10-CM | POA: Diagnosis not present

## 2016-03-28 DIAGNOSIS — N393 Stress incontinence (female) (male): Secondary | ICD-10-CM | POA: Diagnosis not present

## 2016-03-28 DIAGNOSIS — N891 Moderate vaginal dysplasia: Secondary | ICD-10-CM | POA: Diagnosis present

## 2016-03-28 DIAGNOSIS — N8110 Cystocele, unspecified: Secondary | ICD-10-CM | POA: Diagnosis not present

## 2016-03-28 DIAGNOSIS — N816 Rectocele: Secondary | ICD-10-CM | POA: Diagnosis not present

## 2016-03-28 HISTORY — PX: ABDOMINAL SACROCOLPOPEXY: SHX1114

## 2016-03-28 HISTORY — PX: BLADDER SUSPENSION: SHX72

## 2016-03-28 HISTORY — PX: CYSTOSCOPY: SHX5120

## 2016-03-28 HISTORY — PX: BILATERAL SALPINGECTOMY: SHX5743

## 2016-03-28 HISTORY — PX: ANTERIOR AND POSTERIOR REPAIR: SHX5121

## 2016-03-28 HISTORY — PX: LYSIS OF ADHESION: SHX5961

## 2016-03-28 SURGERY — SACROCOLPOPEXY, ABDOMINAL
Anesthesia: General | Site: Vagina

## 2016-03-28 MED ORDER — PROPOFOL 10 MG/ML IV BOLUS
INTRAVENOUS | Status: DC | PRN
  Administered 2016-03-28: 150 mg via INTRAVENOUS

## 2016-03-28 MED ORDER — DEXTROSE 5 % IV SOLN
INTRAVENOUS | Status: DC | PRN
  Administered 2016-03-28: 2 g via INTRAVENOUS

## 2016-03-28 MED ORDER — SCOPOLAMINE 1 MG/3DAYS TD PT72
MEDICATED_PATCH | TRANSDERMAL | Status: AC
  Administered 2016-03-28: 1.5 mg via TRANSDERMAL
  Filled 2016-03-28: qty 1

## 2016-03-28 MED ORDER — ESTRADIOL 0.1 MG/GM VA CREA
TOPICAL_CREAM | VAGINAL | Status: DC | PRN
  Administered 2016-03-28: 1 via VAGINAL

## 2016-03-28 MED ORDER — LACTATED RINGERS IV SOLN
INTRAVENOUS | Status: DC
  Administered 2016-03-28 (×3): via INTRAVENOUS

## 2016-03-28 MED ORDER — STERILE WATER FOR IRRIGATION IR SOLN
Status: DC | PRN
  Administered 2016-03-28: 1000 mL via INTRAVESICAL

## 2016-03-28 MED ORDER — KETOROLAC TROMETHAMINE 15 MG/ML IJ SOLN
15.0000 mg | Freq: Four times a day (QID) | INTRAMUSCULAR | Status: DC
  Filled 2016-03-28 (×8): qty 1

## 2016-03-28 MED ORDER — EPHEDRINE SULFATE 50 MG/ML IJ SOLN
INTRAMUSCULAR | Status: DC | PRN
  Administered 2016-03-28 (×4): 5 mg via INTRAVENOUS

## 2016-03-28 MED ORDER — KETOROLAC TROMETHAMINE 30 MG/ML IJ SOLN
INTRAMUSCULAR | Status: DC | PRN
  Administered 2016-03-28: 30 mg via INTRAVENOUS

## 2016-03-28 MED ORDER — MORPHINE SULFATE 2 MG/ML IV SOLN
INTRAVENOUS | Status: DC
  Administered 2016-03-28: 15:00:00 via INTRAVENOUS
  Administered 2016-03-29 (×2): 2 mg via INTRAVENOUS
  Filled 2016-03-28: qty 25

## 2016-03-28 MED ORDER — NALOXONE HCL 0.4 MG/ML IJ SOLN: 0.4000 mg | INTRAMUSCULAR | Status: DC | PRN

## 2016-03-28 MED ORDER — DIPHENHYDRAMINE HCL 12.5 MG/5ML PO ELIX
12.5000 mg | ORAL_SOLUTION | Freq: Four times a day (QID) | ORAL | Status: DC | PRN
  Filled 2016-03-28: qty 5

## 2016-03-28 MED ORDER — PHENYLEPHRINE HCL 10 MG/ML IJ SOLN
INTRAMUSCULAR | Status: DC | PRN
  Administered 2016-03-28 (×2): 80 ug via INTRAVENOUS

## 2016-03-28 MED ORDER — ALPRAZOLAM 0.5 MG PO TABS
1.0000 mg | ORAL_TABLET | Freq: Every evening | ORAL | Status: DC | PRN
  Administered 2016-03-28 – 2016-03-29 (×2): 1.5 mg via ORAL
  Filled 2016-03-28 (×2): qty 3

## 2016-03-28 MED ORDER — ONDANSETRON HCL 4 MG/2ML IJ SOLN: 4.0000 mg | Freq: Four times a day (QID) | INTRAMUSCULAR | Status: DC | PRN

## 2016-03-28 MED ORDER — MEPERIDINE HCL 25 MG/ML IJ SOLN: 6.2500 mg | INTRAMUSCULAR | Status: DC | PRN

## 2016-03-28 MED ORDER — DIPHENHYDRAMINE HCL 50 MG/ML IJ SOLN: 12.5000 mg | Freq: Four times a day (QID) | INTRAMUSCULAR | Status: DC | PRN

## 2016-03-28 MED ORDER — ROCURONIUM BROMIDE 100 MG/10ML IV SOLN
INTRAVENOUS | Status: DC | PRN
  Administered 2016-03-28 (×2): 20 mg via INTRAVENOUS
  Administered 2016-03-28: 50 mg via INTRAVENOUS
  Administered 2016-03-28: 30 mg via INTRAVENOUS
  Administered 2016-03-28: 20 mg via INTRAVENOUS

## 2016-03-28 MED ORDER — IBUPROFEN 600 MG PO TABS
600.0000 mg | ORAL_TABLET | Freq: Four times a day (QID) | ORAL | Status: DC | PRN
  Administered 2016-03-30: 600 mg via ORAL
  Filled 2016-03-28: qty 1

## 2016-03-28 MED ORDER — MIDAZOLAM HCL 2 MG/2ML IJ SOLN
INTRAMUSCULAR | Status: DC | PRN
  Administered 2016-03-28: 2 mg via INTRAVENOUS

## 2016-03-28 MED ORDER — HYDROMORPHONE HCL 1 MG/ML IJ SOLN
0.2500 mg | INTRAMUSCULAR | Status: DC | PRN
  Administered 2016-03-28 (×2): 0.5 mg via INTRAVENOUS

## 2016-03-28 MED ORDER — LACTATED RINGERS IV SOLN
INTRAVENOUS | Status: DC
  Administered 2016-03-28 – 2016-03-29 (×2): via INTRAVENOUS

## 2016-03-28 MED ORDER — HYDROMORPHONE HCL 1 MG/ML IJ SOLN
INTRAMUSCULAR | Status: AC
  Filled 2016-03-28: qty 1

## 2016-03-28 MED ORDER — KETOROLAC TROMETHAMINE 30 MG/ML IJ SOLN
15.0000 mg | Freq: Four times a day (QID) | INTRAMUSCULAR | Status: AC
  Administered 2016-03-28 – 2016-03-29 (×4): 15 mg via INTRAVENOUS
  Filled 2016-03-28 (×4): qty 1

## 2016-03-28 MED ORDER — DEXAMETHASONE SODIUM PHOSPHATE 10 MG/ML IJ SOLN
INTRAMUSCULAR | Status: DC | PRN
  Administered 2016-03-28: 4 mg via INTRAVENOUS

## 2016-03-28 MED ORDER — ONDANSETRON HCL 4 MG/2ML IJ SOLN
INTRAMUSCULAR | Status: DC | PRN
  Administered 2016-03-28: 4 mg via INTRAVENOUS

## 2016-03-28 MED ORDER — ONDANSETRON HCL 4 MG PO TABS: 4.0000 mg | ORAL_TABLET | Freq: Four times a day (QID) | ORAL | Status: DC | PRN

## 2016-03-28 MED ORDER — LACTATED RINGERS IV BOLUS (SEPSIS)
500.0000 mL | Freq: Once | INTRAVENOUS | Status: AC
  Administered 2016-03-28: 500 mL via INTRAVENOUS

## 2016-03-28 MED ORDER — ONDANSETRON HCL 4 MG/2ML IJ SOLN: 4.0000 mg | Freq: Once | INTRAMUSCULAR | Status: DC | PRN

## 2016-03-28 MED ORDER — ESTRADIOL 0.1 MG/GM VA CREA
TOPICAL_CREAM | VAGINAL | Status: AC
  Filled 2016-03-28: qty 42.5

## 2016-03-28 MED ORDER — LACTATED RINGERS IV SOLN
INTRAVENOUS | Status: DC
  Administered 2016-03-28: 125 mL/h via INTRAVENOUS

## 2016-03-28 MED ORDER — MIDAZOLAM HCL 2 MG/2ML IJ SOLN
INTRAMUSCULAR | Status: AC
  Filled 2016-03-28: qty 2

## 2016-03-28 MED ORDER — SUGAMMADEX SODIUM 200 MG/2ML IV SOLN
INTRAVENOUS | Status: DC | PRN
  Administered 2016-03-28: 150 mg via INTRAVENOUS

## 2016-03-28 MED ORDER — SCOPOLAMINE 1 MG/3DAYS TD PT72
1.0000 | MEDICATED_PATCH | Freq: Once | TRANSDERMAL | Status: DC
  Administered 2016-03-28: 1.5 mg via TRANSDERMAL

## 2016-03-28 MED ORDER — OXYCODONE-ACETAMINOPHEN 5-325 MG PO TABS
1.0000 | ORAL_TABLET | ORAL | Status: DC | PRN
  Administered 2016-03-29: 1 via ORAL
  Administered 2016-03-29: 2 via ORAL
  Administered 2016-03-29: 1 via ORAL
  Administered 2016-03-29: 2 via ORAL
  Administered 2016-03-29: 1 via ORAL
  Administered 2016-03-30 (×2): 2 via ORAL
  Filled 2016-03-28 (×5): qty 2
  Filled 2016-03-28 (×2): qty 1

## 2016-03-28 MED ORDER — FENTANYL CITRATE (PF) 100 MCG/2ML IJ SOLN
INTRAMUSCULAR | Status: AC
  Filled 2016-03-28: qty 2

## 2016-03-28 MED ORDER — LIDOCAINE-EPINEPHRINE 1 %-1:100000 IJ SOLN
INTRAMUSCULAR | Status: AC
  Filled 2016-03-28: qty 1

## 2016-03-28 MED ORDER — SODIUM CHLORIDE 0.9% FLUSH: 9.0000 mL | INTRAVENOUS | Status: DC | PRN

## 2016-03-28 MED ORDER — MENTHOL 3 MG MT LOZG
1.0000 | LOZENGE | OROMUCOSAL | Status: DC | PRN
  Filled 2016-03-28: qty 9

## 2016-03-28 MED ORDER — LIDOCAINE HCL (CARDIAC) 20 MG/ML IV SOLN
INTRAVENOUS | Status: DC | PRN
  Administered 2016-03-28: 100 mg via INTRAVENOUS

## 2016-03-28 MED ORDER — FENTANYL CITRATE (PF) 250 MCG/5ML IJ SOLN
INTRAMUSCULAR | Status: AC
  Filled 2016-03-28: qty 5

## 2016-03-28 MED ORDER — LIDOCAINE-EPINEPHRINE 1 %-1:100000 IJ SOLN
INTRAMUSCULAR | Status: DC | PRN
Start: 2016-03-28 — End: 2016-03-28
  Administered 2016-03-28: 8 mL

## 2016-03-28 MED ORDER — HYDROMORPHONE HCL 1 MG/ML IJ SOLN
INTRAMUSCULAR | Status: AC
Start: 2016-03-28 — End: 2016-03-29
  Filled 2016-03-28: qty 1

## 2016-03-28 MED ORDER — FENTANYL CITRATE (PF) 100 MCG/2ML IJ SOLN
INTRAMUSCULAR | Status: DC | PRN
Start: 2016-03-28 — End: 2016-03-28
  Administered 2016-03-28: 100 ug via INTRAVENOUS
  Administered 2016-03-28 (×5): 50 ug via INTRAVENOUS

## 2016-03-28 SURGICAL SUPPLY — 77 items
ADH SKN CLS APL DERMABOND .7 (GAUZE/BANDAGES/DRESSINGS) ×4
APL SRG 38 LTWT LNG FL B (MISCELLANEOUS)
APPLICATOR ARISTA FLEXITIP XL (MISCELLANEOUS) IMPLANT
BLADE SURG 11 STRL SS (BLADE) ×5 IMPLANT
BLADE SURG 15 STRL LF C SS BP (BLADE) ×4 IMPLANT
BLADE SURG 15 STRL SS (BLADE) ×5
CANISTER SUCT 3000ML (MISCELLANEOUS) ×5 IMPLANT
CATH FOLEY 2WAY SLVR  5CC 18FR (CATHETERS) ×1
CATH FOLEY 2WAY SLVR 5CC 18FR (CATHETERS) ×4 IMPLANT
CLOTH BEACON ORANGE TIMEOUT ST (SAFETY) ×5 IMPLANT
CONT PATH 16OZ SNAP LID 3702 (MISCELLANEOUS) IMPLANT
DECANTER SPIKE VIAL GLASS SM (MISCELLANEOUS) ×1 IMPLANT
DERMABOND ADVANCED (GAUZE/BANDAGES/DRESSINGS) ×1
DERMABOND ADVANCED .7 DNX12 (GAUZE/BANDAGES/DRESSINGS) ×4 IMPLANT
DEVICE CAPIO SLIM SINGLE (INSTRUMENTS) IMPLANT
DISSECTOR SPONGE CHERRY (GAUZE/BANDAGES/DRESSINGS) ×5 IMPLANT
DRAPE UNDERBUTTOCKS STRL (DRAPE) ×5 IMPLANT
DRAPE WARM FLUID 44X44 (DRAPE) ×5 IMPLANT
DRSG OPSITE POSTOP 4X10 (GAUZE/BANDAGES/DRESSINGS) ×5 IMPLANT
DURAPREP 26ML APPLICATOR (WOUND CARE) ×5 IMPLANT
ELECT BLADE 6.5 EXT (BLADE) ×5 IMPLANT
FILTER STRAW FLUID ASPIR (MISCELLANEOUS) IMPLANT
GAUZE PACKING 1 X5 YD ST (GAUZE/BANDAGES/DRESSINGS) ×1 IMPLANT
GAUZE PACKING 2X5 YD STRL (GAUZE/BANDAGES/DRESSINGS) ×4 IMPLANT
GAUZE SPONGE 4X4 16PLY XRAY LF (GAUZE/BANDAGES/DRESSINGS) ×5 IMPLANT
GLOVE BIO SURGEON STRL SZ 6.5 (GLOVE) ×10 IMPLANT
GLOVE BIOGEL PI IND STRL 6.5 (GLOVE) ×4 IMPLANT
GLOVE BIOGEL PI IND STRL 7.0 (GLOVE) ×16 IMPLANT
GLOVE BIOGEL PI INDICATOR 6.5 (GLOVE) ×2
GLOVE BIOGEL PI INDICATOR 7.0 (GLOVE) ×4
GOWN STRL REUS W/TWL LRG LVL3 (GOWN DISPOSABLE) ×20 IMPLANT
HEMOSTAT ARISTA ABSORB 3G PWDR (MISCELLANEOUS) IMPLANT
LEGGING LITHOTOMY PAIR STRL (DRAPES) ×5 IMPLANT
MESH RESTORELLE Y CONTOUR (Mesh General) ×1 IMPLANT
NDL MAYO 6 CRC TAPER PT (NEEDLE) IMPLANT
NEEDLE HYPO 22GX1.5 SAFETY (NEEDLE) ×5 IMPLANT
NEEDLE MAYO 6 CRC TAPER PT (NEEDLE) IMPLANT
NS IRRIG 1000ML POUR BTL (IV SOLUTION) ×5 IMPLANT
PACK ABDOMINAL GYN (CUSTOM PROCEDURE TRAY) ×5 IMPLANT
PACK TRENDGUARD 450 HYBRID PRO (MISCELLANEOUS) IMPLANT
PACK TRENDGUARD 600 HYBRD PROC (MISCELLANEOUS) IMPLANT
PACK VAGINAL MINOR WOMEN LF (CUSTOM PROCEDURE TRAY) ×4 IMPLANT
PACK VAGINAL WOMENS (CUSTOM PROCEDURE TRAY) ×4 IMPLANT
PAD MAGNETIC INST (MISCELLANEOUS) ×5 IMPLANT
PLUG CATH AND CAP STER (CATHETERS) ×5 IMPLANT
RTRCTR C-SECT PINK 25CM LRG (MISCELLANEOUS) ×5 IMPLANT
SET CYSTO W/LG BORE CLAMP LF (SET/KITS/TRAYS/PACK) ×5 IMPLANT
SHEET LAVH (DRAPES) ×5 IMPLANT
SLING TVT EXACT (Sling) ×1 IMPLANT
SPONGE LAP 18X18 X RAY DECT (DISPOSABLE) ×5 IMPLANT
SPONGE SURGIFOAM ABS GEL 12-7 (HEMOSTASIS) IMPLANT
SURGIFLO TRUKIT (HEMOSTASIS) IMPLANT
SURGIFLO W/THROMBIN 8M KIT (HEMOSTASIS) IMPLANT
SUT CAPIO ETHIBPND (SUTURE) IMPLANT
SUT ETHIBOND 0 (SUTURE) ×5 IMPLANT
SUT PLAIN 2 0 XLH (SUTURE) ×5 IMPLANT
SUT VIC AB 0 CT1 18XCR BRD8 (SUTURE) ×4 IMPLANT
SUT VIC AB 0 CT1 27 (SUTURE) ×15
SUT VIC AB 0 CT1 27XBRD ANBCTR (SUTURE) ×12 IMPLANT
SUT VIC AB 0 CT1 8-18 (SUTURE) ×10
SUT VIC AB 2-0 CT1 (SUTURE) ×6 IMPLANT
SUT VIC AB 2-0 CT1 27 (SUTURE) ×15
SUT VIC AB 2-0 CT1 TAPERPNT 27 (SUTURE) IMPLANT
SUT VIC AB 2-0 CT2 27 (SUTURE) IMPLANT
SUT VIC AB 2-0 SH 27 (SUTURE) ×10
SUT VIC AB 2-0 SH 27XBRD (SUTURE) ×16 IMPLANT
SUT VIC AB 2-0 UR6 27 (SUTURE) IMPLANT
SUT VIC AB 4-0 PS2 27 (SUTURE) ×5 IMPLANT
TOWEL OR 17X24 6PK STRL BLUE (TOWEL DISPOSABLE) ×10 IMPLANT
TRAY FOLEY CATH SILVER 14FR (SET/KITS/TRAYS/PACK) ×5 IMPLANT
TRAY FOLEY CATH SILVER 16FR (SET/KITS/TRAYS/PACK) ×5 IMPLANT
TRENDGUARD 450 HYBRID PRO PACK (MISCELLANEOUS) ×5
TRENDGUARD 600 HYBRID PROC PK (MISCELLANEOUS) ×5
TUBING CONNECTING 10 (TUBING) ×1 IMPLANT
TUBING NON-CON 1/4 X 20 CONN (TUBING) ×4 IMPLANT
WATER STERILE IRR 1000ML POUR (IV SOLUTION) ×5 IMPLANT
WATER STERILE IRR 1000ML UROMA (IV SOLUTION) ×2 IMPLANT

## 2016-03-28 NOTE — Addendum Note (Signed)
Addendum  created 03/28/16 1414 by Lillia Abed, MD   Sign clinical note

## 2016-03-28 NOTE — Progress Notes (Signed)
Day of Surgery Procedure(s) (LRB): ABDOMINO SACROCOLPOPEXY (N/A) ANTERIOR (CYSTOCELE) AND POSTERIOR REPAIR (RECTOCELE) (N/A) TRANSVAGINAL TAPE (TVT) PROCEDURE exact midurethral sling (N/A) CYSTOSCOPY (N/A) BILATERAL SALPINGECTOMY (Bilateral) LYSIS OF ADHESION (N/A)  Subjective: Patient reports goo pain control.  Had chest pressure while in the PACU.  Had an EKG showing prolonged interval of QT wave.  No signs of ST segment elevation or depression, so OK'd by anesthesiology to go to the floor. Symptoms are resolved per patient.  Denies shortness of breath.  Hungry.  Some numbness in left hand.  States she has a history of possible carpal tunnel syndrome.   Objective: I have reviewed patient's vital signs, intake and output and  EKG.   Vitals:   03/28/16 1452 03/28/16 1500  BP: 110/62 (!) 118/59  Pulse: 70 74  Resp: 18 18  Temp:     I/O - 2400 cc/525 cc.  Urine concentrated.   O2 sat 100% on room air.  General: alert and cooperative Resp: clear to auscultation bilaterally Cardio: regular rate and rhythm, S1, S2 normal, no murmur, click, rub or gallop GI: soft, non-tender; bowel sounds normal; no masses,  no organomegaly and incision: clean, dry and intact Extremities: TED hose and PAS on.  DPs 2+ bilaterally.  Vaginal Bleeding: minimal Moving all extremities spontaneously.  Strength 5/5 both upper extremities.   Assessment: s/p Procedure(s): ABDOMINO SACROCOLPOPEXY (N/A) ANTERIOR (CYSTOCELE) AND POSTERIOR REPAIR (RECTOCELE) (N/A) TRANSVAGINAL TAPE (TVT) PROCEDURE exact midurethral sling (N/A) CYSTOSCOPY (N/A) BILATERAL SALPINGECTOMY (Bilateral) LYSIS OF ADHESION (N/A): stable  Plan: Advance diet Foley to gravity due to post op state.  Morphine PCA and Toradol for pain control.  IVF bolus.  Surgical findings and procedure reviewed with patient.    LOS: 0 days    Arloa Koh 03/28/2016, 4:00 PM

## 2016-03-28 NOTE — Op Note (Addendum)
OPERATIVE REPORT   PREOPERATIVE DIAGNOSIS:  Cystocele, rectocele, vaginal vault prolapse following hysterectomy.  POSTOPERATIVE DIAGNOSIS:  Cystocele, rectocele, vaginal vault prolapse following hysterectomy, pelvic adhesions.   PROCEDURE:  Abdominal sacrocolpopexy, lysis of adhesions, bilateral salpingectomy, TVT Exact midurethral sling and cystoscopy, posterior colporrhaphy.  SURGEON: Lenard Galloway, MD  ASSISTANT:  Dorothy Spark, MD  ANESTHESIA: General endotracheal.  IV FLUIDS: 2400 cc Ringer's lactate.  EBL: 150 cc.  URINE OUTPUT:375 cc.  COMPLICATIONS: None.  INDICATIONS FOR THE PROCEDURE: The patient is a 60 year old [place] female, who presents with a rectocele and cystocele,  and a request for surgical care. On physical exam, the patient has a second degree cystocele and second degree rectocele.  Her vaginal vault prolapses to a first degree and is foreshortened following a laparoscopically assisted vaginal hysterectomy. The patient has not been experiencing urinary incontinence, but with multichannel urodynamic testing in the office she did demonstrate  stress incontinence. A plan is now made to proceed with a bilateral salpingectomy,  abdominal sacrocolpopexy, potential Halban's culdoplasty, TVT Exact mid urethral  sling, and anterior and posterior colporrhaphy. Risks, benefits, and alternatives have been reviewed with the patient who wishes to proceed.  FINDINGS: Examination under anesthesia revealed a second degree cystocele and second degree rectocele with a first degree vaginal vault  Prolapse. At the time of laparotomy, the patient was noted to have  rectosigmoid colon adhesions to the vaginal vault and the left pelvic sidewall. There uterus was absent.  The bilateral tubes were consistent with tubal  ligation. The ovaries were unremarkable. The appendix was unremarkable.    Palpation of the upper abdomen was unremarkable.    Cystoscopy at termination of the bladder portion of the procedure documented the bladder to be intact throughout 360 degrees including the bladder dome and trigone. There was no evidence of a foreign body in the bladder or the urethra. The ureters were patent bilaterally.  Final rectal exam demonstrated no foreign body or mass in the rectum.  SPECIMENS: The  bilateral tubes were sent to Pathology.  PROCEDURE IN DETAIL: The patient was reidentified in the preop hold area. She did receive cefotetan 2 g IV for antibiotic prophylaxis. She received both TED hose and PAS stockings for DVT prophylaxis.  The patient was escorted to the operating room, where she was placed in the dorsal lithotomy position in [place]. General endotracheal anesthesia was induced. The patient's lower abdomen, vagina, and perineum were sterilely prepped and she was sterilely draped. The Foley catheter was placed inside the bladder and left to gravity drainage throughout and at the termination of the procedure.  The procedure began by creating a Pfannenstiel incision with a scalpel. The incision was carried down to the subcutaneous tissues using monopolar cautery for hemostasis. The fascia was incised in the midline with a scalpel and the incision was extended bilaterally using the Mayo scissors. The rectus muscles were bluntly divided in the midline. The parietal peritoneum was elevated with 2 hemostat clamps and entered sharply with the Metzenbaum scissors. The peritoneal incision was extended cranially and caudally.  An Alexis retractor was then placed inside the peritoneal cavity and the patient was placed in the Trendelenburg position. The bowel was packed into the upper abdomen with moistened lap pads.  The procedure began by performing lysis of adhesions of the rectosigmoid colon away from the vaginal apex and the left pelvic sidewall.  This was  performed with a Metzenbaum  scissors and with monopolar cautery.  Hemostasis was good.  The bilateral salpingectomy was performed by placing a Kelly clamp across the distal mesosalpinx and sharply diving the pedicle.  The pedicle was then  free tied with 0/0 Vicryl and placing a suture ligature of he same.  The remaining mesosalpinx pedicle on the right was cauterized with monopolar  Cautery, and the specimen was removed.  On the patient's left, it was again free tied and then sutured with 0/0 Vicryl.  The left tube was sent to  Pathology.   The abdominal sacrocolpopexy was performed. An EEA Sizer was placed in the vagina at this time and the peritoneum along the anterior and posterior vaginal walls was opened and dissection performed down to the level of the underside of the vagina, both anteriorly and posteriorly.  Attention at this time was turned to the sacral promontory. The peritoneum overlying the sacral promontory was identified along with the landmarks of the bifurcation of the aorta and iliac vessels and the right ureter. The peritoneum was opened using a combination of a Metzenbaum scissors and monopolar cautery. Careful dissection was performed down to the level of the sacral promontory. The anterior longitudinal ligament was identified along with the middle sacral vessels. Hemostasis was good. The peritoneum was then opened inferiorly along the patient's right pelvic sidewall and all the way down to the level of dissection along the posterior vaginal peritoneal incision.  At this time, the Colpoplast mesh was trimmed to properly fit the patient's anatomy and the mesh was secured to the anterior vagina using a series of four 0 Ethibond sutures. The same was performed along the posterior vaginal wall.  The EEA Sizer was removed and a hand was placed inside the vagina and the elevation of the vaginal cuff was simulated. The point of attachment of the Colpoplast mesh was then mapped to  the sacral promontory and 2 sutures of 0 Ethibond were brought through the anterior longitudinal ligament to the left of the middle sacral vessels. These sutures were then brought through the mesh. The sutures were tied. Excess mesh was trimmed at this time. The mesh was re- peritonealized by closing the retroperitoneal space using 2-0 Vicryl suture. 2-0 Vicryl suture was also used to close the peritoneum over the vaginal cuff. This allowed 100% coverage of the mesh material.  The pelvis was once again irrigated and suctioned. Hemostasis was then good. The abdomen was closed at this time. The moistened lap pads and the Alexis retractor were removed from the peritoneal cavity. The parietal peritoneum was closed with a running suture of 2-0 Vicryl.The fascia was  closed with a running suture of 0 Vicryl. The subcutaneous layer was irrigated  and suctioned and made hemostatic with monopolar cautery.The skin was  closed with a subcuticular suture of 4-0 Vicryl. Dermabond was placed over the incision.  A sterile bandage was later placed.  Attention was turned to the vaginal portion of the procedure at this point. The patient was examined and there was excellent vaginal cuff support. There was no evidence of a cystocele at this time. There was a second degree rectocele noted.  Allis clamps were used to mark a 4 cm area underneath the beginning 1 cm below the urethral meatus and extending down toward the vaginal cuff. The mucosa was injected locally with 1% lidocaine with epinephrine 1:100,000. The mucosa was incised vertically in the midline with a scalpel and a combination of sharp and blunt dissection were used to dissect this vaginal tissue and mucosa off the underlying  bladder. The dissection was carried back to the pubic rami.  A 1 cm suprapubic incisions were then created 2 cm to the right and left in the midline. The Foley catheter was removed. The TVT Exact  was performed in a bottom up fashion. The urethral guide and Foley tip were placed in the urethra and the urethra was deflected properly as the TVT guide was brought through the right retropubic space and up through the right suprapubic incision. The urethra was then deflected in the opposite direction and the TVT guide was placed through the left retropubic space and through the left suprapubic ipsilateral incision.  The obturator guide was removed at this time and cystoscopy was performed and the findings were as noted above.  The bladder was drained of all cystoscopic fluid and the Foley catheter was replaced. The sling was brought up through the suprapubic incisions bilaterally. The plastic sheaths were removed as a Kelly clamp was placed between the urethra and the sling. The sling was noted to be in excellent position. Excess mesh was trimmed suprapubically. The vaginal skin edges were trimmed at this time and the anterior vaginal wall was closed with a running lock suture of 2-0 Vicryl.  The suprapubic incisions were closed at the termination of the procedure with Dermabond.  Attention was turned to the posterior vaginal wall at this time. Allis clamps were used to mark the introitus, the perineal body, and then the posterior vaginal wall mucosa. The perineal body and the posterior vaginal mucosa were injected with 1% lidocaine with epinephrine 1:100,000. A triangular wedge of epithelium was excised from the perineal body and the posterior vaginal mucosa was incised vertically in the midline with the Metzenbaum scissors. Sharp and blunt dissection were used to dissect the perirectal fascia off the vaginal mucosa bilaterally. The posterior colporrhaphy was then performed with vertical mattress sutures of 0 Vicryl which reduced the rectocele. A crown stitch of 0 Vicryl was placed along the perineal body. Rectal exam confirmed the absence of sutures in the  rectum. Excess vaginal mucosa was trimmed and the posterior vaginal mucosa was closed with a running lock suture of 2-0 Vicryl. This was brought down to the hymenal ring and then under the hymen. The transverse perineal muscles were reapproximated with a running suture of this 2-0 Vicryl. The suture was then brought up the perineal body in a subcuticular fashion as for an episiotomy repair. The knot was tied at the hymen.  Vaginal examination confirmed excellent vaginal support and elevation. Final rectal exam documented the absence of sutures in the rectum. A gauze packing with Estrace cream was placed inside the vagina.  At this point, the patient was awakened and extubated, and escorted to the recovery room in stable condition. There were no complications. All needle, instrument, and sponge counts were correct.     Lenard Galloway, M.D.

## 2016-03-28 NOTE — Progress Notes (Signed)
Update to History and Physical  No marked change in status since office preop visit.   Ok to proceed with surgery.   Prefers to keep ovaries if they are normal.

## 2016-03-28 NOTE — Anesthesia Preprocedure Evaluation (Signed)
Anesthesia Evaluation  Patient identified by MRN, date of birth, ID band Patient awake    Reviewed: Allergy & Precautions, NPO status , Patient's Chart, lab work & pertinent test results  Airway Mallampati: I  TM Distance: >3 FB Neck ROM: Full    Dental   Pulmonary    Pulmonary exam normal        Cardiovascular hypertension, Pt. on medications Normal cardiovascular exam     Neuro/Psych Anxiety Depression    GI/Hepatic (+) Hepatitis -  Endo/Other    Renal/GU      Musculoskeletal   Abdominal   Peds  Hematology   Anesthesia Other Findings   Reproductive/Obstetrics                             Anesthesia Physical Anesthesia Plan  ASA: II  Anesthesia Plan: General   Post-op Pain Management:    Induction: Intravenous  Airway Management Planned: Oral ETT  Additional Equipment:   Intra-op Plan:   Post-operative Plan: Extubation in OR  Informed Consent: I have reviewed the patients History and Physical, chart, labs and discussed the procedure including the risks, benefits and alternatives for the proposed anesthesia with the patient or authorized representative who has indicated his/her understanding and acceptance.     Plan Discussed with: CRNA and Surgeon  Anesthesia Plan Comments:         Anesthesia Quick Evaluation

## 2016-03-28 NOTE — Transfer of Care (Signed)
Immediate Anesthesia Transfer of Care Note  Patient: Stephanie Armstrong  Procedure(s) Performed: Procedure(s): ABDOMINO SACROCOLPOPEXY (N/A) ANTERIOR (CYSTOCELE) AND POSTERIOR REPAIR (RECTOCELE) (N/A) TRANSVAGINAL TAPE (TVT) PROCEDURE exact midurethral sling (N/A) CYSTOSCOPY (N/A) BILATERAL SALPINGECTOMY (Bilateral) LYSIS OF ADHESION (N/A)  Patient Location: PACU  Anesthesia Type:General  Level of Consciousness: awake, alert  and oriented  Airway & Oxygen Therapy: Patient Spontanous Breathing and Patient connected to nasal cannula oxygen  Post-op Assessment: Report given to RN and Post -op Vital signs reviewed and stable  Post vital signs: Reviewed and stable  Last Vitals:  Vitals:   03/28/16 0626  BP: 105/65  Pulse: (!) 57  Resp: 16  Temp: 36.4 C    Last Pain:  Vitals:   03/28/16 0626  TempSrc: Oral      Patients Stated Pain Goal: 3 ([id number])  Complications: No apparent anesthesia complications

## 2016-03-28 NOTE — Anesthesia Procedure Notes (Signed)
Procedure Name: Intubation Date/Time: 03/28/2016 7:37 AM Performed by: Jream Broyles, Sheron Nightingale Pre-anesthesia Checklist: Patient identified, Emergency Drugs available, Suction available, Patient being monitored and Timeout performed Patient Re-evaluated:Patient Re-evaluated prior to inductionOxygen Delivery Method: Circle system utilized Preoxygenation: Pre-oxygenation with 100% oxygen Intubation Type: IV induction Ventilation: Mask ventilation without difficulty Laryngoscope Size: Mac and 4 Grade View: Grade II Tube type: Oral Number of attempts: 2 Placement Confirmation: ETT inserted through vocal cords under direct vision,  positive ETCO2 and breath sounds checked- equal and bilateral Secured at: 21 cm Dental Injury: Teeth and Oropharynx as per pre-operative assessment

## 2016-03-28 NOTE — Progress Notes (Signed)
Pt complained of mild chest pressure which radiated to the front of her neck with deep inspiration. No nausea, sweating, radiation to arm or neck. Never had it before. VSS. Pt awake. No EKG changes on monitor.  Obtained 12 lead EKG. Only change was prolongation of QT interval probably due to medicines administered during GA.  Low probability of this being of cardiac origin. Pt now feels hungry!  Plan: Discharge to the floor.  Lillia Abed MD

## 2016-03-28 NOTE — Anesthesia Postprocedure Evaluation (Signed)
Anesthesia Post Note  Patient: CARELY Armstrong  Procedure(s) Performed: Procedure(s) (LRB): ABDOMINO SACROCOLPOPEXY (N/A) ANTERIOR (CYSTOCELE) AND POSTERIOR REPAIR (RECTOCELE) (N/A) TRANSVAGINAL TAPE (TVT) PROCEDURE exact midurethral sling (N/A) CYSTOSCOPY (N/A) BILATERAL SALPINGECTOMY (Bilateral) LYSIS OF ADHESION (N/A)  Patient location during evaluation: PACU Anesthesia Type: General Level of consciousness: awake and alert Pain management: pain level controlled Vital Signs Assessment: post-procedure vital signs reviewed and stable Respiratory status: spontaneous breathing, nonlabored ventilation, respiratory function stable and patient connected to nasal cannula oxygen Cardiovascular status: blood pressure returned to baseline and stable Postop Assessment: no signs of nausea or vomiting Anesthetic complications: no     Last Vitals:  Vitals:   03/28/16 1245 03/28/16 1315  BP: (!) 96/54 96/63  Pulse: 71 77  Resp: 12 12  Temp:      Last Pain:  Vitals:   03/28/16 1315  TempSrc:   PainSc: 0-No pain   Pain Goal: Patients Stated Pain Goal: 3 (03/28/16 1315)               Jerren Flinchbaugh DAVID

## 2016-03-28 NOTE — Brief Op Note (Addendum)
03/28/2016  3:59 PM  PATIENT:  Stephanie Armstrong  60 y.o. female  PRE-OPERATIVE DIAGNOSIS:  cystocele, rectocele, vaginal vault prolapse after hysterectomy  POST-OPERATIVE DIAGNOSIS:  cystocele, rectocele, vaginal vault prolapse after hysterectomy  PROCEDURE:  ABDOMINAL SACROCOLPOPEXY, LYSIS OF ADHESIONS, TVT EXACT MIDURETHRAL SLING, CYSTOSCOPY, POSTERIOR COLPORRHAPHY.   SURGEON:  Surgeon(s) and Role:    * Sutter Ahlgren E Yisroel Ramming, MD - Primary    * Salvadore Dom, MD - Assisting  PHYSICIAN ASSISTANT: NA  ASSISTANTS: Dorothy Spark, MD   ANESTHESIA:   local and general  EBL:  Total I/O In: 2400 [I.V.:2400] Out: 525 [Urine:375; Blood:150]  BLOOD ADMINISTERED:none  DRAINS: Urinary Catheter (Foley)   LOCAL MEDICATIONS USED:  LIDOCAINE   SPECIMEN:  Source of Specimen:  bilateral tubes  DISPOSITION OF SPECIMEN:  PATHOLOGY  COUNTS:  YES  TOURNIQUET:  * No tourniquets in log *  DICTATION: .Note written in EPIC  PLAN OF CARE: Admit to inpatient   PATIENT DISPOSITION:  PACU - hemodynamically stable.   Delay start of Pharmacological VTE agent (>24hrs) due to surgical blood loss or risk of bleeding: not applicable

## 2016-03-29 LAB — CBC
HCT: 32.8 % — ABNORMAL LOW (ref 36.0–46.0)
Hemoglobin: 11.3 g/dL — ABNORMAL LOW (ref 12.0–15.0)
MCH: 32.2 pg (ref 26.0–34.0)
MCHC: 34.5 g/dL (ref 30.0–36.0)
MCV: 93.4 fL (ref 78.0–100.0)
Platelets: 175 10*3/uL (ref 150–400)
RBC: 3.51 MIL/uL — ABNORMAL LOW (ref 3.87–5.11)
RDW: 13.3 % (ref 11.5–15.5)
WBC: 7.8 10*3/uL (ref 4.0–10.5)

## 2016-03-29 LAB — BASIC METABOLIC PANEL
Anion gap: 5 (ref 5–15)
BUN: 14 mg/dL (ref 6–20)
CO2: 27 mmol/L (ref 22–32)
Calcium: 8.1 mg/dL — ABNORMAL LOW (ref 8.9–10.3)
Chloride: 107 mmol/L (ref 101–111)
Creatinine, Ser: 0.72 mg/dL (ref 0.44–1.00)
GFR calc Af Amer: >60 mL/min (ref >60)
GFR calc non Af Amer: >60 mL/min (ref >60)
Glucose, Bld: 119 mg/dL — ABNORMAL HIGH (ref 65–99)
Potassium: 3.7 mmol/L (ref 3.5–5.1)
Sodium: 139 mmol/L (ref 135–145)

## 2016-03-29 NOTE — Progress Notes (Signed)
1 Day Post-Op Procedure(s) (LRB): ABDOMINO SACROCOLPOPEXY (N/A) ANTERIOR (CYSTOCELE) AND POSTERIOR REPAIR (RECTOCELE) (N/A) TRANSVAGINAL TAPE (TVT) PROCEDURE exact midurethral sling (N/A) CYSTOSCOPY (N/A) BILATERAL SALPINGECTOMY (Bilateral) LYSIS OF ADHESION (N/A)  Subjective: Patient reports tolerating PO.   Eating regular diet.  Good pain control.  Ambulated once.   Objective: I have reviewed patient's vital signs, intake and output and labs. Vitals:   03/29/16 0350 03/29/16 0559  BP: 102/64   Pulse: 63   Resp: 14 16  Temp: 98 F (36.7 C)    5100 cc/2025 cc  Hgb 11.3  General: alert and cooperative Resp: clear to auscultation bilaterally Cardio: regular rate and rhythm, S1, S2 normal, no murmur, click, rub or gallop GI: soft, non-tender; bowel sounds normal; no masses,  no organomegaly and incision: clean, dry and intact Extremities: Ted hose on.  Vaginal Bleeding: minimal  Vaginal packing - removed by me. Moderate bleeding.   Assessment: s/p Procedure(s): ABDOMINO SACROCOLPOPEXY (N/A) ANTERIOR (CYSTOCELE) AND POSTERIOR REPAIR (RECTOCELE) (N/A) TRANSVAGINAL TAPE (TVT) PROCEDURE exact midurethral sling (N/A) CYSTOSCOPY (N/A) BILATERAL SALPINGECTOMY (Bilateral) LYSIS OF ADHESION (N/A): progressing well  Plan: Encourage ambulation Advance to PO medication Discontinue IV fluids Start bladder training tomorrow.   LOS: 1 day    Stephanie Armstrong 03/29/2016, 8:08 AM

## 2016-03-29 NOTE — Addendum Note (Signed)
Addendum  created 03/29/16 1025 by Raenette Rover, CRNA   Sign clinical note

## 2016-03-29 NOTE — Progress Notes (Signed)
Pt c/o of numbness and tingling in right leg since surgery, good pedal pulse TEDS and SCD on.   Pt c/o numbness and tingling in left hand since surgery, able to move fingers easily.

## 2016-03-29 NOTE — Anesthesia Postprocedure Evaluation (Signed)
Anesthesia Post Note  Patient: Stephanie Armstrong  Procedure(s) Performed: Procedure(s) (LRB): ABDOMINO SACROCOLPOPEXY (N/A) ANTERIOR (CYSTOCELE) AND POSTERIOR REPAIR (RECTOCELE) (N/A) TRANSVAGINAL TAPE (TVT) PROCEDURE exact midurethral sling (N/A) CYSTOSCOPY (N/A) BILATERAL SALPINGECTOMY (Bilateral) LYSIS OF ADHESION (N/A)  Patient location during evaluation: [facility] Anesthesia Type: General Level of consciousness: awake, awake and alert, oriented and patient cooperative Pain management: pain level controlled Vital Signs Assessment: post-procedure vital signs reviewed and stable Respiratory status: spontaneous breathing, nonlabored ventilation and respiratory function stable Cardiovascular status: stable Postop Assessment: no signs of nausea or vomiting Anesthetic complications: no     Last Vitals:  Vitals:   03/29/16 0559 03/29/16 0800  BP:  (!) 102/53  Pulse:  66  Resp: 16 16  Temp:  37.1 C    Last Pain:  Vitals:   03/29/16 0800  TempSrc: Oral  PainSc:    Pain Goal: Patients Stated Pain Goal: 3 (03/29/16 0730)               [name]

## 2016-03-30 MED ORDER — IBUPROFEN 600 MG PO TABS: 600.0000 mg | ORAL_TABLET | Freq: Four times a day (QID) | ORAL | 0 refills | Status: DC | PRN

## 2016-03-30 MED ORDER — OXYCODONE-ACETAMINOPHEN 5-325 MG PO TABS: 1.0000 | ORAL_TABLET | ORAL | 0 refills | Status: DC | PRN

## 2016-03-30 NOTE — Progress Notes (Signed)
Patient given discharge instructions and reviewed with patient, pt verbalizes an understanding of bladder and incisional care. Follow up to be scheduled by patient for Monday.

## 2016-03-30 NOTE — Discharge Instructions (Signed)
Sacrocolpopexy, Care After Refer to this sheet in the next few weeks. These instructions provide you with information on caring for yourself after your procedure. Your health care provider may also give you more specific instructions. Your treatment has been planned according to current medical practices, but problems sometimes occur. Call your health care provider if you have any problems or questions after your procedure.  WHAT TO EXPECT AFTER THE PROCEDURE  After your procedure, it is typical to have the following:  Pain.  Some vaginal bleeding.  Fatigue. HOME CARE INSTRUCTIONS  Medicine   Only take medicines as directed by your health care provider.  Make sure to finish antibiotic medicine even if you start to feel better. Bandages   Care for your bandages and cuts (incisions) as directed by your surgeon.  Do not shower until after your bandages have been removed. Activity   Take at least two 10-minute walks each day.  Ask your health care provider when you can return to work and do all your usual activities. It may take 3 months to recover completely.  You will have to restrict many activities when you first return home. For at least 6 weeks:  Do not lift anything heavier than a gallon of milk.  Do not sit on a bike seat.  Do not use a tampon or put anything inside your vagina.  Do not have sexual intercourse.  Do not participate instrenuous activities like running or aerobics. Other Instructions   Do not take a bath or go swimming until your health care provider says you can. Most people can shower after about 6 weeks after the procedure.  Drink enough fluids to keep your urine clear or pale yellow. This helps prevent constipation.  See your health care provider to have your stitches or staples removed if necessary.  Keep all follow-up appointments. SEEK MEDICAL CARE IF:   You have chills or fever.  Your pain medicine is not helping.  You have vaginal  bleeding or discharge that will not go away. SEEK IMMEDIATE MEDICAL CARE IF:   You have a fever for more than 2-3 days.  You have very bad pain.  You have heavy vaginal bleeding or discharge.  You have any signs of infection around your cut (incision). Watch for:  Redness.  Tenderness.  Warmth.  Pus or other discharge.  You develop a warm, tender area in your leg.  You have chest pain or trouble breathing. This information is not intended to replace advice given to you by your health care provider. Make sure you discuss any questions you have with your health care provider. Document Released: 04/08/2013 Document Reviewed: 04/08/2013 Elsevier Interactive Patient Education  2017 [employer]  Anterior and Posterior Colporrhaphy, Sling Procedure, Care After Refer to this sheet in the next few weeks. These instructions provide you with information on caring for yourself after your procedure. Your health care provider may also give you more specific instructions. Your treatment has been planned according to current medical practices, but problems sometimes occur. Call your health care provider if you have any problems or questions after your procedure.  HOME CARE INSTRUCTIONS  Rest as much as possible during the first 2 weeks after the procedure.   Avoid heavy lifting (more than 10 pounds [4.5 kg]), pushing, or pulling. Limit stair climbing to once or twice a day the first week, then slowly increase this activity.   Avoid standing for prolonged periods of time.   Talk with your health care provider  about when you may resume your usual physical activity.   You may resume your normal diet right away.   Drink at least 6-8 glasses of non-caffeinated beverages per day.   Eat a well-balanced diet. Daily portions of food from the meat (protein), milk, fruit, vegetable, and bread families are necessary for your health.   Your normal bowel function should return. If you become  constipated, you may:   Take a mild laxative.  Add fruit and bran to your diet.  Drink more liquids.  You may take a shower and wash your hair.   Only take over-the-counter or prescription medicines as directed by your health care provider.   Clean the incision with water. Do not use a dressing unless the incision is draining or irritated. Check your incision daily for redness, draining, swelling, or separation of the skin.   Follow any bladder care instructions provided by your health care provider.   Keep your perineal area (the area between vagina and rectum) clean and dry. Perform perineal care after every bowel movement and each time you urinate. You may take a sitz bath or sit in a tub of clean, warm water when necessary, unless your health care provider tells you otherwise.   Do not have sexual intercourse until permitted by your health care provider.   Follow up with your health care provider as directed.  SEEK MEDICAL CARE IF:  You have shaking chills.   Your pain is not relieved with medicine or becomes worse.  You have frequent or urgent urination, or you are unable to completely empty your bladder.   You feel a burning sensation when urinating.   You see pus coming from the wounds.  SEEK IMMEDIATE MEDICAL CARE IF:  You develop a fever.  You notice redness, drainage, swelling, or separation of the skin at the incision site.  You have difficulty breathing.  You are unable to urinate. MAKE SURE YOU:   Understand these instructions.  Will watch your condition.  Will get help right away if you are not doing well or get worse. This information is not intended to replace advice given to you by your health care provider. Make sure you discuss any questions you have with your health care provider. Document Released: 11/16/2003 Document Revised: 12/04/2012 Document Reviewed: 08/23/2012 Elsevier Interactive Patient Education  2017 [employer].

## 2016-03-30 NOTE — Progress Notes (Signed)
MD informed of voiding trials and bladder scan volumes by Vonzella Nipple RN. Per MD, patient may be discharged to home as planned.

## 2016-03-30 NOTE — Progress Notes (Signed)
2 Days Post-Op Procedure(s) (LRB): ABDOMINO SACROCOLPOPEXY (N/A) ANTERIOR (CYSTOCELE) AND POSTERIOR REPAIR (RECTOCELE) (N/A) TRANSVAGINAL TAPE (TVT) PROCEDURE exact midurethral sling (N/A) CYSTOSCOPY (N/A) BILATERAL SALPINGECTOMY (Bilateral) LYSIS OF ADHESION (N/A)  Patient seen at 8:00 am.  Subjective: Patient reports tolerating PO.   Foley out first thing this morning.  No void as of visit time.  Ambulating.  Good pain control with po medication.  Notes some epigastric discomfort.   Objective: I have reviewed patient's vital signs and intake and output. Vitals:   03/30/16 0425 03/30/16 0738  BP: 136/69 137/83  Pulse: 77 60  Resp: 18 17  Temp: 98.1 F (36.7 C) 97.8 F (36.6 C)   I/O - 1160 cc/1450 cc.  General: alert and cooperative Resp: clear to auscultation bilaterally Cardio: regular rate and rhythm, S1, S2 normal, no murmur, click, rub or gallop GI: soft, non-tender; bowel sounds normal; no masses,  no organomegaly and incision: clean, dry, intact and right suprapubic incision - Dermabond came off and steristrips and gauze with tape placed. Vaginal Bleeding: minimal  Assessment: s/p Procedure(s): ABDOMINO SACROCOLPOPEXY (N/A) ANTERIOR (CYSTOCELE) AND POSTERIOR REPAIR (RECTOCELE) (N/A) TRANSVAGINAL TAPE (TVT) PROCEDURE exact midurethral sling (N/A) CYSTOSCOPY (N/A) BILATERAL SALPINGECTOMY (Bilateral) LYSIS OF ADHESION (N/A): progressing well  Plan: Discharge home Will complete voiding trials prior to discharge.   Instructions and precautions given.  Rx for Percocet and Motrin.  Eat food before taking pain medication.  Take Miralax daily until BMs begin.  Then can take stool softener such as Colace. Instructions and precautions given.  Follow up in 4 days.    LOS: 2 days    Arloa Koh 03/30/2016, 10:58 AM

## 2016-03-30 NOTE — Progress Notes (Signed)
Pt discharged in good condition, declining offer of wheelchair, pt prefering to ambulate with her son who is here to take her home.

## 2016-03-31 ENCOUNTER — Telehealth: Payer: Self-pay | Admitting: Obstetrics and Gynecology

## 2016-03-31 ENCOUNTER — Encounter (HOSPITAL_COMMUNITY): Payer: Self-pay | Admitting: Obstetrics and Gynecology

## 2016-03-31 NOTE — Telephone Encounter (Signed)
I recommend taking pain medication.  I think the discomfort is due to her incision on the inside of the abdominal wall.  Numbness around the incision is also common.  Keep appointment for visit on Monday.  Please make sure she is voiding well.

## 2016-03-31 NOTE — Telephone Encounter (Signed)
Spoke with patient. Patient states she has an area between belly button and incision that started burning today around 11am. Patient states area is not red, swollen or painful, does not feel warm to touch. Patient states area almost feels numb. Patient states it feels like it is hot internally. Patient denies any drainage from incision or odor. Patient states she still has a lot of betadine on skin and thinks maybe this is related as she has tried to wash it off. Advised patient to not scrub skin, will take several washes for betadine to come off. Patient states she used aloe gel to area and cortisone cream. Advised patient not to use any creams or lotions on incision or around, keep area clean and dry. Patient denies any other complaints. Advised patient to continue to monitor. Will review with Dr. Quincy Simmonds and return call with recommendations. Patient is agreeable.  Dr. Quincy Simmonds, please advise?  03/28/16 ABDOMINO SACROCOLPOPEXY (N/A Abdomen)  ANTERIOR (CYSTOCELE) AND POSTERIOR REPAIR (RECTOCELE) (N/A Vagina )  TRANSVAGINAL TAPE (TVT) PROCEDURE exact midurethral sling (N/A Vagina )  CYSTOSCOPY (N/A Urethra)  BILATERAL SALPINGECTOMY (Bilateral Abdomen)  LYSIS OF ADHESION (N/A Abdomen) [id number] CPT (R)]

## 2016-03-31 NOTE — Telephone Encounter (Signed)
Patient says she had surgery Tuesday and says that today she is having a "sunburn" feeling where the tap/foam piece was over her incision.  States skin is not red or swollen.

## 2016-03-31 NOTE — Telephone Encounter (Signed)
Spoke with patient. Advised as seen below per Dr. Quincy Simmonds. Patient states she is voiding frequently, smaller amounts. Patient states she does not feel any abdominal discomfort and denies abdomen being distended. Patient is scheduled for f/u 04/05/16 at 2:30pm. Patient verbalizes understanding and is agreeable.  Routing to provider for final review. Patient is agreeable to disposition. Will close encounter.

## 2016-04-02 NOTE — Discharge Summary (Signed)
Physician Discharge Summary  Patient ID: Stephanie Armstrong MRN: [id number] DOB/AGE: 1955-08-16 60 y.o.  Admit date: 03/28/2016 Discharge date:  03/30/16 Admission Diagnoses: 1.  Cystocele. 2.  Rectocele.  3.  Vaginal vault prolapse following hysterectomy.  Discharge Diagnoses:  1.  Cystocele. 2.  Rectocele.  3.  Vaginal vault prolapse following hysterectomy. 4.  Pelvic adhesions. 4.  Status post Abdominal sacrocolpopexy, lysis of adhesions, bilateral salpingectomy, TVT Exact midurethral sling and cystoscopy, posterior colporrhaphy.  Active Problems:   Status post surgery   Discharged Condition: good  Hospital Course:  The patient was admitted on 03/28/16 for an Abdominal sacrocolpopexy, lysis of adhesions, bilateral salpingectomy, TVT Exact midurethral sling and cystoscopy, posterior colporrhaphy, which were performed without complication while under general anesthesia.  The patient's post op course was uneventful.  She had a morphine PCA and Toradol for pain control initially, and this was converted over to Percocet and Motrin on post op day one when the patient began taking po well.  She ambulated independently and wore PAS and Ted hose for DVT prophylaxis while in bed.  Her foley catheter were removed on post op day two, and she voided good volumes with residuals under 150 cc. The patient's vital signs remained stable and she demonstrated no signs of infection during her hospitalization.  The patient's post op day one Hgb was 11.3.  She was tolerating the this well.  She had very minimal vaginal bleeding, and her incision(s) demonstrated no signs of erythema or significant drainage.  She was found to be in good condition and ready for discharge on post op day two.  Consults: None  Significant Diagnostic Studies: labs:  See Hospital Course.  Treatments: surgery:  Abdominal sacrocolpopexy, lysis of adhesions, bilateral salpingectomy, TVT Exact midurethral sling and cystoscopy,  posterior colporrhaphy performed on 03/28/16.  Discharge Exam: Blood pressure 131/75, pulse 66, temperature 98.7 F (37.1 C), temperature source Oral, resp. rate 18, height 5\' 6"  (1.676 m), weight 147 lb (66.7 kg), SpO2 97 %. General: alert and cooperative Resp: clear to auscultation bilaterally Cardio: regular rate and rhythm, S1, S2 normal, no murmur, click, rub or gallop GI: soft, non-tender; bowel sounds normal; no masses,  no organomegaly and incision: clean, dry, intact and right suprapubic incision - Dermabond came off and steristrips and gauze with tape placed. Vaginal Bleeding: minimal  Disposition: 01-Home or Self Care  Instructions reviewed in verbal and written form.  Allergies as of 03/30/2016   No Known Allergies     Medication List    STOP taking these medications   conjugated estrogens vaginal cream Commonly known as:  PREMARIN     TAKE these medications   ALPRAZolam 1 MG tablet Commonly known as:  XANAX TAKE 1 TO 1 &1/2 TABLETS AT BEDTIME AS NEEDED . MAY FILL MONTHLY. What changed:  how much to take  how to take this  when to take this  reasons to take this  additional instructions   amLODipine-benazepril 5-40 MG capsule Commonly known as:  LOTREL TAKE (1) CAPSULE BY MOUTH ONCE DAILY.   Biotin 1000 MCG tablet Take 1,000 mcg by mouth daily.   CITRUCEL oral powder Generic drug:  methylcellulose Take 1 packet by mouth daily.   hydrochlorothiazide 25 MG tablet Commonly known as:  HYDRODIURIL TAKE ONE TABLET BY MOUTH ONCE DAILY AS NEEDED FOR SWELLING. DO NOT EXCEED 3 PER WEEK.   ibuprofen 600 MG tablet Commonly known as:  ADVIL,MOTRIN Take 1 tablet (600 mg total) by mouth every 6 (six)  hours as needed (mild pain). What changed:  reasons to take this   Magnesium 200 MG Tabs Take 1 tablet by mouth daily.   oxyCODONE-acetaminophen 5-325 MG tablet Commonly known as:  PERCOCET/ROXICET Take 1-2 tablets by mouth every 4 (four) hours as needed  for severe pain (moderate to severe pain (when tolerating fluids)).   senna-docusate 8.6-50 MG tablet Commonly known as:  Senokot-S Take 1 tablet by mouth 2 (two) times daily. While taking pain meds to prevent constipation. What changed:  when to take this  reasons to take this  additional instructions      Follow-up Clayton, MD In 4 days.   Specialty:  Obstetrics and Gynecology Contact information: [address] [place] [address] [phone]           Signed: Arloa Koh 04/02/2016, 8:43 PM

## 2016-04-03 ENCOUNTER — Telehealth: Payer: Self-pay | Admitting: Obstetrics and Gynecology

## 2016-04-03 NOTE — Telephone Encounter (Signed)
Patient had a abdomino sacrocolpopexy (N/A Abdomen) anterior (cystocele) and posterior repair (rectocele) (N/A vagina) transvaginal tape (TVT) procedure exact midurethral sling (N/A vagina) cystoscopy (N/A urethra) bilateral salpingectomy (bilateral abdomen) lysis of adhesion (N/A abdomen) on 03/28/2016. Spoke with patient. Patient had one bowel movement on 03/30/2016. Reports taking Miralax and Senokot daily. Drinking plenty of water, moving around often, eating green leafy vegetables. Asking if she may use an enema or glycerin suppository. Advised I will review with Dr.Silva and return call with further recommendations.

## 2016-04-03 NOTE — Telephone Encounter (Signed)
It is better to take something orally.  I would recommend a small amount of magnesium citrate, like 1/5 of the bottle.  Do not place anything rectally.

## 2016-04-03 NOTE — Telephone Encounter (Signed)
Patient called and said, "I had surgery last week but since then I've only had one bowel movement. I'd like to know if I can use an enema."

## 2016-04-03 NOTE — Telephone Encounter (Signed)
Spoke with patient. Advised of message as seen below from [place]. Patient is agreeable and verbalizes understanding. Reports since RN spoke with patient this morning she has had a BM. If she has further problems with constipation will drink 1/5 bottle of magnesium citrate. Aware not to place anything rectally.  Routing to provider for final review. Patient agreeable to disposition. Will close encounter.

## 2016-04-05 ENCOUNTER — Encounter: Payer: Self-pay | Admitting: Obstetrics and Gynecology

## 2016-04-05 ENCOUNTER — Ambulatory Visit (INDEPENDENT_AMBULATORY_CARE_PROVIDER_SITE_OTHER): Payer: BLUE CROSS/BLUE SHIELD | Admitting: Obstetrics and Gynecology

## 2016-04-05 VITALS — BP 116/62 | HR 80 | Temp 97.3°F | Resp 14 | Ht 66.0 in | Wt 148.0 lb

## 2016-04-05 DIAGNOSIS — N398 Other specified disorders of urinary system: Secondary | ICD-10-CM

## 2016-04-05 DIAGNOSIS — Z9889 Other specified postprocedural states: Secondary | ICD-10-CM | POA: Diagnosis not present

## 2016-04-05 NOTE — Progress Notes (Signed)
GYNECOLOGY  VISIT   HPI: 60 y.o.   Widowed  Caucasian  female   G3P3 with No LMP recorded. Patient has had a hysterectomy.   here for   1 week post op ABDOMINO SACROCOLPOPEXY (N/A Abdomen); ANTERIOR (CYSTOCELE) AND POSTERIOR REPAIR (RECTOCELE) (N/A Vagina ); TRANSVAGINAL TAPE (TVT) PROCEDURE exact midurethral sling (N/A Vagina ); CYSTOSCOPY (N/A Urethra); BILATERAL SALPINGECTOMY (Bilateral Abdomen); LYSIS OF ADHESION (N/A Abdomen  Voiding well but feels like it is less volume. Worried she is not voiding well.  Voided in the office about 1/3 cup.  No pain with urination.   Had a BM.   Good pain control  Tapering off the Percocet.  Left side is burning more than right.   Vaginal bleeding is light.   Doing light ambulation.   GYNECOLOGIC HISTORY: No LMP recorded. Patient has had a hysterectomy. Contraception:  Hysterectomy  Menopausal hormone therapy:  none Last mammogram:  10/08/15 BIRADS 1 negative Last pap smear:   10/11/15 ASCUS. HR HPV:+Detected.  Colposcopy on 10/11/15 - no dysplasia        OB History    Gravida Para Term Preterm AB Living   3 3       3    SAB TAB Ectopic Multiple Live Births                     Patient Active Problem List   Diagnosis Date Noted  . Status post surgery 03/28/2016  . Carpal tunnel syndrome 07/02/2014  . Depression 01/12/2013  . Essential hypertension, benign 01/12/2013  . 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 Easy bruisability 06/26/2012  . Menopause 06/26/2012  . Insomnia 06/26/2012    Past Medical History:  Diagnosis Date  . Anxiety   . Arthritis    fingers  . 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 Hepatitis    age 46 , unknown type  . History of vulvar dysplasia    VIN 2  . Hypertension   . IBS (irritable bowel syndrome)   . Rectocele   . Urethral pain    mass  . Urinary incontinence   . 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   . Wears contact lenses     Past Surgical History:  Procedure Laterality Date  . ABDOMINAL SACROCOLPOPEXY N/A 03/28/2016   Procedure: ABDOMINO SACROCOLPOPEXY;  Surgeon: Nunzio Cobbs, MD;  Location: [facility];  Service: Gynecology;  Laterality: N/A;  . ANTERIOR AND POSTERIOR REPAIR N/A 03/28/2016   Procedure: ANTERIOR (CYSTOCELE) AND POSTERIOR REPAIR (RECTOCELE);  Surgeon: Nunzio Cobbs, MD;  Location: [facility];  Service: Gynecology;  Laterality: N/A;  . AUGMENTATION MAMMAPLASTY  APRIL 2013   BREAST IMPLANT CHANGED  . BILATERAL SALPINGECTOMY Bilateral 03/28/2016   Procedure: BILATERAL SALPINGECTOMY;  Surgeon: Nunzio Cobbs, MD;  Location: [facility];  Service: Gynecology;  Laterality: Bilateral;  . BLADDER SUSPENSION N/A 03/28/2016   Procedure: TRANSVAGINAL TAPE (TVT) PROCEDURE exact midurethral sling;  Surgeon: Nunzio Cobbs, MD;  Location: [facility];  Service: Gynecology;  Laterality: N/A;  . COLONOSCOPY    . CYSTOSCOPY N/A 03/28/2016   Procedure: CYSTOSCOPY;  Surgeon: Nunzio Cobbs, MD;  Location: [facility];  Service: Gynecology;  Laterality: N/A;  . CYSTOSCOPY WITH BIOPSY Bilateral 11/03/2015   Procedure: CYSTOSCOPY WITH  URETHRAL BIOPSY, FULGERATION, BILATERAL RETROGRADE PYELOGRAMS;  Surgeon: Alexis Frock, MD;  Location[facility];  Service: Urology;  Laterality: Bilateral;  . LYSIS OF ADHESION N/A 03/28/2016   Procedure: LYSIS OF ADHESION;  Surgeon: Nunzio Cobbs, MD;  Location: [facility];  Service: Gynecology;  Laterality: N/A;  . MASS EXCISION N/A 11/03/2015   Procedure: EXCISION  URETHRAL MASS;  Surgeon: Alexis Frock, MD;  Location: [facility];  Service: Urology;  Laterality: N/A;  . TUBAL LIGATION    . VAGINAL HYSTERECTOMY  1999    Current Outpatient Prescriptions  Medication Sig Dispense Refill  . ALPRAZolam (XANAX) 1 MG tablet TAKE 1 TO 1  &1/2 TABLETS AT BEDTIME AS NEEDED . MAY FILL MONTHLY. (Patient taking differently: Take 1-1.5 mg by mouth at bedtime as needed for anxiety. TAKE 1 TO 1 &1/2 TABLETS AT BEDTIME AS NEEDED . MAY FILL MONTHLY.) 45 tablet 5  . amLODipine-benazepril (LOTREL) 5-40 MG capsule TAKE (1) CAPSULE BY MOUTH ONCE DAILY. 30 capsule 5  . Biotin 1000 MCG tablet Take 1,000 mcg by mouth daily.    . fluorouracil (EFUDEX) 5 % cream Apply topically daily.  0  . hydrochlorothiazide (HYDRODIURIL) 25 MG tablet TAKE ONE TABLET BY MOUTH ONCE DAILY AS NEEDED FOR SWELLING. DO NOT EXCEED 3 PER WEEK. 30 tablet 5  . ibuprofen (ADVIL,MOTRIN) 600 MG tablet Take 1 tablet (600 mg total) by mouth every 6 (six) hours as needed (mild pain). 30 tablet 0  . methylcellulose (CITRUCEL) oral powder Take 1 packet by mouth daily.     Marland Kitchen oxyCODONE-acetaminophen (PERCOCET/ROXICET) 5-325 MG tablet Take 1-2 tablets by mouth every 4 (four) hours as needed for severe pain (moderate to severe pain (when tolerating fluids)). 30 tablet 0  . senna-docusate (SENOKOT-S) 8.6-50 MG tablet Take 1 tablet by mouth 2 (two) times daily. While taking pain meds to prevent constipation. (Patient taking differently: Take 1 tablet by mouth daily as needed for mild constipation. While taking pain meds to prevent constipation.) 30 tablet 0  . Magnesium 200 MG TABS Take 1 tablet by mouth daily.     No current facility-administered medications for this visit.      ALLERGIES: Patient has no known allergies.  Family History  Problem Relation Age of Onset  . Hypertension Mother   . Cancer Father     MELANOMA  . Diabetes Father   . Hypertension Father   . Heart disease Father   . Cancer Sister     THYROID  . Breast cancer Paternal Grandmother   . Colon cancer Neg Hx     Social History   Social History  . Marital status: Widowed    Spouse name: N/A  . Number of children: N/A  . Years of education: N/A   Occupational History  . Not on file.   Social  History Main Topics  . Smoking status: Never Smoker  . Smokeless tobacco: Never Used  . Alcohol use 6.6 - 7.8 oz/week    7 Glasses of wine, 4 - 6 Standard drinks or equivalent per week  . Drug use: No  . Sexual activity: No     Comment: Tubal/TVH--ovaries remain   Other Topics Concern  . Not on file   Social History Narrative  . No narrative on file    ROS:  Pertinent items are noted in HPI.  PHYSICAL EXAMINATION:    BP 116/62 (BP Location: Right Arm, Patient Position: Sitting, Cuff Size: Normal)   Pulse 80  Temp 97.3 F (36.3 C) (Oral)   Resp 14   Ht 5\' 6"  (1.676 m)   Wt 148 lb (67.1 kg)   BMI 23.89 kg/m     General appearance: alert, cooperative and appears stated age   Abdomen: incision intact and without hernia, soft, non-tender, no masses,  no organomegaly   Pelvic:  SP incisions intact with ecchymoses. No induration.                 Bimanual Exam:  Uterus:   Absent.   No vaginal agglutination.  Vaginal cuff mesh and midurethral sling are protected.              Adnexa: no mass, fullness, tenderness              Urethral catheterization -  Verbal consent for procedure.  Sterile prep with betadine.  Sterile cath for about 25 cc clear, yellow urine.   Chaperone was present for exam.  ASSESSMENT  Doing well post op.  Voiding adequately.   PLAN  Continue decreased activity - no lifing over 10 pounds for 3 months post op. Follow up for 6 week visit.    An After Visit Summary was printed and given to the patient.

## 2016-04-06 ENCOUNTER — Encounter: Payer: Self-pay | Admitting: Obstetrics and Gynecology

## 2016-04-17 DIAGNOSIS — M8580 Other specified disorders of bone density and structure, unspecified site: Secondary | ICD-10-CM

## 2016-04-17 HISTORY — DX: Other specified disorders of bone density and structure, unspecified site: M85.80

## 2016-05-01 ENCOUNTER — Telehealth: Payer: Self-pay | Admitting: Obstetrics and Gynecology

## 2016-05-01 ENCOUNTER — Encounter: Payer: Self-pay | Admitting: Obstetrics and Gynecology

## 2016-05-01 ENCOUNTER — Ambulatory Visit (INDEPENDENT_AMBULATORY_CARE_PROVIDER_SITE_OTHER): Payer: BLUE CROSS/BLUE SHIELD | Admitting: Obstetrics and Gynecology

## 2016-05-01 VITALS — BP 142/70 | HR 60 | Ht 66.0 in | Wt 147.4 lb

## 2016-05-01 DIAGNOSIS — R8299 Other abnormal findings in urine: Secondary | ICD-10-CM | POA: Diagnosis not present

## 2016-05-01 DIAGNOSIS — R350 Frequency of micturition: Secondary | ICD-10-CM

## 2016-05-01 DIAGNOSIS — R82998 Other abnormal findings in urine: Secondary | ICD-10-CM

## 2016-05-01 LAB — POCT URINALYSIS DIPSTICK
Bilirubin, UA: NEGATIVE
Glucose, UA: NEGATIVE
Ketones, UA: NEGATIVE
Nitrite, UA: NEGATIVE
Protein, UA: NEGATIVE
Urobilinogen, UA: NEGATIVE
pH, UA: 5

## 2016-05-01 MED ORDER — CIPROFLOXACIN HCL 500 MG PO TABS: 500.0000 mg | ORAL_TABLET | Freq: Two times a day (BID) | ORAL | 0 refills | Status: DC

## 2016-05-01 NOTE — Progress Notes (Signed)
GYNECOLOGY  VISIT   HPI: 61 y.o.   Widowed  Caucasian  female   G3P3 with No LMP recorded. Patient has had a hysterectomy.   here for urinary urgency/frequency/pressure for 1.5 weeks. Patient states she is getting up 5-6 times a night to urinate.  Lots of urgency and frequency especially toward the end of the day.  Sometimes has dysuria.  No fevers, nausea or vomiting.   Feels aching in her buttocks on her "cheeks."  No vaginal bleeding.   Has 6 week appointment next week.   Urine Dip: 2+WBCs, Trace RBCs  GYNECOLOGIC HISTORY: No LMP recorded. Patient has had a hysterectomy. Contraception:  Hysterectomy Menopausal hormone therapy:  none Last mammogram:   10/08/15 Bil.Implants/Density C/BIRADS 1 negative:TBC  Last pap smear:  10/11/15 ASCUS:Pos HR HPV Colposcopy on 10/11/15 - no dysplasia         OB History    Gravida Para Term Preterm AB Living   3 3       3    SAB TAB Ectopic Multiple Live Births                     Patient Active Problem List   Diagnosis Date Noted  . Status post surgery 03/28/2016  . Carpal tunnel syndrome 07/02/2014  . Depression 01/12/2013  . Essential hypertension, benign 01/12/2013  . 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 Easy bruisability 06/26/2012  . Menopause 06/26/2012  . Insomnia 06/26/2012    Past Medical History:  Diagnosis Date  . Anxiety   . Arthritis    fingers  . 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 Hepatitis    age 41 , unknown type  . History of vulvar dysplasia    VIN 2  . Hypertension   . IBS (irritable bowel syndrome)   . Rectocele   . Urethral pain    mass  . Urinary incontinence   . 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   . Wears contact lenses     Past Surgical History:  Procedure Laterality Date  . ABDOMINAL SACROCOLPOPEXY N/A 03/28/2016   Procedure: ABDOMINO  SACROCOLPOPEXY;  Surgeon: Nunzio Cobbs, MD;  Location: [facility];  Service: Gynecology;  Laterality: N/A;  . ANTERIOR AND POSTERIOR REPAIR N/A 03/28/2016   Procedure: ANTERIOR (CYSTOCELE) AND POSTERIOR REPAIR (RECTOCELE);  Surgeon: Nunzio Cobbs, MD;  Location: [facility];  Service: Gynecology;  Laterality: N/A;  . AUGMENTATION MAMMAPLASTY  APRIL 2013   BREAST IMPLANT CHANGED  . BILATERAL SALPINGECTOMY Bilateral 03/28/2016   Procedure: BILATERAL SALPINGECTOMY;  Surgeon: Nunzio Cobbs, MD;  Location: [facility];  Service: Gynecology;  Laterality: Bilateral;  . BLADDER SUSPENSION N/A 03/28/2016   Procedure: TRANSVAGINAL TAPE (TVT) PROCEDURE exact midurethral sling;  Surgeon: Nunzio Cobbs, MD;  Location: [facility];  Service: Gynecology;  Laterality: N/A;  . COLONOSCOPY    . CYSTOSCOPY N/A 03/28/2016   Procedure: CYSTOSCOPY;  Surgeon: Nunzio Cobbs, MD;  Location: [facility];  Service: Gynecology;  Laterality: N/A;  . CYSTOSCOPY WITH BIOPSY Bilateral 11/03/2015   Procedure: CYSTOSCOPY WITH URETHRAL BIOPSY, FULGERATION, BILATERAL RETROGRADE PYELOGRAMS;  Surgeon: Alexis Frock, MD;  Location: [facility];  Service: Urology;  Laterality: Bilateral;  . LYSIS OF ADHESION N/A 03/28/2016   Procedure: LYSIS OF  ADHESION;  Surgeon: Nunzio Cobbs, MD;  Location: [facility];  Service: Gynecology;  Laterality: N/A;  . MASS EXCISION N/A 11/03/2015   Procedure: EXCISION  URETHRAL MASS;  Surgeon: Alexis Frock, MD;  Location: [facility];  Service: Urology;  Laterality: N/A;  . TUBAL LIGATION    . VAGINAL HYSTERECTOMY  1999    Current Outpatient Prescriptions  Medication Sig Dispense Refill  . ALPRAZolam (XANAX) 1 MG tablet TAKE 1 TO 1 &1/2 TABLETS AT BEDTIME AS NEEDED . MAY FILL MONTHLY. (Patient taking differently: Take 1-1.5 mg by mouth at bedtime as needed for anxiety. TAKE 1 TO 1 &1/2 TABLETS AT BEDTIME AS NEEDED . MAY FILL  MONTHLY.) 45 tablet 5  . amLODipine-benazepril (LOTREL) 5-40 MG capsule TAKE (1) CAPSULE BY MOUTH ONCE DAILY. 30 capsule 5  . Biotin 1000 MCG tablet Take 1,000 mcg by mouth daily.    . fluorouracil (EFUDEX) 5 % cream Apply topically daily.  0  . hydrochlorothiazide (HYDRODIURIL) 25 MG tablet TAKE ONE TABLET BY MOUTH ONCE DAILY AS NEEDED FOR SWELLING. DO NOT EXCEED 3 PER WEEK. 30 tablet 5  . ibuprofen (ADVIL,MOTRIN) 600 MG tablet Take 1 tablet (600 mg total) by mouth every 6 (six) hours as needed (mild pain). 30 tablet 0  . Magnesium 200 MG TABS Take 1 tablet by mouth daily.    . methylcellulose (CITRUCEL) oral powder Take 1 packet by mouth daily.     Marland Kitchen senna-docusate (SENOKOT-S) 8.6-50 MG tablet Take 1 tablet by mouth 2 (two) times daily. While taking pain meds to prevent constipation. (Patient taking differently: Take 1 tablet by mouth daily as needed for mild constipation. While taking pain meds to prevent constipation.) 30 tablet 0   No current facility-administered medications for this visit.      ALLERGIES: Patient has no known allergies.  Family History  Problem Relation Age of Onset  . Hypertension Mother   . Cancer Father     MELANOMA  . Diabetes Father   . Hypertension Father   . Heart disease Father   . Cancer Sister     THYROID  . Breast cancer Paternal Grandmother   . Colon cancer Neg Hx     Social History   Social History  . Marital status: Widowed    Spouse name: N/A  . Number of children: N/A  . Years of education: N/A   Occupational History  . Not on file.   Social History Main Topics  . Smoking status: Never Smoker  . Smokeless tobacco: Never Used  . Alcohol use 6.6 - 7.8 oz/week    7 Glasses of wine, 4 - 6 Standard drinks or equivalent per week  . Drug use: No  . Sexual activity: No     Comment: Tubal/TVH--ovaries remain   Other Topics Concern  . Not on file   Social History Narrative  . No narrative on file    ROS:  Pertinent items are noted  in HPI.  PHYSICAL EXAMINATION:    BP (!) 142/70 (BP Location: Right Arm, Patient Position: Sitting, Cuff Size: Normal)   Pulse 60   Ht 5\' 6"  (1.676 m)   Wt 147 lb 6.4 oz (66.9 kg)   BMI 23.79 kg/m     General appearance: alert, cooperative and appears stated age   Abdomen: pfannenstiel incision intact and suture knot at left corner trimmer.  Abdomen is soft, non-tender, no masses,  no organomegaly   Pelvic: External genitalia:  SP incisions intact.  No lesions.  Suture trimmed at introitus.  Suture present on anterior and posterior vaginal walls.               Urethra:  normal appearing urethra with no masses, tenderness or lesions              Bartholins and Skenes: normal                 Vagina: normal appearing vagina with normal color and discharge, no lesions              Cervix: no lesions                Bimanual Exam:  Uterus:  normal size, contour, position, consistency, mobility, non-tender              Adnexa: no mass, fullness, tenderness               Chaperone was present for exam.  ASSESSMENT  Urinary frequency.  UTI.   PLAN  Urine micro and culture.  Ciprofloxacin 500 mg po bid for one week.  Hydrate well.  Follow up in 2 weeks.  I anticipate starting vaginal estrogen therapy then. If frequency is not better at that time and no infection, consider anticholinergic/antimuscarinic.   An After Visit Summary was printed and given to the patient.  _15____ minutes face to face time of which over 50% was spent in counseling.

## 2016-05-01 NOTE — Telephone Encounter (Signed)
Spoke with patient. Patient states she had surgery 03/28/16 and has been experiencing urinary urgency, frequency and pressure for last 1.5 weeks. Patient states she is getting up 5-6 times per night to urinate. Patient denies burning, pain, nausea or fever. Patient reports abdomen is non-distended. Recommended OV with Dr. Quincy Simmonds for further evaluation. Patient scheduled for 1/15 at 11am with Dr. Quincy Simmonds. Advised patient plan to keep 6 week post-op appointment on 1/22 unless advised otherwise by Dr. Quincy Simmonds. Patient verbalizes understanding and is agreeable to date and time.  Routing to provider for final review. Patient is agreeable to disposition. Will close encounter.  ABDOMINO SACROCOLPOPEXY (N/A Abdomen)  ANTERIOR (CYSTOCELE) AND POSTERIOR REPAIR (RECTOCELE) (N/A Vagina )  TRANSVAGINAL TAPE (TVT) PROCEDURE exact midurethral sling (N/A Vagina )  CYSTOSCOPY (N/A Urethra)  BILATERAL SALPINGECTOMY (Bilateral Abdomen)  LYSIS OF ADHESION (N/A Abdomen) [id number] CPT (R)]

## 2016-05-01 NOTE — Telephone Encounter (Signed)
Patient having a lot of urgency with urination for about a week or so.  Patient has an appointment 05/08/16 but wants to know if she should be seen sooner

## 2016-05-02 LAB — URINALYSIS, MICROSCOPIC ONLY
Bacteria, UA: NONE SEEN [HPF]
Casts: NONE SEEN [LPF]
Crystals: NONE SEEN [HPF]
RBC / HPF: NONE SEEN RBC/HPF (ref <=2)
Squamous Epithelial / HPF: NONE SEEN [HPF] (ref <=5)
WBC, UA: >60 WBC/HPF — AB (ref <=5)
Yeast: NONE SEEN [HPF]

## 2016-05-03 MED ORDER — AMPICILLIN 500 MG PO CAPS: 500.0000 mg | ORAL_CAPSULE | Freq: Four times a day (QID) | ORAL | 0 refills | Status: DC

## 2016-05-03 NOTE — Addendum Note (Signed)
Addended by: Yisroel Ramming, Dietrich Pates E on: 05/03/2016 08:53 PM   Modules accepted: Orders

## 2016-05-05 LAB — URINE CULTURE: Colony Count: >=100000

## 2016-05-08 ENCOUNTER — Ambulatory Visit: Payer: BLUE CROSS/BLUE SHIELD | Admitting: Obstetrics and Gynecology

## 2016-05-15 ENCOUNTER — Encounter: Payer: Self-pay | Admitting: Obstetrics and Gynecology

## 2016-05-15 ENCOUNTER — Ambulatory Visit (INDEPENDENT_AMBULATORY_CARE_PROVIDER_SITE_OTHER): Payer: BLUE CROSS/BLUE SHIELD | Admitting: Obstetrics and Gynecology

## 2016-05-15 VITALS — BP 120/80 | HR 72 | Resp 14 | Ht 66.0 in | Wt 148.2 lb

## 2016-05-15 DIAGNOSIS — Z9889 Other specified postprocedural states: Secondary | ICD-10-CM

## 2016-05-15 NOTE — Progress Notes (Signed)
GYNECOLOGY  VISIT   HPI: 61 y.o.   Widowed  Caucasian  female   G3P3 with No LMP recorded. Patient has had a hysterectomy.   here for 6 week post op  ABDOMINO SACROCOLPOPEXY (N/A Abdomen); ANTERIOR (CYSTOCELE) AND POSTERIOR REPAIR (RECTOCELE) (N/A Vagina ); TRANSVAGINAL TAPE (TVT) PROCEDURE exact midurethral sling (N/A Vagina ); CYSTOSCOPY (N/A Urethra); BILATERAL SALPINGECTOMY (Bilateral Abdomen); LYSIS OF ADHESION (N/A Abdomen - 03/28/16.  Treated for UTI on 05/01/16 with Ciprofloxacin.  Final result showed Enterococcus and patient switched to Ampicillin.  Urgency has improved.  Essentially gone.  Still finishing the Ampicillin.  Having difficulty taking 4 times a day.   Still has some urge with water running at the kitchen sink.  Used vaginal estrogen cream, Estrace, prior to surgery.   GYNECOLOGIC HISTORY: No LMP recorded. Patient has had a hysterectomy. Contraception:  Hysterectomy Menopausal hormone therapy:  none Last mammogram:  10/08/15 Bil.Implants/Density C/BIRADS 1 negative:TBC  Last pap smear:   10/11/15 ASCUS. HR HPV:+Detected. Colposcopy on 10/11/15 - no dysplasia        OB History    Gravida Para Term Preterm AB Living   3 3       3    SAB TAB Ectopic Multiple Live Births                     Patient Active Problem List   Diagnosis Date Noted  . Status post surgery 03/28/2016  . Carpal tunnel syndrome 07/02/2014  . Depression 01/12/2013  . Essential hypertension, benign 01/12/2013  . 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 Easy bruisability 06/26/2012  . Menopause 06/26/2012  . Insomnia 06/26/2012    Past Medical History:  Diagnosis Date  . Anxiety   . Arthritis    fingers  . 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 Hepatitis    age 56 , unknown type  . History of vulvar dysplasia    VIN 2  . Hypertension   . IBS (irritable bowel syndrome)   . Rectocele   .  Urethral pain    mass  . Urinary incontinence   . 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   . Wears contact lenses     Past Surgical History:  Procedure Laterality Date  . ABDOMINAL SACROCOLPOPEXY N/A 03/28/2016   Procedure: ABDOMINO SACROCOLPOPEXY;  Surgeon: Nunzio Cobbs, MD;  Location: [facility];  Service: Gynecology;  Laterality: N/A;  . ANTERIOR AND POSTERIOR REPAIR N/A 03/28/2016   Procedure: ANTERIOR (CYSTOCELE) AND POSTERIOR REPAIR (RECTOCELE);  Surgeon: Nunzio Cobbs, MD;  Location: [facility];  Service: Gynecology;  Laterality: N/A;  . AUGMENTATION MAMMAPLASTY  APRIL 2013   BREAST IMPLANT CHANGED  . BILATERAL SALPINGECTOMY Bilateral 03/28/2016   Procedure: BILATERAL SALPINGECTOMY;  Surgeon: Nunzio Cobbs, MD;  Location: [facility];  Service: Gynecology;  Laterality: Bilateral;  . BLADDER SUSPENSION N/A 03/28/2016   Procedure: TRANSVAGINAL TAPE (TVT) PROCEDURE exact midurethral sling;  Surgeon: Nunzio Cobbs, MD;  Location: [facility];  Service: Gynecology;  Laterality: N/A;  . COLONOSCOPY    . CYSTOSCOPY N/A 03/28/2016   Procedure: CYSTOSCOPY;  Surgeon: Nunzio Cobbs, MD;  Location: [facility];  Service: Gynecology;  Laterality: N/A;  . CYSTOSCOPY WITH BIOPSY Bilateral 11/03/2015   Procedure: CYSTOSCOPY WITH URETHRAL BIOPSY,  FULGERATION, BILATERAL RETROGRADE PYELOGRAMS;  Surgeon: Alexis Frock, MD;  Location: [facility];  Service: Urology;  Laterality: Bilateral;  . LYSIS OF ADHESION N/A 03/28/2016   Procedure: LYSIS OF ADHESION;  Surgeon: Nunzio Cobbs, MD;  Location: [facility];  Service: Gynecology;  Laterality: N/A;  . MASS EXCISION N/A 11/03/2015   Procedure: EXCISION  URETHRAL MASS;  Surgeon: Alexis Frock, MD;  Location: [facility];  Service: Urology;  Laterality: N/A;  . TUBAL LIGATION    . VAGINAL HYSTERECTOMY  1999    Current Outpatient  Prescriptions  Medication Sig Dispense Refill  . ALPRAZolam (XANAX) 1 MG tablet TAKE 1 TO 1 &1/2 TABLETS AT BEDTIME AS NEEDED . MAY FILL MONTHLY. (Patient taking differently: Take 1-1.5 mg by mouth at bedtime as needed for anxiety. TAKE 1 TO 1 &1/2 TABLETS AT BEDTIME AS NEEDED . MAY FILL MONTHLY.) 45 tablet 5  . amLODipine-benazepril (LOTREL) 5-40 MG capsule TAKE (1) CAPSULE BY MOUTH ONCE DAILY. 30 capsule 5  . ampicillin (PRINCIPEN) 500 MG capsule Take 1 capsule (500 mg total) by mouth 4 (four) times daily. 28 capsule 0  . Biotin 1000 MCG tablet Take 1,000 mcg by mouth daily.    . fluorouracil (EFUDEX) 5 % cream Apply topically daily.  0  . hydrochlorothiazide (HYDRODIURIL) 25 MG tablet TAKE ONE TABLET BY MOUTH ONCE DAILY AS NEEDED FOR SWELLING. DO NOT EXCEED 3 PER WEEK. 30 tablet 5  . ibuprofen (ADVIL,MOTRIN) 600 MG tablet Take 1 tablet (600 mg total) by mouth every 6 (six) hours as needed (mild pain). 30 tablet 0  . Magnesium 200 MG TABS Take 1 tablet by mouth daily.    . methylcellulose (CITRUCEL) oral powder Take 1 packet by mouth daily.     Marland Kitchen senna-docusate (SENOKOT-S) 8.6-50 MG tablet Take 1 tablet by mouth 2 (two) times daily. While taking pain meds to prevent constipation. (Patient taking differently: Take 1 tablet by mouth daily as needed for mild constipation. While taking pain meds to prevent constipation.) 30 tablet 0   No current facility-administered medications for this visit.      ALLERGIES: Patient has no known allergies.  Family History  Problem Relation Age of Onset  . Hypertension Mother   . Cancer Father     MELANOMA  . Diabetes Father   . Hypertension Father   . Heart disease Father   . Cancer Sister     THYROID  . Breast cancer Paternal Grandmother   . Colon cancer Neg Hx     Social History   Social History  . Marital status: Widowed    Spouse name: N/A  . Number of children: N/A  . Years of education: N/A   Occupational History  . Not on file.    Social History Main Topics  . Smoking status: Never Smoker  . Smokeless tobacco: Never Used  . Alcohol use 6.6 - 7.8 oz/week    7 Glasses of wine, 4 - 6 Standard drinks or equivalent per week  . Drug use: No  . Sexual activity: No     Comment: Tubal/TVH--ovaries remain   Other Topics Concern  . Not on file   Social History Narrative  . No narrative on file    ROS:  Pertinent items are noted in HPI.  PHYSICAL EXAMINATION:    BP 120/80 (BP Location: Right Arm, Patient Position: Sitting, Cuff Size: Normal)   Pulse 72   Resp 14   Ht 5\' 6"  (1.676 m)  Wt 148 lb 3.2 oz (67.2 kg)   BMI 23.92 kg/m     General appearance: alert, cooperative and appears stated age   Pelvic: External genitalia:  SP incisions intact.              Urethra:  normal appearing urethra with no masses, tenderness or lesions              Bartholins and Skenes: normal                 Vagina: normal appearing vagina with normal color and discharge, no lesions.  No mesh exposure or erosion.  Good support.              Cervix:  Absent.                Bimanual Exam:  Uterus: absest.               Adnexa: no mass, fullness, tenderness          Chaperone was present for exam.  ASSESSMENT  Post op urinary urgency improved status post tx of Enterococcus UTI.  PLAN  Start vaginal Estrace cream 1/2 gram pv at hs twice weekly.  Continue decreased activity.  Follow up for 3 month post op visit.    An After Visit Summary was printed and given to the patient.

## 2016-06-19 NOTE — Progress Notes (Signed)
GYNECOLOGY  VISIT   HPI: 61 y.o.   Widowed  Caucasian  female   G3P3 with No LMP recorded. Patient has had a hysterectomy.   here for 12 week follow up ABDOMINO SACROCOLPOPEXY (N/A Abdomen) ANTERIOR (CYSTOCELE) AND POSTERIOR REPAIR (RECTOCELE) (N/A Vagina ) TRANSVAGINAL TAPE (TVT) PROCEDURE exact midurethral sling (N/A Vagina ) CYSTOSCOPY (N/A Urethra) BILATERAL SALPINGECTOMY (Bilateral Abdomen) LYSIS OF ADHESION (N/A Abdomen) [id number] CPT (R)].  Reporting depression and feeling stressed out due to inactivity.  Feels like she has aged with all of the surgery and procedures she has had since age 40.  Gained 5 pounds.   Had Premarin cream at home.  Using twice a week.  Bladder urgency has resolved but notes it when she is arriving home at the end of the day or washing dishes. No urinary incontinence.  Voiding well.  Having regular BMs. Using Colace daily.     GYNECOLOGIC HISTORY: No LMP recorded. Patient has had a hysterectomy. Contraception:  Hysterectomy Menopausal hormone therapy:  none Last mammogram: 10/08/15 Bil.Implants/Density C/BIRADS 1 negative:TBC  Last pap smear:   10/11/15 ASCUS. HR HPV:+Detected. Colposcopy on 10/11/15 - no dysplasia;04-28-14 Negative        OB History    Gravida Para Term Preterm AB Living   3 3       3    SAB TAB Ectopic Multiple Live Births                     Patient Active Problem List   Diagnosis Date Noted  . Status post surgery 03/28/2016  . Carpal tunnel syndrome 07/02/2014  . Depression 01/12/2013  . Essential hypertension, benign 01/12/2013  . 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 Easy bruisability 06/26/2012  . Menopause 06/26/2012  . Insomnia 06/26/2012    Past Medical History:  Diagnosis Date  . Anxiety   . Arthritis    fingers  . 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 Hepatitis    age 19 , unknown type  . History of vulvar  dysplasia    VIN 2  . Hypertension   . IBS (irritable bowel syndrome)   . Rectocele   . Urethral pain    mass  . Urinary incontinence   . 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   . Wears contact lenses     Past Surgical History:  Procedure Laterality Date  . ABDOMINAL SACROCOLPOPEXY N/A 03/28/2016   Procedure: ABDOMINO SACROCOLPOPEXY;  Surgeon: Nunzio Cobbs, MD;  Location: [facility];  Service: Gynecology;  Laterality: N/A;  . ANTERIOR AND POSTERIOR REPAIR N/A 03/28/2016   Procedure: ANTERIOR (CYSTOCELE) AND POSTERIOR REPAIR (RECTOCELE);  Surgeon: Nunzio Cobbs, MD;  Location: [facility];  Service: Gynecology;  Laterality: N/A;  . AUGMENTATION MAMMAPLASTY  APRIL 2013   BREAST IMPLANT CHANGED  . BILATERAL SALPINGECTOMY Bilateral 03/28/2016   Procedure: BILATERAL SALPINGECTOMY;  Surgeon: Nunzio Cobbs, MD;  Location: [facility];  Service: Gynecology;  Laterality: Bilateral;  . BLADDER SUSPENSION N/A 03/28/2016   Procedure: TRANSVAGINAL TAPE (TVT) PROCEDURE exact midurethral sling;  Surgeon: Nunzio Cobbs, MD;  Location: [facility];  Service: Gynecology;  Laterality: N/A;  . COLONOSCOPY    . CYSTOSCOPY N/A 03/28/2016   Procedure: CYSTOSCOPY;  Surgeon: Nunzio Cobbs, MD;  Location:  [facility];  Service: Gynecology;  Laterality: N/A;  . CYSTOSCOPY WITH BIOPSY Bilateral 11/03/2015   Procedure: CYSTOSCOPY WITH URETHRAL BIOPSY, FULGERATION, BILATERAL RETROGRADE PYELOGRAMS;  Surgeon: Alexis Frock, MD;  Location: [facility];  Service: Urology;  Laterality: Bilateral;  . LYSIS OF ADHESION N/A 03/28/2016   Procedure: LYSIS OF ADHESION;  Surgeon: Nunzio Cobbs, MD;  Location: [facility];  Service: Gynecology;  Laterality: N/A;  . MASS EXCISION N/A 11/03/2015   Procedure: EXCISION  URETHRAL MASS;  Surgeon: Alexis Frock, MD;  Location: [facility];  Service: Urology;   Laterality: N/A;  . TUBAL LIGATION    . VAGINAL HYSTERECTOMY  1999    Current Outpatient Prescriptions  Medication Sig Dispense Refill  . ALPRAZolam (XANAX) 1 MG tablet TAKE 1 TO 1 &1/2 TABLETS AT BEDTIME AS NEEDED . MAY FILL MONTHLY. (Patient taking differently: Take 1-1.5 mg by mouth at bedtime as needed for anxiety. TAKE 1 TO 1 &1/2 TABLETS AT BEDTIME AS NEEDED . MAY FILL MONTHLY.) 45 tablet 5  . amLODipine-benazepril (LOTREL) 5-40 MG capsule TAKE (1) CAPSULE BY MOUTH ONCE DAILY. 30 capsule 5  . Biotin 1000 MCG tablet Take 1,000 mcg by mouth daily.    . hydrochlorothiazide (HYDRODIURIL) 25 MG tablet TAKE ONE TABLET BY MOUTH ONCE DAILY AS NEEDED FOR SWELLING. DO NOT EXCEED 3 PER WEEK. 30 tablet 5  . ibuprofen (ADVIL,MOTRIN) 600 MG tablet Take 1 tablet (600 mg total) by mouth every 6 (six) hours as needed (mild pain). 30 tablet 0  . Magnesium 200 MG TABS Take 1 tablet by mouth daily.    . methylcellulose (CITRUCEL) oral powder Take 1 packet by mouth daily.     Marland Kitchen senna-docusate (SENOKOT-S) 8.6-50 MG tablet Take 1 tablet by mouth 2 (two) times daily. While taking pain meds to prevent constipation. (Patient taking differently: Take 1 tablet by mouth daily as needed for mild constipation. While taking pain meds to prevent constipation.) 30 tablet 0   No current facility-administered medications for this visit.      ALLERGIES: Patient has no known allergies.  Family History  Problem Relation Age of Onset  . Hypertension Mother   . Cancer Father     MELANOMA  . Diabetes Father   . Hypertension Father   . Heart disease Father   . Cancer Sister     THYROID  . Breast cancer Paternal Grandmother   . Colon cancer Neg Hx     Social History   Social History  . Marital status: Widowed    Spouse name: N/A  . Number of children: N/A  . Years of education: N/A   Occupational History  . Not on file.   Social History Main Topics  . Smoking status: Never Smoker  . Smokeless tobacco:  Never Used  . Alcohol use 6.6 - 7.8 oz/week    7 Glasses of wine, 4 - 6 Standard drinks or equivalent per week  . Drug use: No  . Sexual activity: No     Comment: Tubal/TVH--ovaries remain   Other Topics Concern  . Not on file   Social History Narrative  . No narrative on file    ROS:  Pertinent items are noted in HPI.  PHYSICAL EXAMINATION:    BP 124/70 (BP Location: Right Arm, Patient Position: Sitting, Cuff Size: Normal)   Pulse 64   Resp 16   Ht 5\' 6"  (1.676 m)   Wt 150 lb (68 kg)   BMI 24.21 kg/m  General appearance: alert, cooperative and appears stated age   Pelvic: External genitalia:  no lesions              Urethra:  normal appearing urethra with no masses, tenderness or lesions              Bartholins and Skenes: normal                 Vagina: normal appearing vagina with normal color and discharge, no lesions.  Good support and vaginal length.               Cervix:  Absent.                Bimanual Exam:  Uterus:  Absent.               Adnexa: no mass, fullness, tenderness              Rectal exam: Yes.  .  Confirms.              Anus:  normal sphincter tone, no lesions  Chaperone was present for exam.  ASSESSMENT  Doing well post op.  Situational stress/depression.   PLAN  Return to all normal activities including exercise and sexual activity.  Ok to continue Colace daily.  Avoid extreme lifting but no daily lifting restrictions.  Continue Premarin vaginal cream  Rx given.  Follow up in 6 mo for annual exam.  Mammogram in June.  Call if symptoms of anxiety and/or depression continue despite returning to normal activity.    An After Visit Summary was printed and given to the patient.

## 2016-06-21 ENCOUNTER — Encounter: Payer: Self-pay | Admitting: Obstetrics and Gynecology

## 2016-06-21 ENCOUNTER — Ambulatory Visit (INDEPENDENT_AMBULATORY_CARE_PROVIDER_SITE_OTHER): Payer: BLUE CROSS/BLUE SHIELD | Admitting: Obstetrics and Gynecology

## 2016-06-21 VITALS — BP 124/70 | HR 64 | Resp 16 | Ht 66.0 in | Wt 150.0 lb

## 2016-06-21 DIAGNOSIS — Z9889 Other specified postprocedural states: Secondary | ICD-10-CM

## 2016-06-21 MED ORDER — ESTROGENS, CONJUGATED 0.625 MG/GM VA CREA
1.0000 | TOPICAL_CREAM | Freq: Every day | VAGINAL | 1 refills | Status: DC
Start: 2016-06-21 — End: 2017-01-15

## 2016-06-29 ENCOUNTER — Encounter: Payer: Self-pay | Admitting: Family Medicine

## 2016-06-29 ENCOUNTER — Ambulatory Visit (INDEPENDENT_AMBULATORY_CARE_PROVIDER_SITE_OTHER): Payer: BLUE CROSS/BLUE SHIELD | Admitting: Family Medicine

## 2016-06-29 VITALS — BP 138/88 | Ht 66.0 in | Wt 152.0 lb

## 2016-06-29 DIAGNOSIS — G8929 Other chronic pain: Secondary | ICD-10-CM

## 2016-06-29 DIAGNOSIS — M533 Sacrococcygeal disorders, not elsewhere classified: Secondary | ICD-10-CM | POA: Diagnosis not present

## 2016-06-29 DIAGNOSIS — R5383 Other fatigue: Secondary | ICD-10-CM

## 2016-06-29 DIAGNOSIS — I10 Essential (primary) hypertension: Secondary | ICD-10-CM | POA: Diagnosis not present

## 2016-06-29 DIAGNOSIS — Z79899 Other long term (current) drug therapy: Secondary | ICD-10-CM

## 2016-06-29 DIAGNOSIS — Z13220 Encounter for screening for lipoid disorders: Secondary | ICD-10-CM

## 2016-06-29 DIAGNOSIS — F5101 Primary insomnia: Secondary | ICD-10-CM | POA: Diagnosis not present

## 2016-06-29 MED ORDER — HYDROCHLOROTHIAZIDE 25 MG PO TABS: ORAL_TABLET | ORAL | 5 refills | Status: DC

## 2016-06-29 MED ORDER — ALPRAZOLAM 1 MG PO TABS: ORAL_TABLET | ORAL | 5 refills | Status: DC

## 2016-06-29 MED ORDER — AMLODIPINE BESY-BENAZEPRIL HCL 5-40 MG PO CAPS: ORAL_CAPSULE | ORAL | 5 refills | Status: DC

## 2016-06-29 NOTE — Progress Notes (Signed)
   Subjective:    Patient ID: Stephanie Armstrong, female    DOB: 08-Jan-1956, 61 y.o.   MRN: [id number]  Hypertension  This is a chronic problem. Treatments tried: lotrel. There are no compliance problems.    Blood pressure medicine and blood pressure levels reviewed today with patient. Compliant with blood pressure medicine. States does not miss a dose. No obvious side effects. Blood pressure generally good when checked elsewhere. Watching salt intake.   Just started exercising again after the Y  Right post back  Gets to aching and hurting worse with turning, min rad , r lowr bk pain, will take a coule alwe e for it . Uses topical biofreeze              Low back pain right side off and on for a long time. Worse when turning over in bed or sitting for a while.   Patient compliant with insomnia medication. Generally takes most nights. No obvious morning drowsiness. Definitely helps patient sleep. Without it patient states would not get a good nights rest.   Review of Systems No headache, no major weight loss or weight gain, no chest pain no back pain abdominal pain no change in bowel habits complete ROS otherwise negative     Objective:   Physical Exam  Alert and oriented, vitals reviewed and stable, NAD ENT-TM's and ext canals WNL bilat via otoscopic exam Soft palate, tonsils and post pharynx WNL via oropharyngeal exam Neck-symmetric, no masses; thyroid nonpalpable and nontender Pulmonary-no tachypnea or accessory muscle use; Clear without wheezes via auscultation Card--no abnrml murmurs, rhythm reg and rate WNL Carotid pulses symmetric, without bruits Negative right CVA tenderness negative straight leg raise negative paraspinal tenderness some SI joint tenderness to deep palpation good range of motion      Assessment & Plan:  Impression 1 hypertension discussed good control maintain same meds #2 SI joint pain/inflammation discussed local measures discussed exercise  discussed anti-inflammatory when necessary #3 insomnia with element of anxiety continue Xanax. Plan exercise diet discussed. Medications refilled. Local measures discussed WSL

## 2016-07-08 DIAGNOSIS — I10 Essential (primary) hypertension: Secondary | ICD-10-CM | POA: Diagnosis not present

## 2016-07-08 DIAGNOSIS — R5383 Other fatigue: Secondary | ICD-10-CM | POA: Diagnosis not present

## 2016-07-08 DIAGNOSIS — Z79899 Other long term (current) drug therapy: Secondary | ICD-10-CM | POA: Diagnosis not present

## 2016-07-08 DIAGNOSIS — Z13220 Encounter for screening for lipoid disorders: Secondary | ICD-10-CM | POA: Diagnosis not present

## 2016-07-09 ENCOUNTER — Encounter: Payer: Self-pay | Admitting: Family Medicine

## 2016-07-09 LAB — HEPATIC FUNCTION PANEL
ALT: 16 [IU]/L (ref 0–32)
AST: 18 [IU]/L (ref 0–40)
Albumin: 4.4 g/dL (ref 3.6–4.8)
Alkaline Phosphatase: 66 [IU]/L (ref 39–117)
Bilirubin Total: 0.3 mg/dL (ref 0.0–1.2)
Bilirubin, Direct: 0.1 mg/dL (ref 0.00–0.40)
Total Protein: 6.3 g/dL (ref 6.0–8.5)

## 2016-07-09 LAB — LIPID PANEL
Chol/HDL Ratio: 2.2 {ratio} (ref 0.0–4.4)
Cholesterol, Total: 201 mg/dL — ABNORMAL HIGH (ref 100–199)
HDL: 91 mg/dL (ref >39)
LDL Calculated: 99 mg/dL (ref 0–99)
Triglycerides: 56 mg/dL (ref 0–149)
VLDL Cholesterol Cal: 11 mg/dL (ref 5–40)

## 2016-07-09 LAB — BASIC METABOLIC PANEL
BUN/Creatinine Ratio: 27 (ref 12–28)
BUN: 19 mg/dL (ref 8–27)
CO2: 26 mmol/L (ref 18–29)
Calcium: 9.2 mg/dL (ref 8.7–10.3)
Chloride: 104 mmol/L (ref 96–106)
Creatinine, Ser: 0.7 mg/dL (ref 0.57–1.00)
GFR calc Af Amer: 108 mL/min/{1.73_m2} (ref >59)
GFR calc non Af Amer: 94 mL/min/{1.73_m2} (ref >59)
Glucose: 90 mg/dL (ref 65–99)
Potassium: 4.1 mmol/L (ref 3.5–5.2)
Sodium: 144 mmol/L (ref 134–144)

## 2016-07-09 LAB — TSH: TSH: 1.57 u[IU]/mL (ref 0.450–4.500)

## 2016-08-29 ENCOUNTER — Other Ambulatory Visit: Payer: Self-pay | Admitting: Obstetrics and Gynecology

## 2016-08-29 DIAGNOSIS — Z1231 Encounter for screening mammogram for malignant neoplasm of breast: Secondary | ICD-10-CM

## 2016-09-06 ENCOUNTER — Ambulatory Visit (INDEPENDENT_AMBULATORY_CARE_PROVIDER_SITE_OTHER): Payer: BLUE CROSS/BLUE SHIELD | Admitting: Family Medicine

## 2016-09-06 ENCOUNTER — Encounter: Payer: Self-pay | Admitting: Family Medicine

## 2016-09-06 DIAGNOSIS — F411 Generalized anxiety disorder: Secondary | ICD-10-CM | POA: Diagnosis not present

## 2016-09-06 MED ORDER — ESCITALOPRAM OXALATE 10 MG PO TABS: 10.0000 mg | ORAL_TABLET | Freq: Every day | ORAL | 5 refills | Status: DC

## 2016-09-06 NOTE — Progress Notes (Signed)
   Subjective:    Patient ID: Stephanie Armstrong, female    DOB: Apr 17, 1956, 61 y.o.   MRN: [id number]  Anxiety  Presents for follow-up visit. Symptoms include depressed mood and excessive worry. Patient reports no shortness of breath. The quality of sleep is good.     Finds herself worrying a lot  About future finances  Worries about the stress of every day living  Also having incr amnt of anxiety   Son moved to [place], stressing pt out  woners I xanax might decr motivation, soetimes feels it causes it as a side effect  Feels diminished snse of purpose  Dim energy at times  Hx of anxiety  streess not bad a tjob, tho sometimes too much time to thingk   depr sadness not as much of a problem   No suicidal thoughts. Anxiety definitely worsening   Review of Systems  Respiratory: Negative for shortness of breath.        Objective:   Physical Exam Alert and oriented, vitals reviewed and stable, NAD ENT-TM's and ext canals WNL bilat via otoscopic exam Soft palate, tonsils and post pharynx WNL via oropharyngeal exam Neck-symmetric, no masses; thyroid nonpalpable and nontender Pulmonary-no tachypnea or accessory muscle use; Clear without wheezes via auscultation Card--no abnrml murmurs, rhythm reg and rate WNL Carotid pulses symmetric, without bruits        Assessment & Plan:  Impression generalized anxiety disorder long discussion held. Regular exercise helpful. Maintain Xanax. Would prefer not to go up on those rationale discussed. Initiate Lexapro. 10 mg daily. Side effects benefits discussed at length follow-up regular visit

## 2016-09-06 NOTE — Patient Instructions (Signed)
Generalized Anxiety Disorder, Adult Generalized anxiety disorder (GAD) is a mental health disorder. People with this condition constantly worry about everyday events. Unlike normal anxiety, worry related to GAD is not triggered by a specific event. These worries also do not fade or get better with time. GAD interferes with life functions, including relationships, work, and school. GAD can vary from mild to severe. People with severe GAD can have intense waves of anxiety with physical symptoms (panic attacks). What are the causes? The exact cause of GAD is not known. What increases the risk? This condition is more likely to develop in:  Women.  People who have a family history of anxiety disorders.  People who are very shy.  People who experience very stressful life events, such as the death of a loved one.  People who have a very stressful family environment. What are the signs or symptoms? People with GAD often worry excessively about many things in their lives, such as their health and family. They may also be overly concerned about:  Doing well at work.  Being on time.  Natural disasters.  Friendships. Physical symptoms of GAD include:  Fatigue.  Muscle tension or having muscle twitches.  Trembling or feeling shaky.  Being easily startled.  Feeling like your heart is pounding or racing.  Feeling out of breath or like you cannot take a deep breath.  Having trouble falling asleep or staying asleep.  Sweating.  Nausea, diarrhea, or irritable bowel syndrome (IBS).  Headaches.  Trouble concentrating or remembering facts.  Restlessness.  Irritability. How is this diagnosed? Your health care provider can diagnose GAD based on your symptoms and medical history. You will also have a physical exam. The health care provider will ask specific questions about your symptoms, including how severe they are, when they started, and if they come and go. Your health care  provider may ask you about your use of alcohol or drugs, including prescription medicines. Your health care provider may refer you to a mental health specialist for further evaluation. Your health care provider will do a thorough examination and may perform additional tests to rule out other possible causes of your symptoms. To be diagnosed with GAD, a person must have anxiety that:  Is out of his or her control.  Affects several different aspects of his or her life, such as work and relationships.  Causes distress that makes him or her unable to take part in normal activities.  Includes at least three physical symptoms of GAD, such as restlessness, fatigue, trouble concentrating, irritability, muscle tension, or sleep problems. Before your health care provider can confirm a diagnosis of GAD, these symptoms must be present more days than they are not, and they must last for six months or longer. How is this treated? The following therapies are usually used to treat GAD:  Medicine. Antidepressant medicine is usually prescribed for long-term daily control. Antianxiety medicines may be added in severe cases, especially when panic attacks occur.  Talk therapy (psychotherapy). Certain types of talk therapy can be helpful in treating GAD by providing support, education, and guidance. Options include:  Cognitive behavioral therapy (CBT). People learn coping skills and techniques to ease their anxiety. They learn to identify unrealistic or negative thoughts and behaviors and to replace them with positive ones.  Acceptance and commitment therapy (ACT). This treatment teaches people how to be mindful as a way to cope with unwanted thoughts and feelings.  Biofeedback. This process trains you to manage your body's response (  physiological response) through breathing techniques and relaxation methods. You will work with a therapist while machines are used to monitor your physical symptoms.  Stress  management techniques. These include yoga, meditation, and exercise. A mental health specialist can help determine which treatment is best for you. Some people see improvement with one type of therapy. However, other people require a combination of therapies. Follow these instructions at home:  Take over-the-counter and prescription medicines only as told by your health care provider.  Try to maintain a normal routine.  Try to anticipate stressful situations and allow extra time to manage them.  Practice any stress management or self-calming techniques as taught by your health care provider.  Do not punish yourself for setbacks or for not making progress.  Try to recognize your accomplishments, even if they are small.  Keep all follow-up visits as told by your health care provider. This is important. Contact a health care provider if:  Your symptoms do not get better.  Your symptoms get worse.  You have signs of depression, such as:  A persistently sad, cranky, or irritable mood.  Loss of enjoyment in activities that used to bring you joy.  Change in weight or eating.  Changes in sleeping habits.  Avoiding friends or family members.  Loss of energy for normal tasks.  Feelings of guilt or worthlessness. Get help right away if:  You have serious thoughts about hurting yourself or others. If you ever feel like you may hurt yourself or others, or have thoughts about taking your own life, get help right away. You can go to your nearest emergency department or call:  Your local emergency services (911 in the [place]  A suicide crisis helpline, such as the National Suicide Prevention Lifeline at 1-800-273-8255. This is open 24 hours a day. Summary  Generalized anxiety disorder (GAD) is a mental health disorder that involves worry that is not triggered by a specific event.  People with GAD often worry excessively about many things in their lives, such as their health and  family.  GAD may cause physical symptoms such as restlessness, trouble concentrating, sleep problems, frequent sweating, nausea, diarrhea, headaches, and trembling or muscle twitching.  A mental health specialist can help determine which treatment is best for you. Some people see improvement with one type of therapy. However, other people require a combination of therapies. This information is not intended to replace advice given to you by your health care provider. Make sure you discuss any questions you have with your health care provider. Document Released: 07/29/2012 Document Revised: 02/22/2016 Document Reviewed: 02/22/2016 Elsevier Interactive Patient Education  2017 [employer]vier Inc.  

## 2016-10-09 ENCOUNTER — Ambulatory Visit: Payer: BLUE CROSS/BLUE SHIELD

## 2016-10-10 ENCOUNTER — Ambulatory Visit: Payer: BLUE CROSS/BLUE SHIELD

## 2016-10-30 DIAGNOSIS — N951 Menopausal and female climacteric states: Secondary | ICD-10-CM | POA: Diagnosis not present

## 2016-10-30 DIAGNOSIS — R635 Abnormal weight gain: Secondary | ICD-10-CM | POA: Diagnosis not present

## 2016-11-01 ENCOUNTER — Ambulatory Visit
Admission: RE | Admit: 2016-11-01 | Discharge: 2016-11-01 | Disposition: A | Discharge status: Home / Self Care | Payer: BLUE CROSS/BLUE SHIELD | Source: Ambulatory Visit | Attending: Obstetrics and Gynecology | Admitting: Obstetrics and Gynecology

## 2016-11-01 DIAGNOSIS — Z1231 Encounter for screening mammogram for malignant neoplasm of breast: Secondary | ICD-10-CM | POA: Diagnosis not present

## 2016-11-06 DIAGNOSIS — G4700 Insomnia, unspecified: Secondary | ICD-10-CM | POA: Diagnosis not present

## 2016-11-06 DIAGNOSIS — I10 Essential (primary) hypertension: Secondary | ICD-10-CM | POA: Diagnosis not present

## 2016-11-06 DIAGNOSIS — Z87898 Personal history of other specified conditions: Secondary | ICD-10-CM | POA: Diagnosis not present

## 2016-11-06 DIAGNOSIS — R5383 Other fatigue: Secondary | ICD-10-CM | POA: Diagnosis not present

## 2016-11-06 DIAGNOSIS — E559 Vitamin D deficiency, unspecified: Secondary | ICD-10-CM | POA: Diagnosis not present

## 2016-11-10 DIAGNOSIS — Z87898 Personal history of other specified conditions: Secondary | ICD-10-CM | POA: Diagnosis not present

## 2016-11-10 DIAGNOSIS — R232 Flushing: Secondary | ICD-10-CM | POA: Diagnosis not present

## 2016-11-10 DIAGNOSIS — G4700 Insomnia, unspecified: Secondary | ICD-10-CM | POA: Diagnosis not present

## 2016-11-20 DIAGNOSIS — I10 Essential (primary) hypertension: Secondary | ICD-10-CM | POA: Diagnosis not present

## 2016-11-20 DIAGNOSIS — E559 Vitamin D deficiency, unspecified: Secondary | ICD-10-CM | POA: Diagnosis not present

## 2016-11-20 DIAGNOSIS — Z87898 Personal history of other specified conditions: Secondary | ICD-10-CM | POA: Diagnosis not present

## 2016-12-20 DIAGNOSIS — L720 Epidermal cyst: Secondary | ICD-10-CM | POA: Diagnosis not present

## 2016-12-20 DIAGNOSIS — T63441A Toxic effect of venom of bees, accidental (unintentional), initial encounter: Secondary | ICD-10-CM | POA: Diagnosis not present

## 2016-12-20 DIAGNOSIS — L570 Actinic keratosis: Secondary | ICD-10-CM | POA: Diagnosis not present

## 2016-12-22 ENCOUNTER — Ambulatory Visit: Payer: BLUE CROSS/BLUE SHIELD | Admitting: Obstetrics and Gynecology

## 2016-12-28 ENCOUNTER — Ambulatory Visit: Payer: BLUE CROSS/BLUE SHIELD | Admitting: Family Medicine

## 2016-12-28 ENCOUNTER — Encounter: Payer: Self-pay | Admitting: Family Medicine

## 2016-12-28 ENCOUNTER — Ambulatory Visit (INDEPENDENT_AMBULATORY_CARE_PROVIDER_SITE_OTHER): Payer: BLUE CROSS/BLUE SHIELD | Admitting: Family Medicine

## 2016-12-28 VITALS — BP 124/76 | Temp 98.1°F | Ht 66.0 in | Wt 155.0 lb

## 2016-12-28 DIAGNOSIS — J329 Chronic sinusitis, unspecified: Secondary | ICD-10-CM

## 2016-12-28 DIAGNOSIS — J310 Chronic rhinitis: Secondary | ICD-10-CM

## 2016-12-28 MED ORDER — HYDROCODONE-HOMATROPINE 5-1.5 MG/5ML PO SYRP: ORAL_SOLUTION | ORAL | 0 refills | Status: DC

## 2016-12-28 MED ORDER — CEFPROZIL 500 MG PO TABS: 500.0000 mg | ORAL_TABLET | Freq: Two times a day (BID) | ORAL | 0 refills | Status: DC

## 2016-12-28 NOTE — Progress Notes (Signed)
   Subjective:    Patient ID: Stephanie Armstrong, female    DOB: 1956/01/09, 61 y.o.   MRN: [id number]  Sore Throat   This is a new problem. The current episode started in the past 7 days. Associated symptoms include congestion and coughing. Associated symptoms comments: Muscle aches . Treatments tried: Cough syrup, OTC cold medication.   States no other concerns this visit.   Started sat with throat scratch  Throat hurting  Feeling tight    anypt felt flushed and feverish     No major gunkines   Head feels full   bilat ccougha dn headache   Cough nfeels bad some gunkiness  Some exposures recalls none   Used th dayquil product caps and rob plain ad lemon tea      Review of Systems  HENT: Positive for congestion.   Respiratory: Positive for cough.        Objective:   Physical Exam Alert, mild malaise. Hydration good Vitals stable. frontal/ maxillary tenderness evident positive nasal congestion. pharynx normal neck supple  lungs clear/no crackles or wheezes. heart regular in rhythm        Assessment & Plan:  Impression rhinosinusitis likely post viral, discussed with patient. plan antibiotics prescribed. Questions answered. Symptomatic care discussed. warning signs discussed. WSL

## 2017-01-05 ENCOUNTER — Ambulatory Visit (INDEPENDENT_AMBULATORY_CARE_PROVIDER_SITE_OTHER): Payer: BLUE CROSS/BLUE SHIELD | Admitting: Family Medicine

## 2017-01-05 VITALS — BP 122/82 | Ht 60.0 in | Wt 155.4 lb

## 2017-01-05 DIAGNOSIS — I10 Essential (primary) hypertension: Secondary | ICD-10-CM | POA: Diagnosis not present

## 2017-01-05 DIAGNOSIS — F5101 Primary insomnia: Secondary | ICD-10-CM

## 2017-01-05 DIAGNOSIS — F411 Generalized anxiety disorder: Secondary | ICD-10-CM | POA: Diagnosis not present

## 2017-01-05 MED ORDER — ALPRAZOLAM 1 MG PO TABS: ORAL_TABLET | ORAL | 5 refills | Status: DC

## 2017-01-05 MED ORDER — AMLODIPINE BESY-BENAZEPRIL HCL 5-40 MG PO CAPS: ORAL_CAPSULE | ORAL | 5 refills | Status: DC

## 2017-01-05 MED ORDER — HYDROCHLOROTHIAZIDE 25 MG PO TABS: ORAL_TABLET | ORAL | 5 refills | Status: DC

## 2017-01-05 NOTE — Progress Notes (Signed)
   Subjective:    Patient ID: Stephanie Armstrong, female    DOB: Jan 18, 1956, 61 y.o.   MRN: [id number]  Hypertension  This is a chronic problem. The current episode started more than 1 year ago. Risk factors for coronary artery disease include post-menopausal state. Treatments tried: lotrel. There are no compliance problems.    Cough and cong some but muc better  Took lexapro for about a month, was gaining weight and so stopped. Would prefer to stay off chronic daily meds   Blood pressure medicine and blood pressure levels reviewed today with patient. Compliant with blood pressure medicine. States does not miss a dose. No obvious side effects. Blood pressure generally good when checked elsewhere. Watching salt intake.   Sticking with b p meds faithfully   still tking reg  Wanting to exercise more, but has been hot   Review of Systems No headache, no major weight loss or weight gain, no chest pain no back pain abdominal pain no change in bowel habits complete ROS otherwise negative     Objective:   Physical Exam  Alert vitals stable, NAD. Blood pressure good on repeat. HEENT normal. Lungs clear. Heart regular rate and rhythm.       Assessment & Plan:  Impression 1 hypertension good control discussed maintain same meds  #2 chronic anxiety. Patient stopped Lexapro. Thought it was causing weight gain. Uses Xanax and feels that it is sufficient for now  Plan exercise discussed diet discussed medications prescribed. Patient declines flu shot for now

## 2017-01-12 NOTE — Progress Notes (Signed)
61 y.o. G10P3 Widowed Caucasian female here for annual exam.    Uses Premarin vaginal cream twice a week.   ROS:  Weight gain.  States her exercise classed changed.  No urinary stress incontinence.  Uses stool softener twice a week.   Does labs with PCP.  Has urology follow up today with Dr. Tresa Moore.   PCP:  Owens Shark, MD   No LMP recorded. Patient has had a hysterectomy.             Sexually active: No.  The current method of family planning is status post Tubal/hysterectomy.    Exercising: Yes.    walking Smoker:  no  Health Maintenance: Pap: 10-11-15 ASCUS:Pos HR HPV, 04-28-14 Neg History of abnormal Pap:  Yes, 10-11-15 ASCUS:Pos HR HPV-- Colposcopy of vagina and vulva on 10/11/15--  Bx of right and left vaginal apices showed no atypia or dysplasia.  Biopsies of the right and left labia majora also negative for atypia and dysplasia.  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 BMD:  07-30-12  Result :Osteopenia--[facility] TDaP:  PCP Gardasil:   no HIV:no Hep C:no Screening Labs:  Hb today: PCP, Urine today: not done   reports that she has never smoked. She has never used smokeless tobacco. She reports that she drinks about 6.0 - 7.2 oz of alcohol per week . She reports that she does not use drugs.  Past Medical History:  Diagnosis Date  . Anxiety   . Arthritis    fingers  . 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 Hepatitis    age 43 , unknown type  . History of vulvar dysplasia    VIN 2  . Hypertension   . IBS (irritable bowel syndrome)   . Rectocele   . Urethral pain    mass  . Urinary incontinence   . 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 This is really VIN II and not VAIN II.   Marland Kitchen Wears contact lenses      Past Surgical History:  Procedure Laterality Date  . ABDOMINAL SACROCOLPOPEXY N/A 03/28/2016   Procedure: ABDOMINO SACROCOLPOPEXY;  Surgeon: Nunzio Cobbs, MD;  Location: [facility];  Service: Gynecology;  Laterality: N/A;  . ANTERIOR AND POSTERIOR REPAIR N/A 03/28/2016   Procedure: ANTERIOR (CYSTOCELE) AND POSTERIOR REPAIR (RECTOCELE);  Surgeon: Nunzio Cobbs, MD;  Location: [facility];  Service: Gynecology;  Laterality: N/A;  . AUGMENTATION MAMMAPLASTY  APRIL 2013   BREAST IMPLANT CHANGED  . BILATERAL SALPINGECTOMY Bilateral 03/28/2016   Procedure: BILATERAL SALPINGECTOMY;  Surgeon: Nunzio Cobbs, MD;  Location: [facility];  Service: Gynecology;  Laterality: Bilateral;  . BLADDER SUSPENSION N/A 03/28/2016   Procedure: TRANSVAGINAL TAPE (TVT) PROCEDURE exact midurethral sling;  Surgeon: Nunzio Cobbs, MD;  Location: [facility];  Service: Gynecology;  Laterality: N/A;  . COLONOSCOPY    . CYSTOSCOPY N/A 03/28/2016   Procedure: CYSTOSCOPY;  Surgeon: Nunzio Cobbs, MD;  Location: [facility];  Service: Gynecology;  Laterality: N/A;  . CYSTOSCOPY WITH BIOPSY Bilateral 11/03/2015   Procedure: CYSTOSCOPY WITH URETHRAL BIOPSY, FULGERATION, BILATERAL RETROGRADE PYELOGRAMS;  Surgeon: Alexis Frock, MD;  Location[facility];  Service: Urology;  Laterality: Bilateral;  .  LYSIS OF ADHESION N/A 03/28/2016   Procedure: LYSIS OF ADHESION;  Surgeon: Nunzio Cobbs, MD;  Location: [facility];  Service: Gynecology;  Laterality: N/A;  . MASS EXCISION N/A 11/03/2015   Procedure: EXCISION  URETHRAL MASS;  Surgeon: Alexis Frock, MD;  Location: [facility];  Service: Urology;  Laterality: N/A;  . TUBAL LIGATION    . VAGINAL HYSTERECTOMY  1999    Current Outpatient Prescriptions  Medication Sig Dispense Refill  . ALPRAZolam (XANAX) 1 MG tablet TAKE 1 TO 1 &1/2 TABLETS AT BEDTIME AS NEEDED . MAY FILL MONTHLY. 45 tablet 5  .  amLODipine-benazepril (LOTREL) 5-40 MG capsule TAKE (1) CAPSULE BY MOUTH ONCE DAILY. 30 capsule 5  . Biotin 1000 MCG tablet Take 1,000 mcg by mouth daily.    Marland Kitchen conjugated estrogens (PREMARIN) vaginal cream Place 1 Applicatorful vaginally daily. Use 1/2 g vaginally two times per week. 30 g 1  . hydrochlorothiazide (HYDRODIURIL) 25 MG tablet TAKE ONE TABLET BY MOUTH ONCE DAILY AS NEEDED FOR SWELLING. DO NOT EXCEED 3 PER WEEK. 30 tablet 5  . HYDROcodone-homatropine (HYCODAN) 5-1.5 MG/5ML syrup Take 1 teaspoon by mouth at bedtime as needed for cough 90 mL 0  . Magnesium 200 MG TABS Take 1 tablet by mouth daily.    . methylcellulose (CITRUCEL) oral powder Take 1 packet by mouth daily.     Marland Kitchen senna-docusate (SENOKOT-S) 8.6-50 MG tablet Take 1 tablet by mouth 2 (two) times daily. While taking pain meds to prevent constipation. (Patient taking differently: Take 1 tablet by mouth daily as needed for mild constipation. While taking pain meds to prevent constipation.) 30 tablet 0   No current facility-administered medications for this visit.     Family History  Problem Relation Age of Onset  . Hypertension Mother   . Osteoporosis Mother   . Paget's disease of bone Mother   . Cancer Father        MELANOMA  . Diabetes Father   . Hypertension Father   . Heart disease Father   . Cancer Sister        THYROID  . Breast cancer Paternal Grandmother   . Colon cancer Neg Hx     ROS:  Pertinent items are noted in HPI.  Otherwise, a comprehensive ROS was negative.  Exam:   BP 122/70 (BP Location: Right Arm, Patient Position: Sitting, Cuff Size: Normal)   Pulse 64   Resp 14   Ht 5' 6.5" (1.689 m)   Wt 156 lb (70.8 kg)   BMI 24.80 kg/m     General appearance: alert, cooperative and appears stated age Head: Normocephalic, without obvious abnormality, atraumatic Neck: no adenopathy, supple, symmetrical, trachea midline and thyroid normal to inspection and palpation Lungs: clear to auscultation  bilaterally Breasts: normal appearance, no masses or tenderness, No nipple retraction or dimpling, No nipple discharge or bleeding, No axillary or supraclavicular adenopathy Heart: regular rate and rhythm Abdomen: soft, non-tender; no masses, no organomegaly Extremities: extremities normal, atraumatic, no cyanosis or edema Skin: Skin color, texture, turgor normal. No rashes or lesions Lymph nodes: Cervical, supraclavicular, and axillary nodes normal. No abnormal inguinal nodes palpated Neurologic: Grossly normal  Pelvic: External genitalia:  no lesions              Urethra:  normal appearing urethra with no masses, tenderness or lesions              Bartholins and Skenes: normal  Vagina: normal appearing vagina with normal color and discharge, no lesions              Cervix:  absent              Pap taken: Yes.   Bimanual Exam:  Uterus:  Absent.               Adnexa: no mass, fullness, tenderness              Rectal exam: Yes.  .  Confirms.              Anus:  normal sphincter tone, no lesions  Chaperone was present for exam.  Assessment:   Well woman visit with normal exam. Status post TVH.  Status post abdominosacrocolpopexy and ant and post repair, TVT/cysto. Hx VIN II.  No signs of recurrence. 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 Osteopenia.   Plan: Mammogram screening discussed. Recommended self breast awareness. Pap and HR HPV done. Guidelines for Calcium, Vitamin D, regular exercise program including cardiovascular and weight bearing exercise. Refill of Premarin cream. Discussed potential increased risk of breast cancer.  BMD ordered.  Labs with PCP.  FU with urology today. Follow up annually and prn.    After visit summary provided.

## 2017-01-15 ENCOUNTER — Encounter: Payer: Self-pay | Admitting: Obstetrics and Gynecology

## 2017-01-15 ENCOUNTER — Ambulatory Visit (INDEPENDENT_AMBULATORY_CARE_PROVIDER_SITE_OTHER): Payer: BLUE CROSS/BLUE SHIELD | Admitting: Obstetrics and Gynecology

## 2017-01-15 ENCOUNTER — Other Ambulatory Visit (HOSPITAL_COMMUNITY)
Admission: RE | Admit: 2017-01-15 | Discharge: 2017-01-15 | Disposition: A | Discharge status: Home / Self Care | Payer: BLUE CROSS/BLUE SHIELD | Source: Ambulatory Visit | Attending: Obstetrics and Gynecology | Admitting: Obstetrics and Gynecology

## 2017-01-15 VITALS — BP 122/70 | HR 64 | Resp 14 | Ht 66.5 in | Wt 156.0 lb

## 2017-01-15 DIAGNOSIS — R3914 Feeling of incomplete bladder emptying: Secondary | ICD-10-CM | POA: Diagnosis not present

## 2017-01-15 DIAGNOSIS — R351 Nocturia: Secondary | ICD-10-CM | POA: Diagnosis not present

## 2017-01-15 DIAGNOSIS — M8580 Other specified disorders of bone density and structure, unspecified site: Secondary | ICD-10-CM

## 2017-01-15 DIAGNOSIS — Z01419 Encounter for gynecological examination (general) (routine) without abnormal findings: Secondary | ICD-10-CM | POA: Insufficient documentation

## 2017-01-15 DIAGNOSIS — A630 Anogenital (venereal) warts: Secondary | ICD-10-CM | POA: Diagnosis not present

## 2017-01-15 DIAGNOSIS — Z780 Asymptomatic menopausal state: Secondary | ICD-10-CM

## 2017-01-15 MED ORDER — ESTROGENS, CONJUGATED 0.625 MG/GM VA CREA: 1.0000 | TOPICAL_CREAM | Freq: Every day | VAGINAL | 1 refills | Status: DC

## 2017-01-15 NOTE — Patient Instructions (Signed)

## 2017-01-17 LAB — CYTOLOGY - PAP
Diagnosis: NEGATIVE
HPV: NOT DETECTED

## 2017-01-18 DIAGNOSIS — Z23 Encounter for immunization: Secondary | ICD-10-CM | POA: Diagnosis not present

## 2017-01-28 ENCOUNTER — Telehealth: Payer: Self-pay | Admitting: Obstetrics and Gynecology

## 2017-01-28 NOTE — Telephone Encounter (Signed)
Please contact patient in follow up to her visit to urology on 01/15/17. I did receive notes from Dr. Tresa Moore.  He will follow her for her with a recheck in one year.  If she has any symptoms of difficulty voiding such as a poor stream, urinary frequency, bladder pain, or urinary tract infections, she will need to be seen sooner. 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  The other issue is that she has had a midurethral sling, which can also be a risk factor for urinary retention.  Per Dr. Zettie Pho assessment, she does not need to do anything further at this time.

## 2017-01-29 NOTE — Telephone Encounter (Signed)
Spoke with patient, advised as seen below per Dr. Quincy Simmonds. Patient thankful for f/u and verbalizes understanding. Will close encounter.

## 2017-02-05 ENCOUNTER — Other Ambulatory Visit: Payer: BLUE CROSS/BLUE SHIELD

## 2017-02-14 ENCOUNTER — Encounter: Payer: Self-pay | Admitting: Obstetrics and Gynecology

## 2017-02-14 ENCOUNTER — Ambulatory Visit
Admission: RE | Admit: 2017-02-14 | Discharge: 2017-02-14 | Disposition: A | Discharge status: Home / Self Care | Payer: BLUE CROSS/BLUE SHIELD | Source: Ambulatory Visit | Attending: Obstetrics and Gynecology | Admitting: Obstetrics and Gynecology

## 2017-02-14 DIAGNOSIS — M85851 Other specified disorders of bone density and structure, right thigh: Secondary | ICD-10-CM | POA: Diagnosis not present

## 2017-02-14 DIAGNOSIS — Z780 Asymptomatic menopausal state: Secondary | ICD-10-CM | POA: Diagnosis not present

## 2017-02-14 DIAGNOSIS — M8580 Other specified disorders of bone density and structure, unspecified site: Secondary | ICD-10-CM

## 2017-05-01 ENCOUNTER — Telehealth: Payer: Self-pay | Admitting: *Deleted

## 2017-05-01 ENCOUNTER — Telehealth: Payer: Self-pay | Admitting: Obstetrics and Gynecology

## 2017-05-01 ENCOUNTER — Encounter: Payer: Self-pay | Admitting: Obstetrics and Gynecology

## 2017-05-01 NOTE — Telephone Encounter (Signed)
-----   Message from Shannon, Generic sent at 05/01/2017 1:23 PM EST -----    I have my prescription of Premarin that I am about to have filled , they have a generic of Estrace available now. I know you mentioned it to me in a visit . Would it be just as beneficial to me to switch to it since its a lot more inexpensive ? However if you think it best I stay with Premarin , Ill stick with Premarin . If you feel its ok to switch , could you send the new prescription to my pharmacy , [facility] in [place]?   Thanks

## 2017-05-01 NOTE — Telephone Encounter (Signed)
Closed in error, see telephone encounter dated 05/01/17.

## 2017-05-01 NOTE — Telephone Encounter (Signed)
Spoke with patient. Advised patient Dr. Quincy Simmonds on LOA, will review with covering provider and return call. Patient states she would prefer to discuss with Dr. Quincy Simmonds, will continue with Premarin as prescribed for now. Patient thankful for return call.   Routing to covering provider for final review. Patient is agreeable to disposition. Will close encounter.

## 2017-05-01 NOTE — Telephone Encounter (Signed)
Stephanie Armstrong  to Nunzio Cobbs, MD    05/01/17 1:23 PM  I have my prescription of Premarin that I am about to have filled , they have a generic of Estrace available now. I know you mentioned it to me in a visit . Would it be just as beneficial to me to switch to it since it's a lot more inexpensive ? However if you think it best I stay with Premarin , I'll stick with Premarin . If you feel it's ok to switch , could you send the new prescription to my pharmacy , [facility] in [place]?  Thanks

## 2017-05-02 NOTE — Telephone Encounter (Signed)
Spoke with patient, advised as seen below per Dr. Sabra Heck. Patient thankful for return call and recommendations, would like to keep current RX for Premarin, will discuss with Dr. Quincy Simmonds at next AEX. Advised to return call for any additional questions.   Routing to provider for final review. Patient is agreeable to disposition. Will close encounter.

## 2017-05-02 NOTE — Telephone Encounter (Signed)
You can let pt know that the generic is very little difference in cost but I know Dr. Quincy Simmonds has felt comfortable with this generic as well.  We can send in rx for pt to see about cost if she desires.

## 2017-05-09 DIAGNOSIS — L570 Actinic keratosis: Secondary | ICD-10-CM | POA: Diagnosis not present

## 2017-05-09 DIAGNOSIS — C44612 Basal cell carcinoma of skin of right upper limb, including shoulder: Secondary | ICD-10-CM | POA: Diagnosis not present

## 2017-05-09 DIAGNOSIS — L720 Epidermal cyst: Secondary | ICD-10-CM | POA: Diagnosis not present

## 2017-05-22 ENCOUNTER — Ambulatory Visit: Payer: BLUE CROSS/BLUE SHIELD | Admitting: Podiatry

## 2017-06-30 DIAGNOSIS — J209 Acute bronchitis, unspecified: Secondary | ICD-10-CM | POA: Diagnosis not present

## 2017-06-30 DIAGNOSIS — Z6825 Body mass index (BMI) 25.0-25.9, adult: Secondary | ICD-10-CM | POA: Diagnosis not present

## 2017-07-05 ENCOUNTER — Ambulatory Visit: Payer: BLUE CROSS/BLUE SHIELD | Admitting: Family Medicine

## 2017-07-05 ENCOUNTER — Encounter: Payer: Self-pay | Admitting: Family Medicine

## 2017-07-05 VITALS — BP 124/88 | Ht 66.0 in | Wt 158.0 lb

## 2017-07-05 DIAGNOSIS — F411 Generalized anxiety disorder: Secondary | ICD-10-CM

## 2017-07-05 DIAGNOSIS — F5101 Primary insomnia: Secondary | ICD-10-CM

## 2017-07-05 DIAGNOSIS — J329 Chronic sinusitis, unspecified: Secondary | ICD-10-CM

## 2017-07-05 DIAGNOSIS — I10 Essential (primary) hypertension: Secondary | ICD-10-CM | POA: Diagnosis not present

## 2017-07-05 DIAGNOSIS — J310 Chronic rhinitis: Secondary | ICD-10-CM

## 2017-07-05 MED ORDER — ALPRAZOLAM 1 MG PO TABS: ORAL_TABLET | ORAL | 5 refills | Status: DC

## 2017-07-05 MED ORDER — AMLODIPINE BESY-BENAZEPRIL HCL 5-40 MG PO CAPS: ORAL_CAPSULE | ORAL | 5 refills | Status: DC

## 2017-07-05 MED ORDER — CEFDINIR 300 MG PO CAPS: 300.0000 mg | ORAL_CAPSULE | Freq: Two times a day (BID) | ORAL | 0 refills | Status: AC

## 2017-07-05 MED ORDER — HYDROCHLOROTHIAZIDE 25 MG PO TABS
ORAL_TABLET | ORAL | 5 refills | Status: DC
Start: 2017-07-05 — End: 2018-08-08

## 2017-07-05 NOTE — Progress Notes (Signed)
   Subjective:    Patient ID: Stephanie Armstrong, female    DOB: [birth year], 62 y.o.   MRN: [id number]  HPI   Patient is here today to follow up on Htn and anxiety.   She takes Amlodipine-benazepril 5-40 one daily, she takes 1 of the xanax 1 g QHS.She eats healthy and exercises  Blood pressure medicine and blood pressure levels reviewed today with patient. Compliant with blood pressure medicine. States does not miss a dose. No obvious side effects. Blood pressure generally good when checked elsewhere. Watching salt intake.  Patient compliant with insomnia medication. Generally takes most nights. No obvious morning drowsiness. Definitely helps patient sleep. Without it patient states would not get a good nights rest.    Anxiety wise overall thigs are going good with the geernic xanax      . She does say she has had a cold for three weeks now non productive cough. Saw urgent care Saturday and they gave amoxicillin.[place] the beach [place] first of [place]  Cold and cough got to feelin grough apoagain   Had low grade fever  Placed on amox and mucinex dm      Cough overall is beter now, doton ball sensation in the throat   Trouble with irrit of thraot and headace and a littl       Review of Systems No headache, no major weight loss or weight gain, no chest pain no back pain abdominal pain no change in bowel habits complete ROS otherwise negative     Objective:   Physical Exam  Alert and oriented, vitals reviewed and stable, NAD ENT-TM's and ext canals frontal tenderness positive nasal discharge WNL bilat via otoscopic exam Soft palate, tonsils and post pharynx WNL via oropharyngeal exam Neck-symmetric, no masses; thyroid nonpalpable and nontender Pulmonary-no tachypnea or accessory muscle use; Clear without wheezes via auscultation Card--no abnrml murmurs, rhythm reg and rate WNL Carotid pulses symmetric, without bruits       Assessment & Plan:  Impression 1  rhinosinusitis/bronchitis prescribed symptom care discussed warning signs discussed  2.  Hypertension good control discussed maintain same meds  3.  Anxiety/insomnia ongoing.  Need for meds clarified to maintain same  Medications refilled.  Prescribed symptom care discussed warning signs discussed

## 2017-07-09 ENCOUNTER — Telehealth: Payer: Self-pay | Admitting: Family Medicine

## 2017-07-09 ENCOUNTER — Telehealth: Payer: Self-pay | Admitting: Obstetrics and Gynecology

## 2017-07-09 MED ORDER — BENZONATATE 100 MG PO CAPS: ORAL_CAPSULE | ORAL | 2 refills | Status: DC

## 2017-07-09 NOTE — Telephone Encounter (Signed)
Prescription sent to pharmacy. I called and left a message for the pt to r/c.

## 2017-07-09 NOTE — Telephone Encounter (Signed)
Patient is aware 

## 2017-07-09 NOTE — Telephone Encounter (Signed)
Patient is asking for an appointment with Dr.Silva. Patient states that she had prolapse surgery over a year ago and "something does not feel right".

## 2017-07-09 NOTE — Telephone Encounter (Signed)
Patient states she has a non productive cough, no fever. States she is concerned as she has had pelvic floor surgery in the past and all this coughing is putting a lot of pressure on her pelvic floor would like something called in if possible for the cough. Thanks.

## 2017-07-09 NOTE — Telephone Encounter (Signed)
Patient was seen 3/21 and states cant stop coughing and requesting something called into [facility].

## 2017-07-09 NOTE — Telephone Encounter (Signed)
Spoke with patient. ABDOMINO SACROCOLPOPEXY (N/A Abdomen); ANTERIOR (CYSTOCELE) AND POSTERIOR REPAIR (RECTOCELE) (N/A Vagina ); TRANSVAGINAL TAPE (TVT) PROCEDURE exact midurethral sling (N/A Vagina ); CYSTOSCOPY (N/A Urethra); BILATERAL SALPINGECTOMY (Bilateral Abdomen); LYSIS OF ADHESION (N/A Abdomen in 12/17. Patient states she has had a very bad cough and feels she may have "compromised" her surgery. States that "something does not feel right." Notices now with bowel movements. Denies any trouble with urination or bowel movements. No pain. Requesting an appointment for evaluation. Appointment scheduled for tomorrow at 8 am with Dr.Silva. Patient is agreeable to date and time.  Routing to provider for final review. Patient agreeable to disposition. Will close encounter.

## 2017-07-09 NOTE — Telephone Encounter (Signed)
Tess perles 100 mg 30 one q eight hrs pr cough/ two ref

## 2017-07-10 ENCOUNTER — Encounter: Payer: Self-pay | Admitting: Obstetrics and Gynecology

## 2017-07-10 ENCOUNTER — Ambulatory Visit: Payer: BLUE CROSS/BLUE SHIELD | Admitting: Obstetrics and Gynecology

## 2017-07-10 VITALS — BP 100/60 | HR 66 | Ht 66.5 in | Wt 159.0 lb

## 2017-07-10 DIAGNOSIS — K5900 Constipation, unspecified: Secondary | ICD-10-CM

## 2017-07-10 NOTE — Patient Instructions (Signed)

## 2017-07-10 NOTE — Progress Notes (Signed)
GYNECOLOGY  VISIT   HPI: 62 y.o.   Widowed  Caucasian  female   G3P3 with No LMP recorded. Patient has had a hysterectomy. here for evaluation of possible prolapse. Patient states has had  Month of coughing and feels like something has dropped in vagina area.  Feels like something has changed in the last month.   Has had significant coughing due to bronchitis, treated with abx and Tessalon Pearles. Using vaginal cream applicator, and does not feel like it is the same. Feels like something has dropped.   Saw urology in follow up to urethral mass removal, and they told her she has a little bit of retention but nothing significant.  Has some minor urgency, but not like since prior to surgery. No leak of urine with cough or sneeze.  Denies fecal incontinence.  BM only twice in the last week.  Does not usually feel like rectum is full or like it is difficult passing stool. Started Benifiber daily one day ago.  Used Senakot in the past.   Going back to the gym but not doing really heavy exercise.   GYNECOLOGIC HISTORY: No LMP recorded. Patient has had a hysterectomy. Contraception: Tubal/ Hysterectomy Menopausal hormone therapy:   Last mammogram: 11-01-16 [id number]  Last pap smear:  01-15-17 Neg:Neg Hr HPV, 10-11-15 ASCUS:Pos HR HPV        OB History    Gravida  3   Para  3   Term      Preterm      AB      Living  3     SAB      TAB      Ectopic      Multiple      Live Births                 Patient Active Problem List   Diagnosis Date Noted  . Generalized anxiety disorder 09/06/2016  . Status post surgery 03/28/2016  . Carpal tunnel syndrome 07/02/2014  . Essential hypertension, benign 01/12/2013  . 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 Easy bruisability 06/26/2012  . Menopause 06/26/2012  . Insomnia 06/26/2012    Past Medical History:  Diagnosis Date  . Anxiety   . Arthritis    fingers  . ASCUS with positive high risk human  papillomavirus of vagina dx 10/11/15  . Cancer ([place])    basal cell skin  . Hepatitis    age 63 , unknown type  . History of vulvar dysplasia    VIN 2  . Hypertension   . IBS (irritable bowel syndrome)   . Osteopenia 2018  . Rectocele   . Urethral pain    mass  . Urinary incontinence   . 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 This is really VIN II and not VAIN II.   Marland Kitchen Wears contact lenses     Past Surgical History:  Procedure Laterality Date  . ABDOMINAL SACROCOLPOPEXY N/A 03/28/2016   Procedure: ABDOMINO SACROCOLPOPEXY;  Surgeon: Nunzio Cobbs, MD;  Location: [facility];  Service: Gynecology;  Laterality: N/A;  . ANTERIOR AND POSTERIOR REPAIR N/A 03/28/2016   Procedure: ANTERIOR (CYSTOCELE) AND POSTERIOR REPAIR (RECTOCELE);  Surgeon: Nunzio Cobbs, MD;  Location: [facility];  Service: Gynecology;  Laterality: N/A;  . AUGMENTATION MAMMAPLASTY  APRIL 2013   BREAST IMPLANT CHANGED  . BILATERAL SALPINGECTOMY Bilateral 03/28/2016   Procedure: BILATERAL SALPINGECTOMY;  Surgeon: Nunzio Cobbs, MD;  Location: [facility];  Service: Gynecology;  Laterality: Bilateral;  . BLADDER SUSPENSION N/A 03/28/2016   Procedure: TRANSVAGINAL TAPE (TVT) PROCEDURE exact midurethral sling;  Surgeon: Nunzio Cobbs, MD;  Location: [facility];  Service: Gynecology;  Laterality: N/A;  . COLONOSCOPY    . CYSTOSCOPY N/A 03/28/2016   Procedure: CYSTOSCOPY;  Surgeon: Nunzio Cobbs, MD;  Location: [facility];  Service: Gynecology;  Laterality: N/A;  . CYSTOSCOPY WITH BIOPSY Bilateral 11/03/2015   Procedure: CYSTOSCOPY WITH URETHRAL BIOPSY, FULGERATION, BILATERAL RETROGRADE PYELOGRAMS;  Surgeon: Alexis Frock, MD;  Location: [facility];  Service: Urology;  Laterality: Bilateral;  . LYSIS OF ADHESION N/A 03/28/2016   Procedure: LYSIS OF ADHESION;  Surgeon: Nunzio Cobbs, MD;  Location: [facility];   Service: Gynecology;  Laterality: N/A;  . MASS EXCISION N/A 11/03/2015   Procedure: EXCISION  URETHRAL MASS;  Surgeon: Alexis Frock, MD;  Location: [facility];  Service: Urology;  Laterality: N/A;  . TUBAL LIGATION    . VAGINAL HYSTERECTOMY  1999    Current Outpatient Medications  Medication Sig Dispense Refill  . ALPRAZolam (XANAX) 1 MG tablet TAKE 1 TO 1 &1/2 TABLETS AT BEDTIME AS NEEDED . MAY FILL MONTHLY. 45 tablet 5  . amLODipine-benazepril (LOTREL) 5-40 MG capsule TAKE (1) CAPSULE BY MOUTH ONCE DAILY. 30 capsule 5  . benzonatate (TESSALON) 100 MG capsule One capsule Q 8 hours prn cough 30 capsule 2  . Biotin 1000 MCG tablet Take 1,000 mcg by mouth daily.    . cefdinir (OMNICEF) 300 MG capsule Take 1 capsule (300 mg total) by mouth 2 (two) times daily for 10 days. 20 capsule 0  . conjugated estrogens (PREMARIN) vaginal cream Place 1 Applicatorful vaginally daily. Use 1/2 g vaginally two times per week. 30 g 1  . hydrochlorothiazide (HYDRODIURIL) 25 MG tablet TAKE ONE TABLET BY MOUTH ONCE DAILY AS NEEDED FOR SWELLING. DO NOT EXCEED 3 PER WEEK. 30 tablet 5  . Magnesium 200 MG TABS Take 1 tablet by mouth daily.    Marland Kitchen senna-docusate (SENOKOT-S) 8.6-50 MG tablet Take 1 tablet by mouth 2 (two) times daily. While taking pain meds to prevent constipation. (Patient taking differently: Take 1 tablet by mouth daily as needed for mild constipation. While taking pain meds to prevent constipation.) 30 tablet 0  . Wheat Dextrin-Calcium (BENEFIBER PLUS CALCIUM PO) Take 1 tablet by mouth daily.     No current facility-administered medications for this visit.      ALLERGIES: Amoxicillin-pot clavulanate and Codeine  Family History  Problem Relation Age of Onset  . Hypertension Mother   . Osteoporosis Mother   . Paget's disease of bone Mother   . Cancer Father        MELANOMA  . Diabetes Father   . Hypertension Father   . Heart disease Father   . Cancer Sister        THYROID  .  Breast cancer Paternal Grandmother   . Colon cancer Neg Hx     Social History   Socioeconomic History  . Marital status: Widowed    Spouse name: Not on file  . Number of children: Not on file  . Years of education: Not on file  . Highest education level: Not on file  Occupational History  . Not on file  Social Needs  . Financial resource strain: Not on file  . Food insecurity:  Worry: Not on file    Inability: Not on file  . Transportation needs:    Medical: Not on file    Non-medical: Not on file  Tobacco Use  . Smoking status: Never Smoker  . Smokeless tobacco: Never Used  Substance and Sexual Activity  . Alcohol use: Yes    Alcohol/week: 6.0 - 7.2 oz    Types: 6 Glasses of wine, 4 - 6 Standard drinks or equivalent per week  . Drug use: No  . Sexual activity: Never    Partners: Male    Birth control/protection: Surgical    Comment: Tubal/TVH--ovaries remain  Lifestyle  . Physical activity:    Days per week: Not on file    Minutes per session: Not on file  . Stress: Not on file  Relationships  . Social connections:    Talks on phone: Not on file    Gets together: Not on file    Attends religious service: Not on file    Active member of club or organization: Not on file    Attends meetings of clubs or organizations: Not on file    Relationship status: Not on file  . Intimate partner violence:    Fear of current or ex partner: Not on file    Emotionally abused: Not on file    Physically abused: Not on file    Forced sexual activity: Not on file  Other Topics Concern  . Not on file  Social History Narrative  . Not on file    ROS:  Pertinent items are noted in HPI.  PHYSICAL EXAMINATION:    BP 100/60 (Patient Position: Sitting, Cuff Size: Normal)   Pulse 66   Ht 5' 6.5" (1.689 m)   Wt 159 lb (72.1 kg)   BMI 25.28 kg/m     General appearance: alert, cooperative and appears stated age  Pelvic: External genitalia:  no lesions              Urethra:   normal appearing urethra with no masses, tenderness or lesions              Bartholins and Skenes: normal                 Vagina: normal appearing vagina with normal color and discharge, no lesions.  vaginal cream noted.  Good support to anterior, apical, and posterior vaginal walls.  Rectal vault full of stool on vaginal exam.               Cervix:  Absent.                  Bimanual Exam:  Uterus:  Absent.              Adnexa: no mass, fullness, tenderness              Rectal exam: Yes.  .  Confirms.              Anus:  normal sphincter tone, no lesions  Chaperone was present for exam.  ASSESSMENT  Status post abdominal sacrocolpopexy, ant and post repair, TVT, cysto. No evidence of recurrence of prolapse.  Constipation.   PLAN  Reassurance regarding examination.  Take Benefiber daily.  Ok to take [employer] softener daily as well.  I told her it is ok to live her life and be active following the kind of surgery she had! Follow up for annual exams and prn.    An After Visit Summary  was printed and given to the patient.  _15_____ minutes face to face time of which over 50% was spent in counseling.

## 2017-07-31 DIAGNOSIS — Z85828 Personal history of other malignant neoplasm of skin: Secondary | ICD-10-CM | POA: Diagnosis not present

## 2017-07-31 DIAGNOSIS — D2262 Melanocytic nevi of left upper limb, including shoulder: Secondary | ICD-10-CM | POA: Diagnosis not present

## 2017-07-31 DIAGNOSIS — D2272 Melanocytic nevi of left lower limb, including hip: Secondary | ICD-10-CM | POA: Diagnosis not present

## 2017-07-31 DIAGNOSIS — D225 Melanocytic nevi of trunk: Secondary | ICD-10-CM | POA: Diagnosis not present

## 2017-08-10 DIAGNOSIS — L438 Other lichen planus: Secondary | ICD-10-CM | POA: Diagnosis not present

## 2017-09-19 ENCOUNTER — Telehealth: Payer: Self-pay | Admitting: Family Medicine

## 2017-09-19 ENCOUNTER — Other Ambulatory Visit: Payer: Self-pay | Admitting: Nurse Practitioner

## 2017-09-19 ENCOUNTER — Ambulatory Visit: Payer: BLUE CROSS/BLUE SHIELD | Admitting: Family Medicine

## 2017-09-19 MED ORDER — ACYCLOVIR 5 % EX OINT: 1.0000 "application " | TOPICAL_OINTMENT | CUTANEOUS | 2 refills | Status: DC

## 2017-09-19 MED ORDER — VALACYCLOVIR HCL 1 G PO TABS: ORAL_TABLET | ORAL | 5 refills | Status: DC

## 2017-09-19 NOTE — Telephone Encounter (Signed)
Called pt, notified meds called in, appt cancelled

## 2017-09-19 NOTE — Telephone Encounter (Signed)
Meds were called in as requested. She may cancel appointment if no longer needed.

## 2017-09-19 NOTE — Telephone Encounter (Signed)
Patient has a cold sore next to her nose that is flaring up.  She is requesting Rx for valtrex and cream called in.  She scheduled an appointment this afternoon just in case something couldn't be called in for her.  Larene Pickett

## 2017-10-03 ENCOUNTER — Ambulatory Visit: Payer: BLUE CROSS/BLUE SHIELD | Admitting: Nurse Practitioner

## 2017-10-03 ENCOUNTER — Encounter: Payer: Self-pay | Admitting: Nurse Practitioner

## 2017-10-03 VITALS — BP 118/88 | Temp 98.0°F | Ht 66.5 in | Wt 148.6 lb

## 2017-10-03 DIAGNOSIS — F5101 Primary insomnia: Secondary | ICD-10-CM | POA: Diagnosis not present

## 2017-10-03 DIAGNOSIS — L03011 Cellulitis of right finger: Secondary | ICD-10-CM

## 2017-10-03 DIAGNOSIS — F411 Generalized anxiety disorder: Secondary | ICD-10-CM

## 2017-10-03 MED ORDER — CEPHALEXIN 500 MG PO CAPS: 500.0000 mg | ORAL_CAPSULE | Freq: Three times a day (TID) | ORAL | 0 refills | Status: DC

## 2017-10-03 NOTE — Progress Notes (Signed)
Subjective: Presents for complaints of a mild infection on the side of the right middle finger for about the past week.  Has been applying hydrogen peroxide and warm compresses.  Has improved slightly but not totally resolved.  Had a similar problem over 10 years ago which required antibiotics.  No fever.  Also patient states she has been experiencing increased fatigue lately.  Has been dealing with transitioning her mother into assisted living which is going well at this point.  Also other family stressors.  Usually deals with them well but has had increased anxiety lately.  Takes Xanax 1 mg every night for sleep, still has some early morning awakenings around 4 AM.  Has been on Lexapro in the past which seemed to work well, concerned about possible weight gain.  Objective:   BP 118/88   Temp 98 F (36.7 C) (Oral)   Ht 5' 6.5" (1.689 m)   Wt 148 lb 9.6 oz (67.4 kg)   BMI 23.63 kg/m  NAD.  Alert, oriented.  Cheerful, mildly anxious affect.  Thoughts logical coherent and relevant.  Dressed appropriately.  Making good eye contact.  There is an opening along the cuticle on the lateral side of the right middle finger minimal erythema, mild edema slightly tender.  No active drainage.  Assessment:   Problem List Items Addressed This Visit      Other   Generalized anxiety disorder   Insomnia    Other Visit Diagnoses    Cellulitis of finger of right hand    -  Primary       Plan:   Meds ordered this encounter  Medications  . cephALEXin (KEFLEX) 500 MG capsule    Sig: Take 1 capsule (500 mg total) by mouth 3 (three) times daily.    Dispense:  21 capsule    Refill:  0    Order Specific Question:   Supervising Provider    Answer:   Mikey Kirschner [[id number]]   Lengthy discussion regarding the importance of regular activity and stress reduction.  Patient defers restarting Lexapro at this time, call back to the office if she changes her mind.  Warning signs reviewed regarding infection, call back  if worsens or persists.

## 2017-10-17 ENCOUNTER — Other Ambulatory Visit: Payer: Self-pay | Admitting: Obstetrics and Gynecology

## 2017-10-17 DIAGNOSIS — Z1231 Encounter for screening mammogram for malignant neoplasm of breast: Secondary | ICD-10-CM

## 2017-11-15 ENCOUNTER — Ambulatory Visit
Admission: RE | Admit: 2017-11-15 | Discharge: 2017-11-15 | Disposition: A | Discharge status: Home / Self Care | Payer: BLUE CROSS/BLUE SHIELD | Source: Ambulatory Visit | Attending: Obstetrics and Gynecology | Admitting: Obstetrics and Gynecology

## 2017-11-15 DIAGNOSIS — Z1231 Encounter for screening mammogram for malignant neoplasm of breast: Secondary | ICD-10-CM | POA: Diagnosis not present

## 2017-11-16 ENCOUNTER — Other Ambulatory Visit: Payer: Self-pay | Admitting: Obstetrics and Gynecology

## 2017-11-16 DIAGNOSIS — R928 Other abnormal and inconclusive findings on diagnostic imaging of breast: Secondary | ICD-10-CM

## 2017-11-21 ENCOUNTER — Ambulatory Visit
Admission: RE | Admit: 2017-11-21 | Discharge: 2017-11-21 | Disposition: A | Discharge status: Home / Self Care | Payer: BLUE CROSS/BLUE SHIELD | Source: Ambulatory Visit | Attending: Obstetrics and Gynecology | Admitting: Obstetrics and Gynecology

## 2017-11-21 ENCOUNTER — Other Ambulatory Visit: Payer: Self-pay | Admitting: Obstetrics and Gynecology

## 2017-11-21 DIAGNOSIS — R922 Inconclusive mammogram: Secondary | ICD-10-CM | POA: Diagnosis not present

## 2017-11-21 DIAGNOSIS — R928 Other abnormal and inconclusive findings on diagnostic imaging of breast: Secondary | ICD-10-CM

## 2017-11-21 DIAGNOSIS — N6489 Other specified disorders of breast: Secondary | ICD-10-CM | POA: Diagnosis not present

## 2017-11-23 ENCOUNTER — Telehealth: Payer: Self-pay | Admitting: Obstetrics and Gynecology

## 2017-11-23 NOTE — Telephone Encounter (Signed)
Spoke with patient. Reports several red, tender "spots" on external vulvar region. Skin is closed, no drainage. Only feels them when wiping, noticed today. Denies vaginal d/c, odor, pain, itching, bleeding, fever/chills.   Hx of vaginal dysplasia, requesting OV for further evaluation. OV scheduled for 8/12 at 1pm with Dr. Quincy Simmonds.    Routing to provider for final review. Patient is agreeable to disposition. Will close encounter.

## 2017-11-23 NOTE — Telephone Encounter (Signed)
Patient called and said she has some suspicious spots in her vaginal area that may need removed.

## 2017-11-23 NOTE — Progress Notes (Deleted)
GYNECOLOGY  VISIT   HPI: 62 y.o.   Widowed  Caucasian  female   G3P3 with No LMP recorded. Patient has had a hysterectomy.   here for vulvar irritation.    GYNECOLOGIC HISTORY: No LMP recorded. Patient has had a hysterectomy. Contraception: Tubal/Hysterectomy Menopausal hormone therapy:  none Last mammogram:  11/21/2017 BIRADS 1:Negative Last pap smear:   10.01.2018 negative        OB History    Gravida  3   Para  3   Term      Preterm      AB      Living  3     SAB      TAB      Ectopic      Multiple      Live Births                 Patient Active Problem List   Diagnosis Date Noted  . Generalized anxiety disorder 09/06/2016  . Status post surgery 03/28/2016  . Carpal tunnel syndrome 07/02/2014  . Essential hypertension, benign 01/12/2013  . 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 Easy bruisability 06/26/2012  . Menopause 06/26/2012  . Insomnia 06/26/2012    Past Medical History:  Diagnosis Date  . Anxiety   . Arthritis    fingers  . 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 Cancer ([place])    basal cell skin  . Hepatitis    age 68 , unknown type  . History of vulvar dysplasia    VIN 2  . Hypertension   . IBS (irritable bowel syndrome)   . Osteopenia 2018  . Rectocele   . Urethral pain    mass  . Urinary incontinence   . 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 This is really VIN II and not VAIN II.   Marland Kitchen Wears contact lenses     Past Surgical History:  Procedure Laterality Date  . ABDOMINAL SACROCOLPOPEXY N/A 03/28/2016   Procedure: ABDOMINO SACROCOLPOPEXY;  Surgeon: Nunzio Cobbs, MD;  Location: [facility];  Service: Gynecology;  Laterality: N/A;  . ANTERIOR AND POSTERIOR REPAIR N/A 03/28/2016   Procedure: ANTERIOR (CYSTOCELE) AND POSTERIOR REPAIR (RECTOCELE);  Surgeon: Nunzio Cobbs, MD;  Location: [facility];  Service:  Gynecology;  Laterality: N/A;  . AUGMENTATION MAMMAPLASTY  APRIL 2013   BREAST IMPLANT CHANGED  . BILATERAL SALPINGECTOMY Bilateral 03/28/2016   Procedure: BILATERAL SALPINGECTOMY;  Surgeon: Nunzio Cobbs, MD;  Location: [facility];  Service: Gynecology;  Laterality: Bilateral;  . BLADDER SUSPENSION N/A 03/28/2016   Procedure: TRANSVAGINAL TAPE (TVT) PROCEDURE exact midurethral sling;  Surgeon: Nunzio Cobbs, MD;  Location: [facility];  Service: Gynecology;  Laterality: N/A;  . COLONOSCOPY    . CYSTOSCOPY N/A 03/28/2016   Procedure: CYSTOSCOPY;  Surgeon: Nunzio Cobbs, MD;  Location: [facility];  Service: Gynecology;  Laterality: N/A;  . CYSTOSCOPY WITH BIOPSY Bilateral 11/03/2015   Procedure: CYSTOSCOPY WITH URETHRAL BIOPSY, FULGERATION, BILATERAL RETROGRADE PYELOGRAMS;  Surgeon: Alexis Frock, MD;  Location: [facility];  Service: Urology;  Laterality: Bilateral;  . LYSIS OF ADHESION N/A 03/28/2016   Procedure: LYSIS OF ADHESION;  Surgeon: Nunzio Cobbs, MD;  Location: [facility];  Service: Gynecology;  Laterality: N/A;  . MASS EXCISION N/A 11/03/2015   Procedure: EXCISION  URETHRAL  MASS;  Surgeon: Alexis Frock, MD;  Location: [facility];  Service: Urology;  Laterality: N/A;  . TUBAL LIGATION    . VAGINAL HYSTERECTOMY  1999    Current Outpatient Medications  Medication Sig Dispense Refill  . acyclovir ointment (ZOVIRAX) 5 % Apply 1 application topically every 3 (three) hours. Prn fever blisters (Patient not taking: Reported on 10/03/2017) 5 g 2  . ALPRAZolam (XANAX) 1 MG tablet TAKE 1 TO 1 &1/2 TABLETS AT BEDTIME AS NEEDED . MAY FILL MONTHLY. 45 tablet 5  . amLODipine-benazepril (LOTREL) 5-40 MG capsule TAKE (1) CAPSULE BY MOUTH ONCE DAILY. 30 capsule 5  . benzonatate (TESSALON) 100 MG capsule One capsule Q 8 hours prn cough (Patient not taking: Reported on 10/03/2017) 30 capsule 2  . Biotin 1000 MCG tablet Take 1,000 mcg by mouth  daily.    . cephALEXin (KEFLEX) 500 MG capsule Take 1 capsule (500 mg total) by mouth 3 (three) times daily. 21 capsule 0  . conjugated estrogens (PREMARIN) vaginal cream Place 1 Applicatorful vaginally daily. Use 1/2 g vaginally two times per week. 30 g 1  . hydrochlorothiazide (HYDRODIURIL) 25 MG tablet TAKE ONE TABLET BY MOUTH ONCE DAILY AS NEEDED FOR SWELLING. DO NOT EXCEED 3 PER WEEK. 30 tablet 5  . Magnesium 200 MG TABS Take 1 tablet by mouth daily.    Marland Kitchen senna-docusate (SENOKOT-S) 8.6-50 MG tablet Take 1 tablet by mouth 2 (two) times daily. While taking pain meds to prevent constipation. (Patient taking differently: Take 1 tablet by mouth daily as needed for mild constipation. While taking pain meds to prevent constipation.) 30 tablet 0  . valACYclovir (VALTREX) 1000 MG tablet Take 2 pills po at onset of fever blister; repeat once in 12 hours (Patient not taking: Reported on 10/03/2017) 4 tablet 5  . Wheat Dextrin-Calcium (BENEFIBER PLUS CALCIUM PO) Take 1 tablet by mouth daily.     No current facility-administered medications for this visit.      ALLERGIES: Amoxicillin-pot clavulanate and Codeine  Family History  Problem Relation Age of Onset  . Hypertension Mother   . Osteoporosis Mother   . Paget's disease of bone Mother   . Cancer Father        MELANOMA  . Diabetes Father   . Hypertension Father   . Heart disease Father   . Cancer Sister        THYROID  . Breast cancer Paternal Grandmother   . Colon cancer Neg Hx     Social History   Socioeconomic History  . Marital status: Widowed    Spouse name: Not on file  . Number of children: Not on file  . Years of education: Not on file  . Highest education level: Not on file  Occupational History  . Not on file  Social Needs  . Financial resource strain: Not on file  . Food insecurity:    Worry: Not on file    Inability: Not on file  . Transportation needs:    Medical: Not on file    Non-medical: Not on file  Tobacco  Use  . Smoking status: Never Smoker  . Smokeless tobacco: Never Used  Substance and Sexual Activity  . Alcohol use: Yes    Alcohol/week: 10.0 - 12.0 standard drinks    Types: 6 Glasses of wine, 4 - 6 Standard drinks or equivalent per week  . Drug use: No  . Sexual activity: Never    Partners: Male    Birth control/protection: Surgical  Comment: Tubal/TVH--ovaries remain  Lifestyle  . Physical activity:    Days per week: Not on file    Minutes per session: Not on file  . Stress: Not on file  Relationships  . Social connections:    Talks on phone: Not on file    Gets together: Not on file    Attends religious service: Not on file    Active member of club or organization: Not on file    Attends meetings of clubs or organizations: Not on file    Relationship status: Not on file  . Intimate partner violence:    Fear of current or ex partner: Not on file    Emotionally abused: Not on file    Physically abused: Not on file    Forced sexual activity: Not on file  Other Topics Concern  . Not on file  Social History Narrative  . Not on file    Review of Systems  PHYSICAL EXAMINATION:    There were no vitals taken for this visit.    General appearance: alert, cooperative and appears stated age Head: Normocephalic, without obvious abnormality, atraumatic Neck: no adenopathy, supple, symmetrical, trachea midline and thyroid normal to inspection and palpation Lungs: clear to auscultation bilaterally Breasts: normal appearance, no masses or tenderness, No nipple retraction or dimpling, No nipple discharge or bleeding, No axillary or supraclavicular adenopathy Heart: regular rate and rhythm Abdomen: soft, non-tender, no masses,  no organomegaly Extremities: extremities normal, atraumatic, no cyanosis or edema Skin: Skin color, texture, turgor normal. No rashes or lesions Lymph nodes: Cervical, supraclavicular, and axillary nodes normal. No abnormal inguinal nodes  palpated Neurologic: Grossly normal  Pelvic: External genitalia:  no lesions              Urethra:  normal appearing urethra with no masses, tenderness or lesions              Bartholins and Skenes: normal                 Vagina: normal appearing vagina with normal color and discharge, no lesions              Cervix: no lesions                Bimanual Exam:  Uterus:  normal size, contour, position, consistency, mobility, non-tender              Adnexa: no mass, fullness, tenderness              Rectal exam: {yes no:314532}.  Confirms.              Anus:  normal sphincter tone, no lesions  Chaperone was present for exam.  ASSESSMENT     PLAN     An After Visit Summary was printed and given to the patient.  ______ minutes face to face time of which over 50% was spent in counseling.

## 2017-11-26 ENCOUNTER — Telehealth: Payer: Self-pay | Admitting: Obstetrics and Gynecology

## 2017-11-26 ENCOUNTER — Ambulatory Visit: Payer: Self-pay | Admitting: Obstetrics and Gynecology

## 2017-11-26 NOTE — Telephone Encounter (Signed)
I have read this update.  Encounter closed.

## 2017-11-26 NOTE — Telephone Encounter (Signed)
Patient cancelled her appointment today because her problem has resolved.

## 2017-11-30 ENCOUNTER — Telehealth: Payer: Self-pay | Admitting: Family Medicine

## 2017-11-30 DIAGNOSIS — R5383 Other fatigue: Secondary | ICD-10-CM

## 2017-11-30 DIAGNOSIS — Z13220 Encounter for screening for lipoid disorders: Secondary | ICD-10-CM

## 2017-11-30 DIAGNOSIS — I10 Essential (primary) hypertension: Secondary | ICD-10-CM

## 2017-11-30 DIAGNOSIS — Z79899 Other long term (current) drug therapy: Secondary | ICD-10-CM

## 2017-11-30 NOTE — Telephone Encounter (Signed)
Has labs drawn last on 07/08/2016 Tsh,Bmet,Hepatic,Lipid. Please advise.

## 2017-11-30 NOTE — Telephone Encounter (Signed)
Patient has 6 month follow up in Sept 10 and was wondering if she needs labs done.

## 2017-12-03 NOTE — Telephone Encounter (Signed)
Blood work ordered in Epic. Patient notified. 

## 2017-12-03 NOTE — Telephone Encounter (Signed)
Rep same plus cbc 

## 2017-12-07 ENCOUNTER — Other Ambulatory Visit: Payer: Self-pay | Admitting: Obstetrics and Gynecology

## 2017-12-07 NOTE — Telephone Encounter (Signed)
Medication refill request: Premarin Last AEX:  01/15/17 BS Next AEX: 01/30/18 Last MMG (if hormonal medication request): 11/15/17 BIRADS 0:Incomplete; 11/21/17 Left Breast MM/US - BIRADS 1 negative/density c Refill authorized: 01/15/17 #30g w/1 refills; today please advise

## 2017-12-12 DIAGNOSIS — Z79899 Other long term (current) drug therapy: Secondary | ICD-10-CM | POA: Diagnosis not present

## 2017-12-12 DIAGNOSIS — Z13220 Encounter for screening for lipoid disorders: Secondary | ICD-10-CM | POA: Diagnosis not present

## 2017-12-12 DIAGNOSIS — R5383 Other fatigue: Secondary | ICD-10-CM | POA: Diagnosis not present

## 2017-12-12 DIAGNOSIS — I10 Essential (primary) hypertension: Secondary | ICD-10-CM | POA: Diagnosis not present

## 2017-12-13 LAB — CBC WITH DIFFERENTIAL/PLATELET
Basophils Absolute: 0 10*3/uL (ref 0.0–0.2)
Basos: 0 %
EOS (ABSOLUTE): 0.2 10*3/uL (ref 0.0–0.4)
Eos: 3 %
Hematocrit: 43 % (ref 34.0–46.6)
Hemoglobin: 14.4 g/dL (ref 11.1–15.9)
Immature Grans (Abs): 0 10*3/uL (ref 0.0–0.1)
Immature Granulocytes: 0 %
Lymphocytes Absolute: 1.7 10*3/uL (ref 0.7–3.1)
Lymphs: 31 %
MCH: 31.5 pg (ref 26.6–33.0)
MCHC: 33.5 g/dL (ref 31.5–35.7)
MCV: 94 fL (ref 79–97)
Monocytes Absolute: 0.5 10*3/uL (ref 0.1–0.9)
Monocytes: 9 %
Neutrophils Absolute: 3 10*3/uL (ref 1.4–7.0)
Neutrophils: 57 %
Platelets: 268 10*3/uL (ref 150–450)
RBC: 4.57 x10E6/uL (ref 3.77–5.28)
RDW: 13.3 % (ref 12.3–15.4)
WBC: 5.4 10*3/uL (ref 3.4–10.8)

## 2017-12-13 LAB — LIPID PANEL
Chol/HDL Ratio: 2.3 {ratio} (ref 0.0–4.4)
Cholesterol, Total: 195 mg/dL (ref 100–199)
HDL: 84 mg/dL (ref >39)
LDL Calculated: 99 mg/dL (ref 0–99)
Triglycerides: 60 mg/dL (ref 0–149)
VLDL Cholesterol Cal: 12 mg/dL (ref 5–40)

## 2017-12-13 LAB — HEPATIC FUNCTION PANEL
ALT: 12 [IU]/L (ref 0–32)
AST: 19 [IU]/L (ref 0–40)
Albumin: 4.5 g/dL (ref 3.6–4.8)
Alkaline Phosphatase: 58 [IU]/L (ref 39–117)
Bilirubin Total: 0.4 mg/dL (ref 0.0–1.2)
Bilirubin, Direct: 0.1 mg/dL (ref 0.00–0.40)
Total Protein: 6.5 g/dL (ref 6.0–8.5)

## 2017-12-13 LAB — BASIC METABOLIC PANEL
BUN/Creatinine Ratio: 29 — ABNORMAL HIGH (ref 12–28)
BUN: 26 mg/dL (ref 8–27)
CO2: 23 mmol/L (ref 20–29)
Calcium: 9.7 mg/dL (ref 8.7–10.3)
Chloride: 104 mmol/L (ref 96–106)
Creatinine, Ser: 0.91 mg/dL (ref 0.57–1.00)
GFR calc Af Amer: 78 mL/min/{1.73_m2} (ref >59)
GFR calc non Af Amer: 68 mL/min/{1.73_m2} (ref >59)
Glucose: 93 mg/dL (ref 65–99)
Potassium: 4.5 mmol/L (ref 3.5–5.2)
Sodium: 140 mmol/L (ref 134–144)

## 2017-12-13 LAB — TSH: TSH: 1.11 u[IU]/mL (ref 0.450–4.500)

## 2017-12-25 ENCOUNTER — Ambulatory Visit: Payer: BLUE CROSS/BLUE SHIELD | Admitting: Family Medicine

## 2017-12-25 ENCOUNTER — Encounter: Payer: Self-pay | Admitting: Family Medicine

## 2017-12-25 VITALS — BP 122/82 | Ht 65.5 in | Wt 148.0 lb

## 2017-12-25 DIAGNOSIS — I10 Essential (primary) hypertension: Secondary | ICD-10-CM | POA: Diagnosis not present

## 2017-12-25 DIAGNOSIS — F411 Generalized anxiety disorder: Secondary | ICD-10-CM | POA: Diagnosis not present

## 2017-12-25 DIAGNOSIS — R5383 Other fatigue: Secondary | ICD-10-CM | POA: Diagnosis not present

## 2017-12-25 MED ORDER — ALPRAZOLAM 1 MG PO TABS: ORAL_TABLET | ORAL | 5 refills | Status: DC

## 2017-12-25 MED ORDER — BUPROPION HCL ER (SR) 150 MG PO TB12: ORAL_TABLET | ORAL | 5 refills | Status: DC

## 2017-12-25 MED ORDER — AMLODIPINE BESY-BENAZEPRIL HCL 5-40 MG PO CAPS: ORAL_CAPSULE | ORAL | 5 refills | Status: DC

## 2017-12-25 NOTE — Progress Notes (Signed)
Subjective:    Patient ID: Stephanie Armstrong, female    DOB: 1955-06-10, 62 y.o.   MRN: [id number]  Hypertension  This is a chronic problem. The current episode started more than 1 year ago. Risk factors for coronary artery disease include post-menopausal state. Treatments tried: lotrel. There are no compliance problems.    Discuss recent lab results. Mom is not wat [facility], been at the hosp twice in the last forty days  Has a fib and other chLLENGES WITH CHF  Results for orders placed or performed in visit on 11/30/17  Lipid panel  Result Value Ref Range   Cholesterol, Total 195 100 - 199 mg/dL   Triglycerides 60 0 - 149 mg/dL   HDL 84 >39 mg/dL   VLDL Cholesterol Cal 12 5 - 40 mg/dL   LDL Calculated 99 0 - 99 mg/dL   Chol/HDL Ratio 2.3 0.0 - 4.4 ratio  Hepatic function panel  Result Value Ref Range   Total Protein 6.5 6.0 - 8.5 g/dL   Albumin 4.5 3.6 - 4.8 g/dL   Bilirubin Total 0.4 0.0 - 1.2 mg/dL   Bilirubin, Direct 0.10 0.00 - 0.40 mg/dL   Alkaline Phosphatase 58 39 - 117 IU/L   AST 19 0 - 40 IU/L   ALT 12 0 - 32 IU/L  CBC with Differential/Platelet  Result Value Ref Range   WBC 5.4 3.4 - 10.8 x10E3/uL   RBC 4.57 3.77 - 5.28 x10E6/uL   Hemoglobin 14.4 11.1 - 15.9 g/dL   Hematocrit 43.0 34.0 - 46.6 %   MCV 94 79 - 97 fL   MCH 31.5 26.6 - 33.0 pg   MCHC 33.5 31.5 - 35.7 g/dL   RDW 13.3 12.3 - 15.4 %   Platelets 268 150 - 450 x10E3/uL   Neutrophils 57 Not Estab. %   Lymphs 31 Not Estab. %   Monocytes 9 Not Estab. %   Eos 3 Not Estab. %   Basos 0 Not Estab. %   Neutrophils Absolute 3.0 1.4 - 7.0 x10E3/uL   Lymphocytes Absolute 1.7 0.7 - 3.1 x10E3/uL   Monocytes Absolute 0.5 0.1 - 0.9 x10E3/uL   EOS (ABSOLUTE) 0.2 0.0 - 0.4 x10E3/uL   Basophils Absolute 0.0 0.0 - 0.2 x10E3/uL   Immature Granulocytes 0 Not Estab. %   Immature Grans (Abs) 0.0 0.0 - 0.1 T03T4/SF  Basic metabolic panel  Result Value Ref Range   Glucose 93 65 - 99 mg/dL   BUN 26 8 - 27 mg/dL   Creatinine, Ser 0.91 0.57 - 1.00 mg/dL   GFR calc non Af Amer 68 >59 mL/min/1.73   GFR calc Af Amer 78 >59 mL/min/1.73   BUN/Creatinine Ratio 29 (H) 12 - 28   Sodium 140 134 - 144 mmol/L   Potassium 4.5 3.5 - 5.2 mmol/L   Chloride 104 96 - 106 mmol/L   CO2 23 20 - 29 mmol/L   Calcium 9.7 8.7 - 10.3 mg/dL  TSH  Result Value Ref Range   TSH 1.110 0.450 - 4.500 uIU/mL   Having progressive difficulties with stress.  Mother is sick.  Causing anxiety.  Even causing an element of depression.  Does not want to go on Lexapro because of history of weakness in the past.  Blood pressure medicine and blood pressure levels reviewed today with patient. Compliant with blood pressure medicine. States does not miss a dose. No obvious side effects. Blood pressure generally good when checked elsewhere. Watching salt intake.   Still exercising  fairly regularly  Review of Systems No headache, no major weight loss or weight gain, no chest pain no back pain abdominal pain no change in bowel habits complete ROS otherwise negative     Objective:   Physical Exam   Alert and oriented, vitals reviewed and stable, NAD ENT-TM's and ext canals WNL bilat via otoscopic exam Soft palate, tonsils and post pharynx WNL via oropharyngeal exam Neck-symmetric, no masses; thyroid nonpalpable and nontender Pulmonary-no tachypnea or accessory muscle use; Clear without wheezes via auscultation Card--no abnrml murmurs, rhythm reg and rate WNL Carotid pulses symmetric, without bruits      Assessment & Plan:  Impression testing hypertension discussed.  Patient to maintain same dose.  Compliance discussed substantial rest with  2.  Worsening anxiety and element of depression.  Discussed the potential side effects and benefits of medication.  Patient agreed to utilize Wellbutrin.  Will initiate.  Greater than 50% of this 25 minute face to face visit was spent in counseling and discussion and coordination of care regarding  the above diagnosis/diagnosies

## 2018-01-07 ENCOUNTER — Ambulatory Visit: Payer: BLUE CROSS/BLUE SHIELD | Admitting: Family Medicine

## 2018-01-07 ENCOUNTER — Encounter: Payer: Self-pay | Admitting: Family Medicine

## 2018-01-07 VITALS — BP 142/92 | Temp 97.5°F | Ht 65.5 in | Wt 148.6 lb

## 2018-01-07 DIAGNOSIS — R197 Diarrhea, unspecified: Secondary | ICD-10-CM

## 2018-01-07 MED ORDER — HYOSCYAMINE SULFATE ER 0.375 MG PO TB12: 0.3750 mg | ORAL_TABLET | Freq: Two times a day (BID) | ORAL | 0 refills | Status: DC

## 2018-01-07 NOTE — Progress Notes (Signed)
   Subjective:    Patient ID: Stephanie Armstrong, female    DOB: [birth year], 62 y.o.   MRN: [id number]  Diarrhea   This is a new problem. The current episode started 1 to 4 weeks ago. Episode frequency: more at night. The patient states that diarrhea awakens her from sleep. She has tried change of diet (stopped WellButrin ) for the symptoms.    Loose stools aorun the time of taking wellbutrin,  Pt having diarrhea pretty much since then  No reg b m since then   pt stopped the wellbutrin  Now off the well vsince the 18th  Ongoing vhallenges with diarrhea , just about every morning  Waking up with loose stools   Feels gurgly and loose    severakl thru out the day, goes three times, occas tensmus        Review of Systems  Gastrointestinal: Positive for diarrhea.       Objective:   Physical Exam  Alert vitals stable, NAD. Blood pressure good on repeat. HEENT normal. Lungs clear. Heart regular rate and rhythm. Hyperactive bowel sounds diffusely no discrete tenderness      Assessment & Plan:  Impression persistent diarrhea.  Post initiating Wellbutrin.  May use Imodium.  We will add Wellbutrin for element of spasm does also have irritable bowel syndrome type history.  Patient wishes to hold off on all psychoactive medicines for now.  Does not want to be on serotonin ability with weight gain tendency

## 2018-01-30 ENCOUNTER — Ambulatory Visit: Payer: BLUE CROSS/BLUE SHIELD | Admitting: Obstetrics and Gynecology

## 2018-01-30 ENCOUNTER — Other Ambulatory Visit (HOSPITAL_COMMUNITY)
Admission: RE | Admit: 2018-01-30 | Discharge: 2018-01-30 | Disposition: A | Discharge status: Home / Self Care | Payer: BLUE CROSS/BLUE SHIELD | Source: Ambulatory Visit | Attending: Obstetrics and Gynecology | Admitting: Obstetrics and Gynecology

## 2018-01-30 ENCOUNTER — Encounter: Payer: Self-pay | Admitting: Obstetrics and Gynecology

## 2018-01-30 ENCOUNTER — Other Ambulatory Visit: Payer: Self-pay

## 2018-01-30 VITALS — BP 120/64 | HR 60 | Resp 18 | Ht 66.0 in | Wt 150.0 lb

## 2018-01-30 DIAGNOSIS — Z01419 Encounter for gynecological examination (general) (routine) without abnormal findings: Secondary | ICD-10-CM

## 2018-01-30 NOTE — Progress Notes (Signed)
62 y.o. G3P3 Widowed Caucasian female here for annual exam.    No bladder problems.  Slower voiding and sometimes has double voiding.  If she has urgency, has difficulty waiting to void.  No urge incontinence.   Bowel function alternates from constipation to loose stool. Takes probiotics and uses digestive supplement.    Does not need refill of Premarin vaginal cream.   Mother is in assisted living.  PCP: Baltazar Apo, MD  No LMP recorded. Patient has had a hysterectomy.           Sexually active: No.  The current method of family planning is tubal ligation.    Exercising: Yes.    walking and weights Smoker:  no  Health Maintenance: Pap:  01-15-17 Neg:Neg, 10-11-15 ASCUS:Pos HR HPV History of abnormal Pap:  Yes, , 10-11-15 ASCUS:Pos HR HPV--Colposcopy of vagina and vulva on 10/11/15--Bx of right and left vaginal apices showed no atypia or dysplasia. Biopsies of the right and left labia majora also negative for atypia and dysplasia. (Hx of prior VIN II dx at outside office.) MMG: 11-15-17 Implants/poss.asymmetry Lt.Br.,Rt.Neg./Density C/Lt.Br.U/S reveals overlapping fibroglandular tissue/screening 35yr/BiRads1 Colonoscopy:  08-29-12 normal with Dr.Perry;next due 08/2022. BMD:  02-14-17 Result :Osteopenia hip and spine TDaP:  PCP Gardasil:   no HIV:no Hep C:no Screening Labs:  Hb today: PCP    reports that she has never smoked. She has never used smokeless tobacco. She reports that she drinks about 14.0 - 16.0 standard drinks of alcohol per week. She reports that she does not use drugs.  Past Medical History:  Diagnosis Date  . Anxiety   . Arthritis    fingers  . 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 Hepatitis    age 71 , unknown type  . History of vulvar dysplasia    VIN 2  . Hypertension   . IBS (irritable bowel syndrome)   . Osteopenia 2018  . Rectocele   . Urethral pain    mass  . Urinary  incontinence   . 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 This is really VIN II and not VAIN II.   Marland Kitchen Wears contact lenses     Past Surgical History:  Procedure Laterality Date  . ABDOMINAL SACROCOLPOPEXY N/A 03/28/2016   Procedure: ABDOMINO SACROCOLPOPEXY;  Surgeon: Nunzio Cobbs, MD;  Location: [facility];  Service: Gynecology;  Laterality: N/A;  . ANTERIOR AND POSTERIOR REPAIR N/A 03/28/2016   Procedure: ANTERIOR (CYSTOCELE) AND POSTERIOR REPAIR (RECTOCELE);  Surgeon: Nunzio Cobbs, MD;  Location: [facility];  Service: Gynecology;  Laterality: N/A;  . AUGMENTATION MAMMAPLASTY  APRIL 2013   BREAST IMPLANT CHANGED  . BILATERAL SALPINGECTOMY Bilateral 03/28/2016   Procedure: BILATERAL SALPINGECTOMY;  Surgeon: Nunzio Cobbs, MD;  Location: [facility];  Service: Gynecology;  Laterality: Bilateral;  . BLADDER SUSPENSION N/A 03/28/2016   Procedure: TRANSVAGINAL TAPE (TVT) PROCEDURE exact midurethral sling;  Surgeon: Nunzio Cobbs, MD;  Location: [facility];  Service: Gynecology;  Laterality: N/A;  . COLONOSCOPY    . CYSTOSCOPY N/A 03/28/2016   Procedure: CYSTOSCOPY;  Surgeon: Nunzio Cobbs, MD;  Location: [facility];  Service: Gynecology;  Laterality: N/A;  . CYSTOSCOPY WITH BIOPSY Bilateral 11/03/2015   Procedure: CYSTOSCOPY WITH URETHRAL BIOPSY, FULGERATION, BILATERAL RETROGRADE PYELOGRAMS;  Surgeon: Alexis Frock, MD;  Location: [facility];  Service: Urology;  Laterality: Bilateral;  . LYSIS OF ADHESION N/A 03/28/2016   Procedure: LYSIS OF ADHESION;  Surgeon: Nunzio Cobbs, MD;  Location: [facility];  Service: Gynecology;  Laterality: N/A;  . MASS EXCISION N/A 11/03/2015   Procedure: EXCISION  URETHRAL MASS;  Surgeon: Alexis Frock, MD;  Location: [facility];  Service: Urology;  Laterality: N/A;  . TUBAL LIGATION    . VAGINAL HYSTERECTOMY  1999    Current  Outpatient Medications  Medication Sig Dispense Refill  . acyclovir ointment (ZOVIRAX) 5 % Apply 1 application topically every 3 (three) hours. Prn fever blisters 5 g 2  . ALPRAZolam (XANAX) 1 MG tablet TAKE 1 TO 1 &1/2 TABLETS AT BEDTIME AS NEEDED . MAY FILL MONTHLY. 45 tablet 5  . amLODipine-benazepril (LOTREL) 5-40 MG capsule TAKE (1) CAPSULE BY MOUTH ONCE DAILY. 30 capsule 5  . Biotin 1000 MCG tablet Take 1,000 mcg by mouth daily.    . hydrochlorothiazide (HYDRODIURIL) 25 MG tablet TAKE ONE TABLET BY MOUTH ONCE DAILY AS NEEDED FOR SWELLING. DO NOT EXCEED 3 PER WEEK. 30 tablet 5  . hyoscyamine (LEVBID) 0.375 MG 12 hr tablet Take 1 tablet (0.375 mg total) by mouth 2 (two) times daily. (Patient taking differently: Take 0.375 mg by mouth as needed. ) 30 tablet 0  . Magnesium 200 MG TABS Take 1 tablet by mouth daily.    Marland Kitchen PREMARIN vaginal cream INSERT 1/2 GRAM VAGINALLY TWICE WEEKLY. 30 g 0  . senna-docusate (SENOKOT-S) 8.6-50 MG tablet Take 1 tablet by mouth 2 (two) times daily. While taking pain meds to prevent constipation. (Patient taking differently: Take 1 tablet by mouth daily as needed for mild constipation. While taking pain meds to prevent constipation.) 30 tablet 0  . valACYclovir (VALTREX) 1000 MG tablet Take 2 pills po at onset of fever blister; repeat once in 12 hours 4 tablet 5  . Wheat Dextrin-Calcium (BENEFIBER PLUS CALCIUM PO) Take 1 tablet by mouth daily.     No current facility-administered medications for this visit.     Family History  Problem Relation Age of Onset  . Hypertension Mother   . Osteoporosis Mother   . Paget's disease of bone Mother   . Cancer Father        MELANOMA  . Diabetes Father   . Hypertension Father   . Heart disease Father   . Cancer Sister        THYROID  . Breast cancer Paternal Grandmother   . Colon cancer Neg Hx     Review of Systems  All other systems reviewed and are negative.   Exam:   BP 120/64 (BP Location: Right Arm, Patient  Position: Sitting, Cuff Size: Normal)   Pulse 60   Resp 18   Ht 5\' 6"  (1.676 m)   Wt 150 lb (68 kg)   BMI 24.21 kg/m     General appearance: alert, cooperative and appears stated age Head: Normocephalic, without obvious abnormality, atraumatic Neck: no adenopathy, supple, symmetrical, trachea midline and thyroid normal to inspection and palpation Lungs: clear to auscultation bilaterally Breasts:  Scars consistent with prior augmentation surgery, no masses or tenderness, No nipple retraction or dimpling, No nipple discharge or bleeding, No axillary or supraclavicular adenopathy Heart: regular rate and rhythm Abdomen: soft, non-tender; no masses, no organomegaly Extremities: extremities normal, atraumatic, no cyanosis or edema Skin: Skin color, texture, turgor normal. No rashes or lesions Lymph nodes: Cervical, supraclavicular, and  axillary nodes normal. No abnormal inguinal nodes palpated Neurologic: Grossly normal  Pelvic: External genitalia:  no lesions              Urethra:  normal appearing urethra with no masses, tenderness or lesions              Bartholins and Skenes: normal                 Vagina: normal appearing vagina with normal color and discharge, no lesions              Cervix:  absent              Pap taken: Yes.   Bimanual Exam:  Uterus:   absent              Adnexa: no mass, fullness, tenderness              Rectal exam: Yes.  .  Confirms.              Anus:  normal sphincter tone, no lesions  Chaperone was present for exam.  Assessment:   Well woman visit with normal exam. Status post TVH.  Status post abdominosacrocolpopexy and ant and post repair, TVT/cysto.  Good support. Hx VIN II.  No signs of recurrence. 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 Osteopenia.   Plan: Mammogram screening. Recommended self breast awareness. Pap and HR HPV as above. Guidelines for Calcium, Vitamin D, regular exercise  program including cardiovascular and weight bearing exercise. BMD next year. Labs with PCP.   Follow up annually and prn.   After visit summary provided.

## 2018-01-30 NOTE — Patient Instructions (Signed)

## 2018-02-04 LAB — CYTOLOGY - PAP
Diagnosis: NEGATIVE
HPV: NOT DETECTED

## 2018-02-14 DIAGNOSIS — A630 Anogenital (venereal) warts: Secondary | ICD-10-CM | POA: Diagnosis not present

## 2018-02-14 DIAGNOSIS — R3914 Feeling of incomplete bladder emptying: Secondary | ICD-10-CM | POA: Diagnosis not present

## 2018-03-07 DIAGNOSIS — Z029 Encounter for administrative examinations, unspecified: Secondary | ICD-10-CM

## 2018-07-08 ENCOUNTER — Other Ambulatory Visit: Payer: Self-pay | Admitting: Family Medicine

## 2018-08-05 ENCOUNTER — Other Ambulatory Visit: Payer: Self-pay | Admitting: Family Medicine

## 2018-08-05 NOTE — Telephone Encounter (Signed)
Past time for six mo visit, sched virtualm then may fil times one

## 2018-08-08 ENCOUNTER — Other Ambulatory Visit: Payer: Self-pay

## 2018-08-08 ENCOUNTER — Ambulatory Visit (INDEPENDENT_AMBULATORY_CARE_PROVIDER_SITE_OTHER): Payer: BLUE CROSS/BLUE SHIELD | Admitting: Family Medicine

## 2018-08-08 ENCOUNTER — Encounter: Payer: Self-pay | Admitting: Family Medicine

## 2018-08-08 VITALS — Wt 153.0 lb

## 2018-08-08 DIAGNOSIS — I10 Essential (primary) hypertension: Secondary | ICD-10-CM

## 2018-08-08 MED ORDER — HYDROCHLOROTHIAZIDE 25 MG PO TABS: ORAL_TABLET | ORAL | 5 refills | Status: DC

## 2018-08-08 MED ORDER — ALPRAZOLAM 1 MG PO TABS: ORAL_TABLET | ORAL | 5 refills | Status: DC

## 2018-08-08 MED ORDER — AMLODIPINE BESY-BENAZEPRIL HCL 5-40 MG PO CAPS: ORAL_CAPSULE | ORAL | 5 refills | Status: DC

## 2018-08-08 MED ORDER — HYOSCYAMINE SULFATE ER 0.375 MG PO TB12: 0.3750 mg | ORAL_TABLET | ORAL | 5 refills | Status: DC | PRN

## 2018-08-08 MED ORDER — ALPRAZOLAM 1 MG PO TABS: ORAL_TABLET | ORAL | 0 refills | Status: DC

## 2018-08-08 NOTE — Progress Notes (Signed)
   Subjective:    Patient ID: Stephanie Armstrong, female    DOB: 01-25-1956, 63 y.o.   MRN: [id number] Audio plus visual HPI Pt is needing refills on chronic medications. Pt states she is not having any issues with medication. No issues with blood pressure. Pt states she took her BP this morning and it was 123/68. Pt takes BP pill at bedtime.  Virtual Visit via Video Note  I connected with Stephanie Armstrong on 08/08/18 at 11:00 AM EDT by a video enabled telemedicine application and verified that I am speaking with the correct person using two identifiers.   I discussed the limitations of evaluation and management by telemedicine and the availability of in person appointments. The patient expressed understanding and agreed to proceed.  History of Present Illness:    Observations/Objective:   Assessment and Plan:   Follow Up Instructions:    I discussed the assessment and treatment plan with the patient. The patient was provided an opportunity to ask questions and all were answered. The patient agreed with the plan and demonstrated an understanding of the instructions.   The patient was advised to call back or seek an in-person evaluation if the symptoms worsen or if the condition fails to improve as anticipated.  I provided 20 minutes of non-face-to-face time during this encounter.   Vicente Males, LPN  Blood pressure medicine and blood pressure levels reviewed today with patient. Compliant with blood pressure medicine. States does not miss a dose. No obvious side effects. Blood pressure generally good when checked elsewhere. Watching salt intake.   Patient notes her anxiety overall is fairly stable.  Still taking her medicines.  But overall in good control.  Review of Systems No headache, no major weight loss or weight gain, no chest pain no back pain abdominal pain no change in bowel habits complete ROS otherwise negative     Objective:   Physical Exam   Virtual visit      Assessment & Plan:  Impression hypertension good control discussed maintain same meds  2.  Chronic anxiety.  Clinically stable.  Exercise discussed.

## 2018-08-14 DIAGNOSIS — L578 Other skin changes due to chronic exposure to nonionizing radiation: Secondary | ICD-10-CM | POA: Diagnosis not present

## 2018-08-14 DIAGNOSIS — D225 Melanocytic nevi of trunk: Secondary | ICD-10-CM | POA: Diagnosis not present

## 2018-08-14 DIAGNOSIS — Z85828 Personal history of other malignant neoplasm of skin: Secondary | ICD-10-CM | POA: Diagnosis not present

## 2018-08-14 DIAGNOSIS — L570 Actinic keratosis: Secondary | ICD-10-CM | POA: Diagnosis not present

## 2018-08-14 DIAGNOSIS — D1801 Hemangioma of skin and subcutaneous tissue: Secondary | ICD-10-CM | POA: Diagnosis not present

## 2018-10-15 DIAGNOSIS — S134XXA Sprain of ligaments of cervical spine, initial encounter: Secondary | ICD-10-CM | POA: Diagnosis not present

## 2018-10-15 DIAGNOSIS — S233XXA Sprain of ligaments of thoracic spine, initial encounter: Secondary | ICD-10-CM | POA: Diagnosis not present

## 2018-10-15 DIAGNOSIS — S338XXA Sprain of other parts of lumbar spine and pelvis, initial encounter: Secondary | ICD-10-CM | POA: Diagnosis not present

## 2018-10-17 DIAGNOSIS — S134XXA Sprain of ligaments of cervical spine, initial encounter: Secondary | ICD-10-CM | POA: Diagnosis not present

## 2018-10-17 DIAGNOSIS — S338XXA Sprain of other parts of lumbar spine and pelvis, initial encounter: Secondary | ICD-10-CM | POA: Diagnosis not present

## 2018-10-17 DIAGNOSIS — S233XXA Sprain of ligaments of thoracic spine, initial encounter: Secondary | ICD-10-CM | POA: Diagnosis not present

## 2018-10-21 DIAGNOSIS — S134XXA Sprain of ligaments of cervical spine, initial encounter: Secondary | ICD-10-CM | POA: Diagnosis not present

## 2018-10-21 DIAGNOSIS — S233XXA Sprain of ligaments of thoracic spine, initial encounter: Secondary | ICD-10-CM | POA: Diagnosis not present

## 2018-10-21 DIAGNOSIS — S338XXA Sprain of other parts of lumbar spine and pelvis, initial encounter: Secondary | ICD-10-CM | POA: Diagnosis not present

## 2018-10-24 DIAGNOSIS — S338XXA Sprain of other parts of lumbar spine and pelvis, initial encounter: Secondary | ICD-10-CM | POA: Diagnosis not present

## 2018-10-24 DIAGNOSIS — S233XXA Sprain of ligaments of thoracic spine, initial encounter: Secondary | ICD-10-CM | POA: Diagnosis not present

## 2018-10-24 DIAGNOSIS — S134XXA Sprain of ligaments of cervical spine, initial encounter: Secondary | ICD-10-CM | POA: Diagnosis not present

## 2018-11-04 DIAGNOSIS — S338XXA Sprain of other parts of lumbar spine and pelvis, initial encounter: Secondary | ICD-10-CM | POA: Diagnosis not present

## 2018-11-04 DIAGNOSIS — S233XXA Sprain of ligaments of thoracic spine, initial encounter: Secondary | ICD-10-CM | POA: Diagnosis not present

## 2018-11-04 DIAGNOSIS — S134XXA Sprain of ligaments of cervical spine, initial encounter: Secondary | ICD-10-CM | POA: Diagnosis not present

## 2018-11-13 DIAGNOSIS — S134XXA Sprain of ligaments of cervical spine, initial encounter: Secondary | ICD-10-CM | POA: Diagnosis not present

## 2018-11-13 DIAGNOSIS — S338XXA Sprain of other parts of lumbar spine and pelvis, initial encounter: Secondary | ICD-10-CM | POA: Diagnosis not present

## 2018-11-13 DIAGNOSIS — S233XXA Sprain of ligaments of thoracic spine, initial encounter: Secondary | ICD-10-CM | POA: Diagnosis not present

## 2018-11-26 ENCOUNTER — Other Ambulatory Visit: Payer: Self-pay | Admitting: Obstetrics and Gynecology

## 2018-11-26 DIAGNOSIS — Z1231 Encounter for screening mammogram for malignant neoplasm of breast: Secondary | ICD-10-CM

## 2018-12-05 DIAGNOSIS — L570 Actinic keratosis: Secondary | ICD-10-CM | POA: Diagnosis not present

## 2018-12-05 DIAGNOSIS — C44722 Squamous cell carcinoma of skin of right lower limb, including hip: Secondary | ICD-10-CM | POA: Diagnosis not present

## 2018-12-05 DIAGNOSIS — Z85828 Personal history of other malignant neoplasm of skin: Secondary | ICD-10-CM | POA: Diagnosis not present

## 2018-12-05 DIAGNOSIS — L814 Other melanin hyperpigmentation: Secondary | ICD-10-CM | POA: Diagnosis not present

## 2019-01-14 ENCOUNTER — Ambulatory Visit
Admission: RE | Admit: 2019-01-14 | Discharge: 2019-01-14 | Disposition: A | Discharge status: Home / Self Care | Payer: BLUE CROSS/BLUE SHIELD | Source: Ambulatory Visit | Attending: Obstetrics and Gynecology | Admitting: Obstetrics and Gynecology

## 2019-01-14 ENCOUNTER — Other Ambulatory Visit: Payer: Self-pay

## 2019-01-14 DIAGNOSIS — Z1231 Encounter for screening mammogram for malignant neoplasm of breast: Secondary | ICD-10-CM

## 2019-02-10 ENCOUNTER — Other Ambulatory Visit: Payer: Self-pay | Admitting: Family Medicine

## 2019-02-10 NOTE — Telephone Encounter (Signed)
One mo ok six mo ck up next mo

## 2019-02-12 ENCOUNTER — Ambulatory Visit: Payer: BC Managed Care – PPO | Admitting: Family Medicine

## 2019-03-12 ENCOUNTER — Other Ambulatory Visit: Payer: Self-pay

## 2019-03-12 ENCOUNTER — Encounter: Payer: Self-pay | Admitting: Obstetrics and Gynecology

## 2019-03-12 ENCOUNTER — Other Ambulatory Visit: Payer: Self-pay | Admitting: Obstetrics and Gynecology

## 2019-03-12 ENCOUNTER — Ambulatory Visit: Payer: BC Managed Care – PPO | Admitting: Obstetrics and Gynecology

## 2019-03-12 ENCOUNTER — Telehealth: Payer: Self-pay | Admitting: Obstetrics and Gynecology

## 2019-03-12 VITALS — BP 110/78 | HR 60 | Temp 97.3°F | Resp 16 | Ht 66.25 in | Wt 168.0 lb

## 2019-03-12 DIAGNOSIS — Z780 Asymptomatic menopausal state: Secondary | ICD-10-CM

## 2019-03-12 DIAGNOSIS — Z01419 Encounter for gynecological examination (general) (routine) without abnormal findings: Secondary | ICD-10-CM | POA: Diagnosis not present

## 2019-03-12 DIAGNOSIS — Z1231 Encounter for screening mammogram for malignant neoplasm of breast: Secondary | ICD-10-CM

## 2019-03-12 MED ORDER — PREMARIN 0.625 MG/GM VA CREA: TOPICAL_CREAM | VAGINAL | 2 refills | Status: DC

## 2019-03-12 NOTE — Patient Instructions (Signed)

## 2019-03-12 NOTE — Telephone Encounter (Signed)
Please place patient in pap recall for 2022.   She has a history of ASCUS pap and positive HR HPV with normal colposcopy.

## 2019-03-12 NOTE — Progress Notes (Signed)
63 y.o. G3P3 Widowed Caucasian female here for annual exam.    Gained 10 pounds.   Increased intestinal symptoms.  Having some fecal urgency.  Diarrhea resolved.  Thinks her sphincter is not as strong as it used to be.  Bladder control is good.  No leakage.   Still using Premarin cream 1/2 gram once to twice weekly.   Mother is in a nursing home.   PCP: Baltazar Apo, MD    No LMP recorded. Patient has had a hysterectomy.           Sexually active: No.  The current method of family planning is tubal ligation/Hysterectomy.    Exercising: Yes.    walking Smoker:  no  Health Maintenance: Pap: 01-30-18 Neg:Neg HR HPV, 01-15-17 Neg:Neg HR HPV, 10-11-15 ASCUS:Pos HR HPV History of abnormal Pap:  Yes, 10-11-15 ASCUS:Pos HR HPV--Colposcopy of vagina and vulva on 10/11/15--Bx of right and left vaginal apices showed no atypia or dysplasia. Biopsies of the right and left labia majora also negative for atypia and dysplasia. 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  Flu vaccine:  Completed.   reports that she has never smoked. She has never used smokeless tobacco. She reports current alcohol use of about 14.0 - 16.0 standard drinks of alcohol per week. She reports that she does not use drugs.  Past Medical History:  Diagnosis Date  . Anxiety   . Arthritis    fingers  . 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 Hepatitis    age 71 , unknown type  . History of vulvar dysplasia    VIN 2  . Hypertension   . IBS (irritable bowel syndrome)   . Osteopenia 2018  . Rectocele   . Urethral pain    mass  . Urinary incontinence   . 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 This is really VIN II and not VAIN II.   Marland Kitchen Wears contact lenses     Past Surgical History:  Procedure Laterality Date  . ABDOMINAL SACROCOLPOPEXY N/A 03/28/2016   Procedure: ABDOMINO SACROCOLPOPEXY;  Surgeon: Nunzio Cobbs, MD;  Location: [facility];  Service: Gynecology;  Laterality: N/A;  . ANTERIOR AND POSTERIOR REPAIR N/A 03/28/2016   Procedure: ANTERIOR (CYSTOCELE) AND POSTERIOR REPAIR (RECTOCELE);  Surgeon: Nunzio Cobbs, MD;  Location: [facility];  Service: Gynecology;  Laterality: N/A;  . AUGMENTATION MAMMAPLASTY  APRIL 2013   BREAST IMPLANT CHANGED  . BILATERAL SALPINGECTOMY Bilateral 03/28/2016   Procedure: BILATERAL SALPINGECTOMY;  Surgeon: Nunzio Cobbs, MD;  Location: [facility];  Service: Gynecology;  Laterality: Bilateral;  . BLADDER SUSPENSION N/A 03/28/2016   Procedure: TRANSVAGINAL TAPE (TVT) PROCEDURE exact midurethral sling;  Surgeon: Nunzio Cobbs, MD;  Location: [facility];  Service: Gynecology;  Laterality: N/A;  . COLONOSCOPY    . CYSTOSCOPY N/A 03/28/2016   Procedure: CYSTOSCOPY;  Surgeon: Nunzio Cobbs, MD;  Location: [facility];  Service: Gynecology;  Laterality: N/A;  . CYSTOSCOPY WITH BIOPSY Bilateral 11/03/2015   Procedure: CYSTOSCOPY WITH URETHRAL BIOPSY, FULGERATION, BILATERAL RETROGRADE PYELOGRAMS;  Surgeon: Alexis Frock, MD;  Location: [facility];  Service: Urology;  Laterality: Bilateral;  . LYSIS OF ADHESION N/A 03/28/2016   Procedure: LYSIS OF ADHESION;  Surgeon: Nunzio Cobbs, MD;  Location: [facility];  Service: Gynecology;  Laterality: N/A;  . MASS EXCISION N/A 11/03/2015   Procedure: EXCISION  URETHRAL MASS;  Surgeon: Alexis Frock, MD;  Location: [facility];  Service: Urology;  Laterality: N/A;  . TUBAL LIGATION    . VAGINAL HYSTERECTOMY  1999    Current Outpatient Medications  Medication Sig Dispense Refill  . acyclovir ointment (ZOVIRAX) 5 % Apply 1 application  topically every 3 (three) hours. Prn fever blisters 5 g 2  . ALPRAZolam (XANAX) 1 MG tablet TAKE 1 TO 1 AND 1/2 TABLETS AT BEDTIME. 45 tablet 0  . amLODipine-benazepril (LOTREL) 5-40 MG capsule TAKE (1) CAPSULE BY MOUTH ONCE DAILY. 30 capsule 5  . Biotin 1000 MCG tablet Take 1,000 mcg by mouth daily.    . hydrochlorothiazide (HYDRODIURIL) 25 MG tablet TAKE ONE TABLET BY MOUTH ONCE DAILY AS NEEDED FOR SWELLING. DO NOT EXCEED 3 PER WEEK. 30 tablet 5  . hydroquinone 4 % cream Apply 1 application topically as needed.    . hyoscyamine (LEVBID) 0.375 MG 12 hr tablet Take 1 tablet (0.375 mg total) by mouth as needed. 30 tablet 5  . lactobacillus acidophilus (BACID) TABS tablet Take 2 tablets by mouth 3 (three) times daily.    Marland Kitchen PREMARIN vaginal cream INSERT 1/2 GRAM VAGINALLY TWICE WEEKLY. 30 g 0  . valACYclovir (VALTREX) 1000 MG tablet Take 2 pills po at onset of fever blister; repeat once in 12 hours 4 tablet 5   No current facility-administered medications for this visit.     Family History  Problem Relation Age of Onset  . Hypertension Mother   . Osteoporosis Mother   . Paget's disease of bone Mother   . Heart failure Mother   . Cancer Father        MELANOMA  . Diabetes Father   . Hypertension Father   . Heart disease Father   . Cancer Sister        THYROID  . Breast cancer Paternal Grandmother   . Colon cancer Neg Hx     Review of Systems  All other systems reviewed and are negative.   Exam:   BP 110/78   Pulse 60   Temp (!) 97.3 F (36.3 C) (Temporal)   Resp 16   Ht 5' 6.25" (1.683 m)   Wt 168 lb (76.2 kg)   BMI 26.91 kg/m     General appearance: alert, cooperative and appears stated age Head: normocephalic, without obvious abnormality, atraumatic Neck: no adenopathy, supple, symmetrical, trachea midline and thyroid normal to inspection and palpation Lungs: clear to auscultation bilaterally Breasts: bilateral implants, normal appearance, no masses or tenderness, No  nipple retraction or dimpling, No nipple discharge or bleeding, No axillary adenopathy Heart: regular rate and rhythm Abdomen: soft, non-tender; no masses, no organomegaly Extremities: extremities normal, atraumatic, no cyanosis or edema Skin: skin color, texture, turgor normal. No rashes or lesions Lymph nodes: cervical, supraclavicular, and axillary nodes normal. Neurologic: grossly normal  Pelvic: External genitalia:  no lesions              No abnormal inguinal nodes palpated.              Urethra:  normal appearing urethra with no masses, tenderness or lesions  Bartholins and Skenes: normal                 Vagina: normal appearing vagina with pale color and discharge, no lesions.  Good support.               Cervix: absent              Pap taken: No. Bimanual Exam:  Uterus:  absent              Adnexa: no mass, fullness, tenderness              Rectal exam: Yes.  .  Confirms.              Anus:  normal sphincter tone, no lesions  Chaperone was present for exam.  Assessment:   Well woman visit with normal exam. Status post TVH.  Status post abdominosacrocolpopexy and ant and post repair, TVT/cysto.  Good support. Hx VIN II.No signs of recurrence. Hx ASCUS and positive HR HPV of vagina. Status post excision of urethral mass. Condyloma. Osteopenia. Fecal urgency.   Plan: Mammogram screening discussed. Self breast awareness reviewed. Pap and HR HPV in 2022.   5 year risk of CIN 3+ is 0.18%.  Guidelines for Calcium, Vitamin D, regular exercise program including cardiovascular and weight bearing exercise. Metamucil.  Refill of Premarin cream.  Use 1/2 gram twice weekly.  BMD next year.  Follow up annually and prn.  After visit summary provided.

## 2019-03-17 ENCOUNTER — Other Ambulatory Visit: Payer: Self-pay | Admitting: Family Medicine

## 2019-03-17 NOTE — Telephone Encounter (Signed)
One mo ok sched virt f u

## 2019-03-20 NOTE — Telephone Encounter (Signed)
36 recall entered for 02/2021.

## 2019-04-07 ENCOUNTER — Telehealth: Payer: Self-pay | Admitting: Family Medicine

## 2019-04-07 DIAGNOSIS — Z20828 Contact with and (suspected) exposure to other viral communicable diseases: Secondary | ICD-10-CM | POA: Diagnosis not present

## 2019-04-07 DIAGNOSIS — Z1152 Encounter for screening for COVID-19: Secondary | ICD-10-CM

## 2019-04-07 DIAGNOSIS — Z20822 Contact with and (suspected) exposure to covid-19: Secondary | ICD-10-CM

## 2019-04-07 NOTE — Telephone Encounter (Signed)
Covid 19 antibody

## 2019-04-07 NOTE — Telephone Encounter (Signed)
Pt would like to have the COVID antibody test. She thinks she possible had COVID back in October due to some stomach issues she as having and being tired a lot. She keeps running into people who have potentially been exposed and she wants to have the lab work done so she doesn't have to keep getting tested.

## 2019-04-07 NOTE — Telephone Encounter (Signed)
Bw order put in and pt notified.

## 2019-04-08 LAB — SAR COV2 SEROLOGY (COVID19)AB(IGG),IA: DiaSorin SARS-CoV-2 Ab, IgG: NEGATIVE

## 2019-04-16 ENCOUNTER — Other Ambulatory Visit: Payer: Self-pay | Admitting: Family Medicine

## 2019-04-16 NOTE — Telephone Encounter (Signed)
Please schedule visit

## 2019-04-16 NOTE — Telephone Encounter (Signed)
1 month recommend virtual 70-month follow-up in January

## 2019-04-17 NOTE — Telephone Encounter (Signed)
Scheduled 11/1

## 2019-04-28 ENCOUNTER — Other Ambulatory Visit: Payer: Self-pay

## 2019-04-28 ENCOUNTER — Ambulatory Visit (INDEPENDENT_AMBULATORY_CARE_PROVIDER_SITE_OTHER): Payer: BC Managed Care – PPO | Admitting: Family Medicine

## 2019-04-28 ENCOUNTER — Encounter: Payer: Self-pay | Admitting: Family Medicine

## 2019-04-28 DIAGNOSIS — I10 Essential (primary) hypertension: Secondary | ICD-10-CM

## 2019-04-28 DIAGNOSIS — F411 Generalized anxiety disorder: Secondary | ICD-10-CM | POA: Diagnosis not present

## 2019-04-28 MED ORDER — HYDROCHLOROTHIAZIDE 25 MG PO TABS: ORAL_TABLET | ORAL | 5 refills | Status: DC

## 2019-04-28 MED ORDER — AMLODIPINE BESY-BENAZEPRIL HCL 5-40 MG PO CAPS: ORAL_CAPSULE | ORAL | 5 refills | Status: DC

## 2019-04-28 MED ORDER — HYOSCYAMINE SULFATE ER 0.375 MG PO TB12: 0.3750 mg | ORAL_TABLET | ORAL | 5 refills | Status: DC | PRN

## 2019-04-28 NOTE — Progress Notes (Signed)
   Subjective:    Patient ID: Stephanie Armstrong, female    DOB: [birth year], 64 y.o.   MRN: [id number]  Hypertension This is a chronic problem. The current episode started more than 1 year ago. Risk factors for coronary artery disease include post-menopausal state. Treatments tried: hctz, lotrel. There are no compliance problems.    Patient states her blood pressure is running good- also needs refills of hycosamine  Virtual Visit via Video Note  I connected with Stephanie Armstrong on 04/28/19 at  1:10 PM EST by a video enabled telemedicine application and verified that I am speaking with the correct person using two identifiers.  Location: Patient: home Provider: office   I discussed the limitations of evaluation and management by telemedicine and the availability of in person appointments. The patient expressed understanding and agreed to proceed.  History of Present Illness:    Observations/Objective:   Assessment and Plan:   Follow Up Instructions:    I discussed the assessment and treatment plan with the patient. The patient was provided an opportunity to ask questions and all were answered. The patient agreed with the plan and demonstrated an understanding of the instructions.   The patient was advised to call back or seek an in-person evaluation if the symptoms worsen or if the condition fails to improve as anticipated.  I provided 20 minutes of non-face-to-face time during this encounter.  Blood pressure medicine and blood pressure levels reviewed today with patient. Compliant with blood pressure medicine. States does not miss a dose. No obvious side effects. Blood pressure generally good when checked elsewhere. Watching salt intake.  Patient compliant with insomnia medication. Generally takes most nights. No obvious morning drowsiness. Definitely helps patient sleep. Without it patient states would not get a good nights rest.    Review of Systems No headache, no  major weight loss or weight gain, no chest pain no back pain abdominal pain no change in bowel habits complete ROS otherwise negative     Objective:   Physical Exam  Virtual      Assessment & Plan:  Impression 1 hypertension good control discussed to maintain same meds  2.  Insomnia ongoing.  With definite need for meds meds refilled to maintain  Medications refilled take care diet exercise discussed medicines refilled follow-up in 6 months

## 2019-05-19 ENCOUNTER — Other Ambulatory Visit: Payer: Self-pay | Admitting: Family Medicine

## 2019-05-20 ENCOUNTER — Telehealth: Payer: Self-pay | Admitting: Family Medicine

## 2019-05-20 MED ORDER — ALPRAZOLAM 1 MG PO TABS: ORAL_TABLET | ORAL | 5 refills | Status: DC

## 2019-05-20 NOTE — Telephone Encounter (Signed)
Med check up 04/28/19

## 2019-05-20 NOTE — Telephone Encounter (Signed)
Pt seen 04/28/19. Please advise. Thank you

## 2019-05-20 NOTE — Telephone Encounter (Signed)
Pt contacted. Pt did get in touch with pharmacy regarding Amlodipine. Pt has refills on Amlodipine; just needing Xanax sent to pharmacy. Script printed and awaiting signature, will fax to [place]. Pt verbalized understanding.

## 2019-05-20 NOTE — Telephone Encounter (Signed)
Patient just had a phone visit a couple of weeks ago but said her Xanax did not get refilled. She also said that her Amlodipine said #30 with no refills.  I told her on our end it shows 5 refills so she could check with the pharmacy on that one.  Xanax to [facility].

## 2019-05-20 NOTE — Telephone Encounter (Signed)
Six mo both

## 2019-05-22 ENCOUNTER — Encounter: Payer: Self-pay | Admitting: Family Medicine

## 2019-05-30 DIAGNOSIS — B070 Plantar wart: Secondary | ICD-10-CM | POA: Diagnosis not present

## 2019-05-30 DIAGNOSIS — L570 Actinic keratosis: Secondary | ICD-10-CM | POA: Diagnosis not present

## 2019-05-30 DIAGNOSIS — Z85828 Personal history of other malignant neoplasm of skin: Secondary | ICD-10-CM | POA: Diagnosis not present

## 2019-05-30 DIAGNOSIS — B078 Other viral warts: Secondary | ICD-10-CM | POA: Diagnosis not present

## 2019-06-13 ENCOUNTER — Encounter: Payer: Self-pay | Admitting: Nurse Practitioner

## 2019-06-13 ENCOUNTER — Other Ambulatory Visit: Payer: Self-pay

## 2019-06-13 ENCOUNTER — Ambulatory Visit (INDEPENDENT_AMBULATORY_CARE_PROVIDER_SITE_OTHER): Payer: BC Managed Care – PPO | Admitting: Nurse Practitioner

## 2019-06-13 DIAGNOSIS — R197 Diarrhea, unspecified: Secondary | ICD-10-CM

## 2019-06-13 NOTE — Progress Notes (Signed)
  Phone visit  Subjective:    Patient ID: Stephanie Armstrong, female    DOB: [birth year], 64 y.o.   MRN: [id number]  HPI  Patient calls with diarrhea for 2 weeks. Patient reports no other symptoms.  Virtual Visit via Video Note  I connected with Stephanie Armstrong on 06/13/19 at  3:40 PM EST by a video enabled telemedicine application and verified that I am speaking with the correct person using two identifiers.  Location: Patient: home Provider: office   I discussed the limitations of evaluation and management by telemedicine and the availability of in person appointments. The patient expressed understanding and agreed to proceed.  History of Present Illness: Presents for complaints of diarrhea for the past 2 weeks.  Began after starting [employer] diet plan which includes prepackaged foods.  This caused constipation but she has been off of this for about 3 weeks now.  Has a history of IBS.  Has urgency with liquid stools at least 2-3 times per day.  Describes her bowels as gurgling.  Has to get up at least twice a night due to her symptoms.  Has had one episode of leakage.  No nausea vomiting abdominal pain or blood in her stool.  Describes the color as oranges brown.  Occasional slight greasy look to her stools.  Has cut out all of her coffee.  Mainly eating a bland diet.  Taking a bowel probiotic.  Tried Imodium with only slight relief.  Worse with certain foods such as pizza spaghetti or tacos.  Today she has had plain toast and a half a baked potato.  Diarrhea is worse with her first stool of the day.  Records show she had a similar issue on 01/07/2018.  No known contacts.  No recent trips out of the country.  Records shows no recent antibiotic use.   Observations/Objective: Today's visit was via telephone Physical exam was not possible for this visit Alert, oriented.  Mildly anxious speech.  Assessment and Plan: Diarrhea, unspecified type  Meds ordered this encounter  Medications  .  diphenoxylate-atropine (LOMOTIL) 2.5-0.025 MG tablet    Sig: Take 2 tablets by mouth 4 (four) times daily as needed for diarrhea or loose stools. Max 8 pills in 24 hours.    Dispense:  30 tablet    Refill:  0    Order Specific Question:   Supervising Provider    Answer:   Sallee Lange A [[id number]]      Follow Up Instructions: Switch to Lomotil as directed for diarrhea.  Continue bland diet. Call back next week if no improvement, sooner if worse.  Warning signs reviewed including fever abdominal pain vomiting or bloody stools.  Patient verbalizes understanding.   I discussed the assessment and treatment plan with the patient. The patient was provided an opportunity to ask questions and all were answered. The patient agreed with the plan and demonstrated an understanding of the instructions.   The patient was advised to call back or seek an in-person evaluation if the symptoms worsen or if the condition fails to improve as anticipated.  I provided 15 minutes of non-face-to-face time during this encounter.      Review of Systems     Objective:   Physical Exam        Assessment & Plan:

## 2019-06-14 ENCOUNTER — Encounter: Payer: Self-pay | Admitting: Nurse Practitioner

## 2019-06-14 MED ORDER — DIPHENOXYLATE-ATROPINE 2.5-0.025 MG PO TABS: 2.0000 | ORAL_TABLET | Freq: Four times a day (QID) | ORAL | 0 refills | Status: DC | PRN

## 2019-06-18 ENCOUNTER — Encounter: Payer: Self-pay | Admitting: Obstetrics and Gynecology

## 2019-06-19 ENCOUNTER — Telehealth: Payer: Self-pay | Admitting: Obstetrics and Gynecology

## 2019-06-19 NOTE — Telephone Encounter (Signed)
Shamyra, Kinsler P  P [facility]  Phone Number: [phone]  I tried to fill my prescription for Premarin today. I have a new insurance and they are asking for prior authorization. I really don't understand why since its part of my prescribed care, but anyway if you can give them whatever authorization they need , I would appreciate it. Something will likely be sent to your office from [facility]. Thank you [employer]

## 2019-06-19 NOTE — Telephone Encounter (Signed)
PA for premarin vaginal cream submitted to plan via [email or web address].   (Key: [id number])  [employer] has received your information, and the request will be reviewed. You may close this dialog, return to your dashboard, and perform other tasks.  You will receive an electronic determination in CoverMyMeds. You can see the latest determination by locating this request on your dashboard or by reopening this request. You will also receive a faxed copy of the determination. If you have any questions please contact [employer] at [phone].  If you need assistance, please chat with CoverMyMeds or call us at [phone]   Patient notified. Patient is aware she will be notified once response received.

## 2019-06-23 ENCOUNTER — Encounter: Payer: Self-pay | Admitting: Nurse Practitioner

## 2019-06-23 MED ORDER — ESTRADIOL 0.1 MG/GM VA CREA: TOPICAL_CREAM | VAGINAL | 0 refills | Status: DC

## 2019-06-23 MED ORDER — DIPHENOXYLATE-ATROPINE 2.5-0.025 MG PO TABS: 2.0000 | ORAL_TABLET | Freq: Four times a day (QID) | ORAL | 0 refills | Status: DC | PRN

## 2019-06-23 NOTE — Telephone Encounter (Signed)
Ok for estradiol cream, place 1/2 gram pv at hs two to three times per week as needed. Disp:  42.5 gram, RF none.

## 2019-06-23 NOTE — Telephone Encounter (Signed)
Left message to return call 

## 2019-06-23 NOTE — Telephone Encounter (Signed)
PA denied for premarin vaginal cream.  Plan requires trial and failure or contraindication to 3 formulary alternatives.    Call to patient, advised as seen above. Reviewed provided alteratives. Patient request RX for estradiol vaginal cream 0.1mg /gm, if comparable.   Advised patient I will review request with Dr. Quincy Simmonds and f/u with recommendations, patient agreeable.   Dr. Quincy Simmonds -please advise on estradiol vag cream.

## 2019-06-23 NOTE — Telephone Encounter (Signed)
Call to patient, left detailed message, ok per dpr. Advised per Dr. Quincy Simmonds, Rx to [facility]. Return call to office if any additional questions.   Encounter closed.

## 2019-08-23 ENCOUNTER — Other Ambulatory Visit: Payer: Self-pay | Admitting: Nurse Practitioner

## 2019-08-25 ENCOUNTER — Encounter: Payer: Self-pay | Admitting: Nurse Practitioner

## 2019-08-25 NOTE — Telephone Encounter (Signed)
Ok times one 

## 2019-08-25 NOTE — Telephone Encounter (Signed)
Seen 06/13/19 for diarrhea

## 2019-08-26 ENCOUNTER — Other Ambulatory Visit: Payer: Self-pay | Admitting: Nurse Practitioner

## 2019-09-03 ENCOUNTER — Encounter: Payer: Self-pay | Admitting: Nurse Practitioner

## 2019-09-24 ENCOUNTER — Ambulatory Visit: Payer: BC Managed Care – PPO | Admitting: Nurse Practitioner

## 2019-10-03 ENCOUNTER — Encounter: Payer: Self-pay | Admitting: Nurse Practitioner

## 2019-10-03 ENCOUNTER — Other Ambulatory Visit: Payer: Self-pay | Admitting: Nurse Practitioner

## 2019-10-04 ENCOUNTER — Other Ambulatory Visit: Payer: Self-pay | Admitting: Nurse Practitioner

## 2019-10-04 MED ORDER — DIPHENOXYLATE-ATROPINE 2.5-0.025 MG PO TABS: ORAL_TABLET | ORAL | 0 refills | Status: DC

## 2019-11-26 ENCOUNTER — Other Ambulatory Visit (INDEPENDENT_AMBULATORY_CARE_PROVIDER_SITE_OTHER): Payer: 59 | Admitting: *Deleted

## 2019-11-26 ENCOUNTER — Other Ambulatory Visit: Payer: Self-pay

## 2019-11-26 DIAGNOSIS — Z23 Encounter for immunization: Secondary | ICD-10-CM | POA: Diagnosis not present

## 2019-12-09 ENCOUNTER — Telehealth: Payer: Self-pay | Admitting: Family Medicine

## 2019-12-09 NOTE — Telephone Encounter (Signed)
Pt needs visit in next 30 days. To f/u for insomnia. Small amt given till appt.  Thx. Dr. Lovena Le

## 2019-12-10 NOTE — Telephone Encounter (Signed)
Patient notified

## 2019-12-10 NOTE — Telephone Encounter (Signed)
Sent in xanax to her pharmacy. Dr. Lovena Le

## 2019-12-10 NOTE — Telephone Encounter (Signed)
Please contact patient to have appt set up.

## 2019-12-10 NOTE — Telephone Encounter (Signed)
Scheduled 9/15

## 2019-12-31 ENCOUNTER — Encounter: Payer: Self-pay | Admitting: Family Medicine

## 2019-12-31 ENCOUNTER — Other Ambulatory Visit: Payer: Self-pay

## 2019-12-31 ENCOUNTER — Ambulatory Visit (INDEPENDENT_AMBULATORY_CARE_PROVIDER_SITE_OTHER): Payer: 59 | Admitting: Family Medicine

## 2019-12-31 VITALS — BP 122/82 | HR 79 | Temp 97.9°F | Ht 66.0 in | Wt 151.6 lb

## 2019-12-31 DIAGNOSIS — F5101 Primary insomnia: Secondary | ICD-10-CM | POA: Diagnosis not present

## 2019-12-31 DIAGNOSIS — K529 Noninfective gastroenteritis and colitis, unspecified: Secondary | ICD-10-CM

## 2019-12-31 DIAGNOSIS — G8929 Other chronic pain: Secondary | ICD-10-CM

## 2019-12-31 DIAGNOSIS — I10 Essential (primary) hypertension: Secondary | ICD-10-CM | POA: Diagnosis not present

## 2019-12-31 DIAGNOSIS — M545 Low back pain, unspecified: Secondary | ICD-10-CM

## 2019-12-31 DIAGNOSIS — F411 Generalized anxiety disorder: Secondary | ICD-10-CM

## 2019-12-31 MED ORDER — ALPRAZOLAM 0.5 MG PO TABS: ORAL_TABLET | ORAL | 0 refills | Status: AC

## 2019-12-31 MED ORDER — IBUPROFEN 800 MG PO TABS: 800.0000 mg | ORAL_TABLET | Freq: Three times a day (TID) | ORAL | 0 refills | Status: AC | PRN

## 2019-12-31 MED ORDER — DIPHENOXYLATE-ATROPINE 2.5-0.025 MG PO TABS: ORAL_TABLET | ORAL | 0 refills | Status: AC

## 2019-12-31 NOTE — Progress Notes (Signed)
Patient ID: Stephanie Armstrong, female    DOB: Oct 31, 1955, 64 y.o.   MRN: [id number]   Chief Complaint  Patient presents with  . Hypertension    follow up- needs refill of Lomotil for IBS and Xanax- also having flare of chronic back pain   Subjective:    HPI Pt seen for f/u on HTN.  Chronic diarrhea-  Has h/o IBS, per pt, not dx in chart. Pt taking lomotil for IBS-diarrhea.   After eating having diarrhea. Pt watching her diet and taking pro-biotics. Stress causes her some diarrhea.  Certain foods are worsening the diarrhea. Can't eat salads back to back.  In 2014- seen by Dr. Henrene Pastor, GI in past for Stomach pain/diarrhea./IBS??.  Had colonoscopy and pt stating was normal.  2017- rectocele surgery.   Has been taking lomotil and hyoscyamine prn. Not taking lomotil daily, only as needed.  occ tries metamucil. Not seeing GI for this now.  Insomnia/anxiety- Xanax- taking 10m tablet at night and 1/2 tab during the day prn. occ 3x per week.  Mother passed away last year and inc stress with caregiving prior to that.  Tried otc med at night, grogginess and not feeling right after waking up.  Pt was on lexapro for 1 wk and pt stating feeling "blunted" and "not caring about anything." feeling.  HTN Pt compliant with BP meds.  No SEs Denies chest pain, sob, LE swelling, or blurry vision.   Medical History JA[place]as a past medical history of Anxiety, Arthritis, ASCUS with positive high risk human papillomavirus of vagina (dx 10/11/15), Cancer ([place], Hepatitis, History of vulvar dysplasia, Hypertension, IBS (irritable bowel syndrome), Osteopenia (2018), Rectocele, Urethral pain, Urinary incontinence, VAIN II (vaginal intraepithelial neoplasia grade II) (dx 08/2015), and Wears contact lenses.   Outpatient Encounter Medications as of 12/31/2019  Medication Sig  . acyclovir ointment (ZOVIRAX) 5 % Apply 1 application topically every 3 (three) hours. Prn fever blisters  . ALPRAZolam  (XANAX) 0.5 MG tablet Take 1 tab p.o. qhs for 2 weeks, then take 1/2 tab p.o. qhs, then take 1/2 tab qod, then discontinue.  .Marland KitchenamLODipine-benazepril (LOTREL) 5-40 MG capsule TAKE (1) CAPSULE BY MOUTH ONCE DAILY.  .Marland KitchenBiotin 1000 MCG tablet Take 1,000 mcg by mouth daily.  .Marland Kitchenconjugated estrogens (PREMARIN) vaginal cream INSERT 1/2 GRAM VAGINALLY TWICE WEEKLY.  . diphenoxylate-atropine (LOMOTIL) 2.5-0.025 MG tablet TAKE 2 TABLETS BY MOUTH 4 TIMES DAILY AS NEEDED FOR DIARRHEA OR LOOSE STOOLS. MAX 8 TABLETS IN 24 HOURS.  .Marland Kitchenestradiol (ESTRACE VAGINAL) 0.1 MG/GM vaginal cream Place 1/2 gram vaginally at hs two to three times per week as needed.  . hydrochlorothiazide (HYDRODIURIL) 25 MG tablet TAKE ONE TABLET BY MOUTH ONCE DAILY AS NEEDED FOR SWELLING. DO NOT EXCEED 3 PER WEEK.  . hydroquinone 4 % cream Apply 1 application topically as needed.  . hyoscyamine (LEVBID) 0.375 MG 12 hr tablet Take 1 tablet (0.375 mg total) by mouth as needed.  .Marland Kitchenibuprofen (ADVIL) 800 MG tablet Take 1 tablet (800 mg total) by mouth every 8 (eight) hours as needed.  . lactobacillus acidophilus (BACID) TABS tablet Take 2 tablets by mouth 3 (three) times daily.  . valACYclovir (VALTREX) 1000 MG tablet Take 2 pills po at onset of fever blister; repeat once in 12 hours  . [DISCONTINUED] ALPRAZolam (XANAX) 1 MG tablet Take 1 tablet (1 mg total) by mouth at bedtime as needed for anxiety. TAKE 1 TO 1 AND 1/2 TABLETS AT BEDTIME.  . [DISCONTINUED] diphenoxylate-atropine (LOMOTIL)  2.5-0.025 MG tablet TAKE 2 TABLETS BY MOUTH 4 TIMES DAILY AS NEEDED FOR DIARRHEA OR LOOSE STOOLS. MAX 8 TABLETS IN 24 HOURS.   No facility-administered encounter medications on file as of 12/31/2019.     Review of Systems  Constitutional: Negative for chills and fever.  HENT: Negative for congestion, rhinorrhea and sore throat.   Respiratory: Negative for cough, shortness of breath and wheezing.   Cardiovascular: Negative for chest pain and leg swelling.    Gastrointestinal: Positive for diarrhea (intermittent, chronic). Negative for abdominal pain, nausea and vomiting.  Genitourinary: Negative for dysuria and frequency.  Musculoskeletal: Negative for arthralgias and back pain.  Skin: Negative for rash.  Neurological: Negative for dizziness, weakness and headaches.  Psychiatric/Behavioral: Positive for sleep disturbance. Negative for confusion, decreased concentration, dysphoric mood, hallucinations, self-injury and suicidal ideas. The patient is nervous/anxious.      Vitals BP 122/82   Pulse 79   Temp 97.9 F (36.6 C) (Oral)   Ht '5\' 6"'  (1.676 m)   Wt 151 lb 9.6 oz (68.8 kg)   SpO2 98%   BMI 24.47 kg/m   Objective:   Physical Exam Vitals and nursing note reviewed.  Constitutional:      Appearance: Normal appearance.  HENT:     Head: Normocephalic and atraumatic.     Nose: Nose normal.     Mouth/Throat:     Mouth: Mucous membranes are moist.     Pharynx: Oropharynx is clear.  Eyes:     Extraocular Movements: Extraocular movements intact.     Conjunctiva/sclera: Conjunctivae normal.     Pupils: Pupils are equal, round, and reactive to light.  Cardiovascular:     Rate and Rhythm: Normal rate and regular rhythm.     Pulses: Normal pulses.     Heart sounds: Normal heart sounds.  Pulmonary:     Effort: Pulmonary effort is normal.     Breath sounds: Normal breath sounds. No wheezing, rhonchi or rales.  Musculoskeletal:        General: Normal range of motion.     Right lower leg: No edema.     Left lower leg: No edema.  Skin:    General: Skin is warm and dry.     Findings: No lesion or rash.  Neurological:     General: No focal deficit present.     Mental Status: She is alert and oriented to person, place, and time.  Psychiatric:        Behavior: Behavior normal.     Comments: +anxious affect.      Assessment and Plan   1. Essential hypertension - CBC - CMP14+EGFR - Lipid panel  2. Chronic low back pain  without sciatica, unspecified back pain laterality - ibuprofen (ADVIL) 800 MG tablet; Take 1 tablet (800 mg total) by mouth every 8 (eight) hours as needed.  Dispense: 30 tablet; Refill: 0  3. Generalized anxiety disorder  4. Primary insomnia - ALPRAZolam (XANAX) 0.5 MG tablet; Take 1 tab p.o. qhs for 2 weeks, then take 1/2 tab p.o. qhs, then take 1/2 tab qod, then discontinue.  Dispense: 30 tablet; Refill: 0  5. Chronic diarrhea - diphenoxylate-atropine (LOMOTIL) 2.5-0.025 MG tablet; TAKE 2 TABLETS BY MOUTH 4 TIMES DAILY AS NEEDED FOR DIARRHEA OR LOOSE STOOLS. MAX 8 TABLETS IN 24 HOURS.  Dispense: 30 tablet; Refill: 0   htn- stable. Cont meds. Chronic back pain- pt requesting 800g ibuprofen.    Pt requesting refill on lomotil. Pt needing labs before more bp  meds.  Gave tapering dose of xanax.  Pt declining referral to psychiatry. Pt declining to try alternative for sleep at night or trying an SSRI.  Discussed with pt that I recommending a taper down to 0.47m xanax qhs. Discussed alternatives with SSRI or trazodone at night for sleep. Pt willing/not willing to try alternative for insomnia.   Pt declining at this time to see psychiatry for anxiety/insomnia.  Advised pt that I don't treat insomnia with long term benzodiazepines due to safety concerns of long term use can lead to early memory loss, dementia or increased risk of falls.  Pt voiced understanding. Gave pt a tapering dose to take with 30 tab of 0.574mxanax.  Gave instructions on how to taper off the medications.  F/u 43m44mo prn.

## 2020-01-02 LAB — LIPID PANEL
Chol/HDL Ratio: 2.1 {ratio} (ref 0.0–4.4)
Cholesterol, Total: 185 mg/dL (ref 100–199)
HDL: 87 mg/dL (ref >39)
LDL Chol Calc (NIH): 88 mg/dL (ref 0–99)
Triglycerides: 48 mg/dL (ref 0–149)
VLDL Cholesterol Cal: 10 mg/dL (ref 5–40)

## 2020-01-02 LAB — CMP14+EGFR
ALT: 19 [IU]/L (ref 0–32)
AST: 21 [IU]/L (ref 0–40)
Albumin/Globulin Ratio: 1.9 (ref 1.2–2.2)
Albumin: 4.5 g/dL (ref 3.8–4.8)
Alkaline Phosphatase: 65 [IU]/L (ref 44–121)
BUN/Creatinine Ratio: 25 (ref 12–28)
BUN: 17 mg/dL (ref 8–27)
Bilirubin Total: 0.3 mg/dL (ref 0.0–1.2)
CO2: 24 mmol/L (ref 20–29)
Calcium: 9.8 mg/dL (ref 8.7–10.3)
Chloride: 103 mmol/L (ref 96–106)
Creatinine, Ser: 0.67 mg/dL (ref 0.57–1.00)
GFR calc Af Amer: 107 mL/min/{1.73_m2} (ref >59)
GFR calc non Af Amer: 93 mL/min/{1.73_m2} (ref >59)
Globulin, Total: 2.4 g/dL (ref 1.5–4.5)
Glucose: 81 mg/dL (ref 65–99)
Potassium: 4.6 mmol/L (ref 3.5–5.2)
Sodium: 140 mmol/L (ref 134–144)
Total Protein: 6.9 g/dL (ref 6.0–8.5)

## 2020-01-02 LAB — CBC
Hematocrit: 41.7 % (ref 34.0–46.6)
Hemoglobin: 14.1 g/dL (ref 11.1–15.9)
MCH: 31.1 pg (ref 26.6–33.0)
MCHC: 33.8 g/dL (ref 31.5–35.7)
MCV: 92 fL (ref 79–97)
Platelets: 255 10*3/uL (ref 150–450)
RBC: 4.53 x10E6/uL (ref 3.77–5.28)
RDW: 12.6 % (ref 11.7–15.4)
WBC: 5.8 10*3/uL (ref 3.4–10.8)

## 2020-01-05 ENCOUNTER — Other Ambulatory Visit: Payer: Self-pay

## 2020-01-05 DIAGNOSIS — Z780 Asymptomatic menopausal state: Secondary | ICD-10-CM

## 2020-01-05 MED ORDER — ESTRADIOL 0.1 MG/GM VA CREA: TOPICAL_CREAM | VAGINAL | 0 refills | Status: DC

## 2020-01-05 NOTE — Telephone Encounter (Signed)
Medication refill request: Estradiol 0.01% cream  Last AEX:  03/12/19 Next AEX: 03/15/20 Last MMG (if hormonal medication request): 01/14/19 Neg  Refill authorized: 42g/0

## 2020-01-16 ENCOUNTER — Ambulatory Visit: Payer: BC Managed Care – PPO

## 2020-01-16 ENCOUNTER — Other Ambulatory Visit: Payer: BC Managed Care – PPO

## 2020-01-18 ENCOUNTER — Encounter: Payer: Self-pay | Admitting: Obstetrics and Gynecology

## 2020-01-19 ENCOUNTER — Telehealth: Payer: Self-pay

## 2020-01-19 NOTE — Telephone Encounter (Signed)
Patient sent the following message via MyChart.  I had an appointment scheduled in Oct, but the office rescheduled it until much later . I was going to ask Dr Quincy Simmonds during that visit who she would recommend that I see about my worsening IBS ( if in fact it is IBS) with very frequent , urgent-diarrhea. It completely dictates my entire daily life.   I Have a lot of confidence in Dr Quincy Simmonds and I would welcome her advice on a Dr that might be knowledgeable in this area.  My primary Dr has recently retired and I really wasn't pleased with the replacement , so I will be looking for a new primary. I'm fearful I have a serious problem., and that's why I am seeking advice from Dr Quincy Simmonds. Thank you.

## 2020-01-19 NOTE — Telephone Encounter (Signed)
Patient has seen Dr. Henrene Pastor at [facility].  She can return to that office for a visit.  If Dr. Henrene Pastor is not available, she could see Dr. Burna Mortimer or Dr. Tarri Glenn.  I am happy to refer her.

## 2020-01-19 NOTE — Telephone Encounter (Signed)
Spoke with patient. Patient reports ongoing diarrhea, discussed fecal urgency at 02/2019 AEX w/ Dr. Quincy Simmonds, did not try Metamucil. Patient did see her PCP, was prescribed Lomotil, not effective. Patient is requesting a referral to GI. Denies any new symptoms. Has not discussed referral or requested referral from PCP. Advised I will review request with Dr. Quincy Simmonds and f/u, patient agreeable.   Next AEX 05/05/20   Dr. Quincy Simmonds - please advise on GI referral.

## 2020-01-19 NOTE — Telephone Encounter (Signed)
Spoke with patient, advised per Dr. Quincy Simmonds. Patient will call to schedule, will return call if referral is needed. Patient thankful for return call.   Encounter closed.

## 2020-03-15 ENCOUNTER — Ambulatory Visit: Payer: BC Managed Care – PPO | Admitting: Obstetrics and Gynecology

## 2020-03-22 DIAGNOSIS — R2232 Localized swelling, mass and lump, left upper limb: Secondary | ICD-10-CM | POA: Diagnosis not present

## 2020-03-22 DIAGNOSIS — Z6824 Body mass index (BMI) 24.0-24.9, adult: Secondary | ICD-10-CM | POA: Diagnosis not present

## 2020-03-25 ENCOUNTER — Encounter: Payer: Self-pay | Admitting: Internal Medicine

## 2020-03-25 ENCOUNTER — Other Ambulatory Visit: Payer: HMO

## 2020-03-25 ENCOUNTER — Ambulatory Visit: Payer: PPO | Admitting: Internal Medicine

## 2020-03-25 ENCOUNTER — Other Ambulatory Visit: Payer: Self-pay

## 2020-03-25 VITALS — BP 122/80 | HR 70 | Ht 66.0 in | Wt 149.0 lb

## 2020-03-25 DIAGNOSIS — R634 Abnormal weight loss: Secondary | ICD-10-CM | POA: Diagnosis not present

## 2020-03-25 DIAGNOSIS — K909 Intestinal malabsorption, unspecified: Secondary | ICD-10-CM

## 2020-03-25 DIAGNOSIS — R197 Diarrhea, unspecified: Secondary | ICD-10-CM

## 2020-03-25 DIAGNOSIS — K580 Irritable bowel syndrome with diarrhea: Secondary | ICD-10-CM | POA: Diagnosis not present

## 2020-03-25 MED ORDER — PLENVU 140 G PO SOLR: 1.0000 | Freq: Once | ORAL | 0 refills | Status: AC

## 2020-03-25 NOTE — Patient Instructions (Signed)
Your provider has requested that you go to the basement level for lab work before leaving today. Press "B" on the elevator. The lab is located at the first door on the left as you exit the elevator.   You have been scheduled for a colonoscopy. Please follow written instructions given to you at your visit today.  Please pick up your prep supplies at the pharmacy within the next 1-3 days. If you use inhalers (even only as needed), please bring them with you on the day of your procedure.   

## 2020-03-25 NOTE — Progress Notes (Signed)
HISTORY OF PRESENT ILLNESS:  Stephanie Armstrong is a 64 y.o. female, from [place], [employer], who presents today for evaluation of problems with persistent worsening chronic diarrhea.  I saw the patient in May 2014 for routine screening colonoscopy.  The examination was normal.  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  See that dictation for details.  She tells me that her symptom complex but without change until about 4 to 5 months ago when she began to develop "diarrhea all of the time".  She also reported a nocturnal component.  Rarely any form to her bowels.  Significant urgency.  This has affected her travel schedule.  She wears protective undergarments out of the house.  Symptoms seem worse in the morning and after meals.  Lomotil helps some.  She has had 6 pound weight loss in 2 months.  She denies abdominal pain or rectal bleeding.  She has noticed oily stools.  No history of pancreatitis.  Blood work from January 01, 2020 shows normal comprehensive metabolic panel, normal lipid profile, and normal CBC with hemoglobin 14.1.  She has taken a probiotic without change.  Her medications are stable.  She has not been vaccinated against Covid  REVIEW OF SYSTEMS:  All non-GI ROS negative unless otherwise stated in the HPI except for sleeping problems, anxiety, back pain, depression  Past Medical History:  Diagnosis Date  . Anxiety   . Arthritis    fingers  . 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 Hepatitis    age 47 , unknown type  . History of vulvar dysplasia    VIN 2  . Hypertension   . IBS (irritable bowel syndrome)   . Osteopenia 2018  . Rectocele   . Urethral pain    mass  . Urinary incontinence   . VAIN II (vaginal intraepithelial  neoplasia grade II) dx 08/2015   right and left labia minora. This is really VIN II and not VAIN II.   Marland Kitchen Wears contact lenses     Past Surgical History:  Procedure Laterality Date  . ABDOMINAL SACROCOLPOPEXY N/A 03/28/2016   Procedure: ABDOMINO SACROCOLPOPEXY;  Surgeon: Nunzio Cobbs, MD;  Location: [facility];  Service: Gynecology;  Laterality: N/A;  . ANTERIOR AND POSTERIOR REPAIR N/A 03/28/2016   Procedure: ANTERIOR (CYSTOCELE) AND POSTERIOR REPAIR (RECTOCELE);  Surgeon: Nunzio Cobbs, MD;  Location: [facility];  Service: Gynecology;  Laterality: N/A;  . AUGMENTATION MAMMAPLASTY  APRIL 2013   BREAST IMPLANT CHANGED  . BILATERAL SALPINGECTOMY Bilateral 03/28/2016   Procedure: BILATERAL SALPINGECTOMY;  Surgeon: Nunzio Cobbs, MD;  Location: [facility];  Service: Gynecology;  Laterality: Bilateral;  . BLADDER SUSPENSION N/A 03/28/2016   Procedure: TRANSVAGINAL TAPE (TVT) PROCEDURE exact midurethral sling;  Surgeon: Nunzio Cobbs, MD;  Location: [facility];  Service: Gynecology;  Laterality: N/A;  . COLONOSCOPY    . CYSTOSCOPY N/A 03/28/2016   Procedure: CYSTOSCOPY;  Surgeon: Nunzio Cobbs, MD;  Location: [facility];  Service: Gynecology;  Laterality: N/A;  . CYSTOSCOPY WITH BIOPSY Bilateral 11/03/2015   Procedure: CYSTOSCOPY WITH URETHRAL BIOPSY, FULGERATION, BILATERAL RETROGRADE PYELOGRAMS;  Surgeon: Alexis Frock, MD;  Location: [facility];  Service: Urology;  Laterality: Bilateral;  .  LYSIS OF ADHESION N/A 03/28/2016   Procedure: LYSIS OF ADHESION;  Surgeon: Nunzio Cobbs, MD;  Location: [facility];  Service: Gynecology;  Laterality: N/A;  . MASS EXCISION N/A 11/03/2015   Procedure: EXCISION  URETHRAL MASS;  Surgeon: Alexis Frock, MD;  Location: [facility];  Service: Urology;  Laterality: N/A;  . TUBAL LIGATION    . VAGINAL HYSTERECTOMY  1999    Social History TINISHA ETZKORN  reports that she has never  smoked. She has never used smokeless tobacco. She reports current alcohol use of about 14.0 - 16.0 standard drinks of alcohol per week. She reports that she does not use drugs.  family history includes Breast cancer in her paternal grandmother; Cancer in her father and sister; Diabetes in her father; Heart disease in her father; Heart failure in her mother; Hypertension in her father and mother; Osteoporosis in her mother; Paget's disease of bone in her mother.  Allergies  Allergen Reactions  . Amoxicillin-Pot Clavulanate Diarrhea  . Codeine Nausea Only       PHYSICAL EXAMINATION: Vital signs: BP 122/80   Pulse 70   Ht 5\' 6"  (1.676 m)   Wt 149 lb (67.6 kg)   SpO2 97%   BMI 24.05 kg/m   Constitutional: generally well-appearing, no acute distress Psychiatric: alert and oriented x3, cooperative Eyes: extraocular movements intact, anicteric, conjunctiva pink Mouth: oral pharynx moist, no lesions Neck: supple no lymphadenopathy Cardiovascular: heart regular rate and rhythm, no murmur Lungs: clear to auscultation bilaterally Abdomen: soft, nontender, nondistended, no obvious ascites, no peritoneal signs, normal bowel sounds, no organomegaly Rectal: Omitted Extremities: no clubbing, cyanosis, or lower extremity edema bilaterally Skin: no lesions on visible extremities Neuro: No focal deficits.  Cranial nerves intact  ASSESSMENT:  1.  Chronic diarrhea and weight loss.  Elements of stearrhea by history.  Rule out malabsorptive disorder.  Rule out microscopic colitis.  Rule out chronic infectious process. 2.  Colonoscopy 2014 was normal 3.  History of diarrhea predominant IBS  PLAN:  1.  Screening blood work for celiac disease.  We will contact patient with the results 2.  Stool studies to include fecal elastase and GI pathogen panel.  We will contact patient with the results 3.  Colonoscopy with biopsies to rule out microscopic colitis.The nature of the procedure, as well as the  risks, benefits, and alternatives were carefully and thoroughly reviewed with the patient. Ample time for discussion and questions allowed. The patient understood, was satisfied, and agreed to proceed. 4.  Consider empiric trial of pancreatic enzymes pending the above work-up 5.  Okay to use Lomotil for diarrhea

## 2020-03-25 NOTE — Addendum Note (Signed)
Addended by: Boris Lown B on: 03/25/2020 04:05 PM   Modules accepted: Orders

## 2020-03-26 ENCOUNTER — Other Ambulatory Visit: Payer: HMO

## 2020-03-26 DIAGNOSIS — R197 Diarrhea, unspecified: Secondary | ICD-10-CM | POA: Diagnosis not present

## 2020-03-26 LAB — TISSUE TRANSGLUTAMINASE, IGA: (tTG) Ab, IgA: <1 U/mL

## 2020-03-26 LAB — IGA: Immunoglobulin A: 205 mg/dL (ref 70–320)

## 2020-03-30 ENCOUNTER — Other Ambulatory Visit: Payer: Self-pay

## 2020-03-30 ENCOUNTER — Ambulatory Visit
Admission: RE | Admit: 2020-03-30 | Discharge: 2020-03-30 | Disposition: A | Discharge status: Home / Self Care | Payer: PPO | Source: Ambulatory Visit | Attending: Obstetrics and Gynecology | Admitting: Obstetrics and Gynecology

## 2020-03-30 DIAGNOSIS — M85852 Other specified disorders of bone density and structure, left thigh: Secondary | ICD-10-CM | POA: Diagnosis not present

## 2020-03-30 DIAGNOSIS — Z1231 Encounter for screening mammogram for malignant neoplasm of breast: Secondary | ICD-10-CM | POA: Diagnosis not present

## 2020-03-30 DIAGNOSIS — Z780 Asymptomatic menopausal state: Secondary | ICD-10-CM | POA: Diagnosis not present

## 2020-03-30 LAB — GI PROFILE, STOOL, PCR

## 2020-04-01 LAB — PANCREATIC ELASTASE, FECAL: Pancreatic Elastase-1, Stool: >500 ug/g

## 2020-04-07 DIAGNOSIS — R2232 Localized swelling, mass and lump, left upper limb: Secondary | ICD-10-CM | POA: Diagnosis not present

## 2020-04-19 ENCOUNTER — Encounter: Payer: Self-pay | Admitting: Internal Medicine

## 2020-04-28 ENCOUNTER — Other Ambulatory Visit: Payer: Self-pay | Admitting: Internal Medicine

## 2020-04-28 DIAGNOSIS — Z1159 Encounter for screening for other viral diseases: Secondary | ICD-10-CM | POA: Diagnosis not present

## 2020-04-28 DIAGNOSIS — B078 Other viral warts: Secondary | ICD-10-CM | POA: Diagnosis not present

## 2020-04-28 DIAGNOSIS — L570 Actinic keratosis: Secondary | ICD-10-CM | POA: Diagnosis not present

## 2020-04-28 DIAGNOSIS — D485 Neoplasm of uncertain behavior of skin: Secondary | ICD-10-CM | POA: Diagnosis not present

## 2020-04-29 LAB — SARS CORONAVIRUS 2 (TAT 6-24 HRS): SARS Coronavirus 2: NEGATIVE

## 2020-04-30 ENCOUNTER — Encounter: Payer: Self-pay | Admitting: Internal Medicine

## 2020-04-30 ENCOUNTER — Ambulatory Visit (AMBULATORY_SURGERY_CENTER): Payer: PPO | Admitting: Internal Medicine

## 2020-04-30 ENCOUNTER — Other Ambulatory Visit: Payer: Self-pay

## 2020-04-30 VITALS — BP 143/87 | HR 63 | Temp 97.3°F | Resp 19 | Ht 66.0 in | Wt 149.0 lb

## 2020-04-30 DIAGNOSIS — K580 Irritable bowel syndrome with diarrhea: Secondary | ICD-10-CM

## 2020-04-30 DIAGNOSIS — R197 Diarrhea, unspecified: Secondary | ICD-10-CM

## 2020-04-30 DIAGNOSIS — K5730 Diverticulosis of large intestine without perforation or abscess without bleeding: Secondary | ICD-10-CM | POA: Diagnosis not present

## 2020-04-30 MED ORDER — SODIUM CHLORIDE 0.9 % IV SOLN: 500.0000 mL | Freq: Once | INTRAVENOUS | Status: DC

## 2020-04-30 NOTE — Progress Notes (Signed)
pt tolerated well. VSS. awake and to recovery. Report given to RN.  

## 2020-04-30 NOTE — Patient Instructions (Signed)
Please read handouts provided. Continue present medications. Await pathology results. Continue to avoid artificial sweeteners.      YOU HAD AN ENDOSCOPIC PROCEDURE TODAY AT [facility]:   Refer to the procedure report that was given to you for any specific questions about what was found during the examination.  If the procedure report does not answer your questions, please call your gastroenterologist to clarify.  If you requested that your care partner not be given the details of your procedure findings, then the procedure report has been included in a sealed envelope for you to review at your convenience later.  YOU SHOULD EXPECT: Some feelings of bloating in the abdomen. Passage of more gas than usual.  Walking can help get rid of the air that was put into your GI tract during the procedure and reduce the bloating. If you had a lower endoscopy (such as a colonoscopy or flexible sigmoidoscopy) you may notice spotting of blood in your stool or on the toilet paper. If you underwent a bowel prep for your procedure, you may not have a normal bowel movement for a few days.  Please Note:  You might notice some irritation and congestion in your nose or some drainage.  This is from the oxygen used during your procedure.  There is no need for concern and it should clear up in a day or so.  SYMPTOMS TO REPORT IMMEDIATELY:   Following lower endoscopy (colonoscopy or flexible sigmoidoscopy):  Excessive amounts of blood in the stool  Significant tenderness or worsening of abdominal pains  Swelling of the abdomen that is new, acute  Fever of 100F or higher   For urgent or emergent issues, a gastroenterologist can be reached at any hour by calling [phone]. Do not use MyChart messaging for urgent concerns.    DIET:  We do recommend a small meal at first, but then you may proceed to your regular diet.  Drink plenty of fluids but you should avoid alcoholic beverages for 24  hours.  ACTIVITY:  You should plan to take it easy for the rest of today and you should NOT DRIVE or use heavy machinery until tomorrow (because of the sedation medicines used during the test).    FOLLOW UP: Our staff will call the number listed on your records 48-72 hours following your procedure to check on you and address any questions or concerns that you may have regarding the information given to you following your procedure. If we do not reach you, we will leave a message.  We will attempt to reach you two times.  During this call, we will ask if you have developed any symptoms of COVID 19. If you develop any symptoms (ie: fever, flu-like symptoms, shortness of breath, cough etc.) before then, please call [phone].  If you test positive for Covid 19 in the 2 weeks post procedure, please call and report this information to [place].    If any biopsies were taken you will be contacted by phone or by letter within the next 1-3 weeks.  Please call us at [phone] if you have not heard about the biopsies in 3 weeks.    SIGNATURES/CONFIDENTIALITY: You and/or your care partner have signed paperwork which will be entered into your electronic medical record.  These signatures attest to the fact that that the information above on your After Visit Summary has been reviewed and is understood.  Full responsibility of the confidentiality of this discharge information lies with you and/or  your care-partner. 

## 2020-04-30 NOTE — Op Note (Signed)
[facility] Patient Name: Stephanie Armstrong Procedure Date: 04/30/2020 2:19 PM MRN: [id number] Endoscopist: Docia Chuck. Henrene Pastor , MD Age: 65 Referring MD:  Date of Birth: Sep 17, 1955 Gender: Female Account #: [id number] Procedure:                Colonoscopy with biopsies Indications:              Chronic diarrhea, worsening. Previous colonoscopy                            2014 was normal Medicines:                Monitored Anesthesia Care Procedure:                Pre-Anesthesia Assessment:                           - Prior to the procedure, a History and Physical                            was performed, and patient medications and                            allergies were reviewed. The patient's tolerance of                            previous anesthesia was also reviewed. The risks                            and benefits of the procedure and the sedation                            options and risks were discussed with the patient.                            All questions were answered, and informed consent                            was obtained. Prior Anticoagulants: The patient has                            taken no previous anticoagulant or antiplatelet                            agents. ASA Grade Assessment: II - A patient with                            mild systemic disease. After reviewing the risks                            and benefits, the patient was deemed in                            satisfactory condition to undergo the procedure.  After obtaining informed consent, the colonoscope                            was passed under direct vision. Throughout the                            procedure, the patient's blood pressure, pulse, and                            oxygen saturations were monitored continuously. The                            Colonoscope was introduced through the anus and                            advanced to the the cecum,  identified by                            appendiceal orifice and ileocecal valve. The                            ileocecal valve, appendiceal orifice, and rectum                            were photographed. The quality of the bowel                            preparation was excellent. The colonoscopy was                            performed without difficulty. The patient tolerated                            the procedure well. The bowel preparation used was                            SUPREP via split dose instruction. Scope In: 2:32:59 PM Scope Out: 2:49:06 PM Scope Withdrawal Time: 0 hours 8 minutes 31 seconds  Total Procedure Duration: 0 hours 16 minutes 7 seconds  Findings:                 A few small-mouthed diverticula were found in the                            sigmoid colon.                           The entire examined colon appeared otherwise normal                            on direct and retroflexion views. Biopsies for                            histology were taken with a cold forceps from the  entire colon for evaluation of microscopic colitis. Complications:            No immediate complications. Estimated blood loss:                            None. Estimated Blood Loss:     Estimated blood loss: none. Impression:               - Diverticulosis in the sigmoid colon.                           - The entire examined colon is otherwise normal on                            direct and retroflexion views. Recommendation:           - Repeat colonoscopy in 10 years for screening                            purposes.                           - Patient has a contact number available for                            emergencies. The signs and symptoms of potential                            delayed complications were discussed with the                            patient. Return to normal activities tomorrow.                            Written discharge  instructions were provided to the                            patient.                           - Resume previous diet.                           - Continue present medications.                           - Await pathology results.                           - CONTINUE TO AVOID ARTIFICIAL SWEETENERS as this                            is helping your diarrhea. Docia Chuck. Henrene Pastor, MD 04/30/2020 2:56:40 PM This report has been signed electronically.

## 2020-04-30 NOTE — Progress Notes (Signed)
Medical history reviewed with no changes noted. VS assessed by A.G

## 2020-04-30 NOTE — Progress Notes (Signed)
Called to room to assist during endoscopic procedure.  Patient ID and intended procedure confirmed with present staff. Received instructions for my participation in the procedure from the performing physician.  

## 2020-05-04 ENCOUNTER — Telehealth: Payer: Self-pay

## 2020-05-04 NOTE — Telephone Encounter (Signed)
Covid-19 screening questions   Do you now or have you had a fever in the last 14 days? No.  Do you have any respiratory symptoms of shortness of breath or cough now or in the last 14 days? No.  Do you have any family members or close contacts with diagnosed or suspected Covid-19 in the past 14 days? No. Have you been tested for Covid-19 and found to be positive? No.       Follow up Call-  Call back number 04/30/2020  Post procedure Call Back phone  # [phone]  Permission to leave phone message Yes  Some recent data might be hidden     Patient questions:  Do you have a fever, pain , or abdominal swelling? No. Pain Score  0 *  Have you tolerated food without any problems? Yes.    Have you been able to return to your normal activities? Yes.    Do you have any questions about your discharge instructions: Diet   No. Medications  No. Follow up visit  No.  Do you have questions or concerns about your Care? No.  Actions: * If pain score is 4 or above: No action needed, pain <4.

## 2020-05-05 ENCOUNTER — Ambulatory Visit (INDEPENDENT_AMBULATORY_CARE_PROVIDER_SITE_OTHER): Payer: PPO | Admitting: Obstetrics and Gynecology

## 2020-05-05 ENCOUNTER — Other Ambulatory Visit: Payer: Self-pay

## 2020-05-05 ENCOUNTER — Other Ambulatory Visit (HOSPITAL_COMMUNITY)
Admission: RE | Admit: 2020-05-05 | Discharge: 2020-05-05 | Disposition: A | Discharge status: Home / Self Care | Payer: PPO | Source: Ambulatory Visit | Attending: Obstetrics and Gynecology | Admitting: Obstetrics and Gynecology

## 2020-05-05 ENCOUNTER — Encounter: Payer: Self-pay | Admitting: Obstetrics and Gynecology

## 2020-05-05 VITALS — BP 120/76 | HR 66 | Ht 66.0 in | Wt 154.0 lb

## 2020-05-05 DIAGNOSIS — Z87412 Personal history of vulvar dysplasia: Secondary | ICD-10-CM | POA: Diagnosis not present

## 2020-05-05 DIAGNOSIS — R87610 Atypical squamous cells of undetermined significance on cytologic smear of cervix (ASC-US): Secondary | ICD-10-CM | POA: Insufficient documentation

## 2020-05-05 DIAGNOSIS — Z8619 Personal history of other infectious and parasitic diseases: Secondary | ICD-10-CM | POA: Insufficient documentation

## 2020-05-05 DIAGNOSIS — Z1151 Encounter for screening for human papillomavirus (HPV): Secondary | ICD-10-CM | POA: Diagnosis not present

## 2020-05-05 DIAGNOSIS — Z01419 Encounter for gynecological examination (general) (routine) without abnormal findings: Secondary | ICD-10-CM | POA: Diagnosis not present

## 2020-05-05 DIAGNOSIS — Z780 Asymptomatic menopausal state: Secondary | ICD-10-CM | POA: Diagnosis not present

## 2020-05-05 DIAGNOSIS — Z1239 Encounter for other screening for malignant neoplasm of breast: Secondary | ICD-10-CM | POA: Diagnosis not present

## 2020-05-05 DIAGNOSIS — N952 Postmenopausal atrophic vaginitis: Secondary | ICD-10-CM | POA: Diagnosis not present

## 2020-05-05 MED ORDER — ESTRADIOL 0.1 MG/GM VA CREA
TOPICAL_CREAM | VAGINAL | 1 refills | Status: DC
Start: 2020-05-05 — End: 2021-05-11

## 2020-05-05 NOTE — Progress Notes (Signed)
65 y.o. G21P3 Widowed Caucasian female here for follow up of her prolapse, vaginal atrophy, history of HPV, vulvar dysplasia, and osteopenia.  She is also due for her screening breast and pelvic exam.   Having some vaginal dryness.  Using vaginal estrogen cream twice a week.   Dealing with diarrhea, which has improved.  Just had colonoscopy done.  Awaiting biopsy results.  Has lomotil available.   Good bladder control.   Not vaccinated against Covid.  Going to [place] to see her new grandchild.   No LMP recorded. Patient has had a hysterectomy.           Sexually active: No.  The current method of family planning is status post hysterectomy.    Exercising: No.  does some walking and recumbent bike, some weights Smoker:  no  Health Maintenance: Pap: 01-30-18 Neg:Neg HR HPV, 01-15-17 Neg:Neg HR HPV, 10-11-15 ASCUS:Pos HR HPV History of abnormal Pap:  Yes, 10-11-15 ASCUS:Pos HR HPV--Colposcopy of vagina and vulva on 10/11/15--Bx of right and left vaginal apices showed no atypia or dysplasia. Biopsies of the right and left labia majora also negative for atypia and dysplasia. 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    reports that she has never smoked. She has never used smokeless tobacco. She reports current alcohol use of about 14.0 - 16.0 standard drinks of alcohol per week. She reports that she does not use drugs.  Past Medical History:  Diagnosis Date  . Allergy   . Anxiety   . Arthritis    fingers  . 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 Hepatitis    age 47 , unknown type  . History of vulvar dysplasia    VIN 2  . Hypertension   . IBS (irritable bowel syndrome)   .  Osteopenia 2018  . Rectocele   . Urethral pain    mass  . Urinary incontinence   . 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 This is really VIN II and not VAIN II.   Marland Kitchen Wears contact lenses     Past Surgical History:  Procedure Laterality Date  . ABDOMINAL SACROCOLPOPEXY N/A 03/28/2016   Procedure: ABDOMINO SACROCOLPOPEXY;  Surgeon: Nunzio Cobbs, MD;  Location: [facility];  Service: Gynecology;  Laterality: N/A;  . ANTERIOR AND POSTERIOR REPAIR N/A 03/28/2016   Procedure: ANTERIOR (CYSTOCELE) AND POSTERIOR REPAIR (RECTOCELE);  Surgeon: Nunzio Cobbs, MD;  Location: [facility];  Service: Gynecology;  Laterality: N/A;  . AUGMENTATION MAMMAPLASTY  APRIL 2013   BREAST IMPLANT CHANGED  . BILATERAL SALPINGECTOMY Bilateral 03/28/2016   Procedure: BILATERAL SALPINGECTOMY;  Surgeon: Nunzio Cobbs, MD;  Location: [facility];  Service: Gynecology;  Laterality: Bilateral;  . BLADDER SUSPENSION N/A 03/28/2016   Procedure: TRANSVAGINAL TAPE (TVT) PROCEDURE exact midurethral sling;  Surgeon: Nunzio Cobbs, MD;  Location: [facility];  Service: Gynecology;  Laterality: N/A;  . COLONOSCOPY    . CYSTOSCOPY N/A 03/28/2016   Procedure: CYSTOSCOPY;  Surgeon: Nunzio Cobbs, MD;  Location: [facility];  Service: Gynecology;  Laterality: N/A;  . CYSTOSCOPY  WITH BIOPSY Bilateral 11/03/2015   Procedure: CYSTOSCOPY WITH URETHRAL BIOPSY, FULGERATION, BILATERAL RETROGRADE PYELOGRAMS;  Surgeon: Alexis Frock, MD;  Location: [facility];  Service: Urology;  Laterality: Bilateral;  . LYSIS OF ADHESION N/A 03/28/2016   Procedure: LYSIS OF ADHESION;  Surgeon: Nunzio Cobbs, MD;  Location: [facility];  Service: Gynecology;  Laterality: N/A;  . MASS EXCISION N/A 11/03/2015   Procedure: EXCISION  URETHRAL MASS;  Surgeon: Alexis Frock, MD;  Location: [facility];  Service: Urology;  Laterality: N/A;  .  TUBAL LIGATION    . VAGINAL HYSTERECTOMY  1999    Current Outpatient Medications  Medication Sig Dispense Refill  . ALPRAZolam (XANAX) 0.5 MG tablet Take 1 tab p.o. qhs for 2 weeks, then take 1/2 tab p.o. qhs, then take 1/2 tab qod, then discontinue. 30 tablet 0  . amLODipine-benazepril (LOTREL) 5-40 MG capsule TAKE (1) CAPSULE BY MOUTH ONCE DAILY. 30 capsule 5  . Ascorbic Acid (VITAMIN C) 100 MG tablet Take 100 mg by mouth daily.    . Biotin 1000 MCG tablet Take 1,000 mcg by mouth daily.    . diphenoxylate-atropine (LOMOTIL) 2.5-0.025 MG tablet TAKE 2 TABLETS BY MOUTH 4 TIMES DAILY AS NEEDED FOR DIARRHEA OR LOOSE STOOLS. MAX 8 TABLETS IN 24 HOURS. 30 tablet 0  . estradiol (ESTRACE VAGINAL) 0.1 MG/GM vaginal cream Place 1/2 gram vaginally at hs two to three times per week as needed. 42.5 g 0  . hydrochlorothiazide (HYDRODIURIL) 25 MG tablet TAKE ONE TABLET BY MOUTH ONCE DAILY AS NEEDED FOR SWELLING. DO NOT EXCEED 3 PER WEEK. 30 tablet 5  . ibuprofen (ADVIL) 800 MG tablet Take 1 tablet (800 mg total) by mouth every 8 (eight) hours as needed. 30 tablet 0  . lactobacillus acidophilus (BACID) TABS tablet Take 2 tablets by mouth 3 (three) times daily.     No current facility-administered medications for this visit.    Family History  Problem Relation Age of Onset  . Hypertension Mother   . Osteoporosis Mother   . Paget's disease of bone Mother   . Heart failure Mother   . Cancer Father        MELANOMA  . Diabetes Father   . Hypertension Father   . Heart disease Father   . Cancer Sister        THYROID  . Breast cancer Paternal Grandmother   . Colon cancer Neg Hx   . Pancreatic cancer Neg Hx   . Esophageal cancer Neg Hx     Review of Systems  All other systems reviewed and are negative.   Exam:   BP 120/76   Pulse 66   Ht 5\' 6"  (1.676 m)   Wt 154 lb (69.9 kg)   SpO2 98%   BMI 24.86 kg/m     General appearance: alert, cooperative and appears stated age Head: normocephalic,  without obvious abnormality, atraumatic Neck: no adenopathy, supple, symmetrical, trachea midline and thyroid normal to inspection and palpation Lungs: clear to auscultation bilaterally Breasts: bilateral implants, normal appearance, no masses or tenderness, No nipple retraction or dimpling, No nipple discharge or bleeding, No axillary adenopathy Heart: regular rate and rhythm Abdomen: soft, non-tender; no masses, no organomegaly Extremities: extremities normal, atraumatic, no cyanosis or edema Skin: skin color, texture, turgor normal. No rashes or lesions Lymph nodes: cervical, supraclavicular, and axillary nodes normal. Neurologic: grossly normal  Pelvic: External genitalia:  no lesions.  Scar noted of the perineum.  No abnormal inguinal nodes palpated.              Urethra:  normal appearing urethra with no masses, tenderness or lesions              Bartholins and Skenes: normal                 Vagina: normal appearing vagina with normal color and discharge, no lesions.  Good support.               Cervix: absent.              Pap taken: Yes.   Bimanual Exam:  Uterus:  Absent.               Adnexa: no mass, fullness, tenderness              Rectal exam: Yes.  .  Confirms.              Anus:  normal sphincter tone, no lesions  Chaperone was present for exam.  Assessment:    Status post TVH.  Status post abdominosacrocolpopexy and anterior and posterior repair, TVT/cysto.Good support. Hx VIN II.No signs of recurrence. Hx ASCUS and positive HR HPV of vagina. Status post excision of urethral mass. Condyloma. Screening breast and pelvic exam.  Menopausal female.  Vaginal atrophy.  Bilateral breast implants. Osteopenia. Fecal urgency and diarrhea.   Plan: Mammogram screening discussed. Self breast awareness reviewed. Pap and reflex HR HPV collected.  Guidelines for Calcium, Vitamin D, regular exercise program including cardiovascular and weight bearing  exercise. Refill of vaginal estrogen cream.  I discussed potential effect on breast cancer.  BMD in 2 years.  We discussed Covid vaccination.  Follow up annually and prn.    33 min  total time was spent for this patient encounter, including preparation, face-to-face counseling with the patient, coordination of care, and documentation of the encounter.

## 2020-05-06 ENCOUNTER — Encounter: Payer: Self-pay | Admitting: Internal Medicine

## 2020-05-10 LAB — CYTOLOGY - PAP
Comment: NEGATIVE
Diagnosis: UNDETERMINED — AB
High risk HPV: NEGATIVE

## 2020-08-03 DIAGNOSIS — J029 Acute pharyngitis, unspecified: Secondary | ICD-10-CM | POA: Diagnosis not present

## 2020-08-03 DIAGNOSIS — F411 Generalized anxiety disorder: Secondary | ICD-10-CM | POA: Diagnosis not present

## 2020-09-27 DIAGNOSIS — E663 Overweight: Secondary | ICD-10-CM | POA: Diagnosis not present

## 2020-09-27 DIAGNOSIS — Z1331 Encounter for screening for depression: Secondary | ICD-10-CM | POA: Diagnosis not present

## 2020-09-27 DIAGNOSIS — R5383 Other fatigue: Secondary | ICD-10-CM | POA: Diagnosis not present

## 2020-09-27 DIAGNOSIS — R635 Abnormal weight gain: Secondary | ICD-10-CM | POA: Diagnosis not present

## 2020-09-27 DIAGNOSIS — Z1389 Encounter for screening for other disorder: Secondary | ICD-10-CM | POA: Diagnosis not present

## 2020-09-27 DIAGNOSIS — Z6825 Body mass index (BMI) 25.0-25.9, adult: Secondary | ICD-10-CM | POA: Diagnosis not present

## 2020-11-03 ENCOUNTER — Other Ambulatory Visit: Payer: Self-pay | Admitting: Family Medicine

## 2020-11-03 DIAGNOSIS — I10 Essential (primary) hypertension: Secondary | ICD-10-CM

## 2020-11-05 ENCOUNTER — Other Ambulatory Visit: Payer: Self-pay

## 2020-11-05 DIAGNOSIS — I10 Essential (primary) hypertension: Secondary | ICD-10-CM

## 2020-11-05 MED ORDER — AMLODIPINE BESY-BENAZEPRIL HCL 5-40 MG PO CAPS: ORAL_CAPSULE | ORAL | 0 refills | Status: AC

## 2020-11-05 MED ORDER — HYDROCHLOROTHIAZIDE 25 MG PO TABS: ORAL_TABLET | ORAL | 0 refills | Status: AC

## 2020-11-19 DIAGNOSIS — U071 COVID-19: Secondary | ICD-10-CM | POA: Diagnosis not present

## 2020-12-07 DIAGNOSIS — Z6825 Body mass index (BMI) 25.0-25.9, adult: Secondary | ICD-10-CM | POA: Diagnosis not present

## 2020-12-07 DIAGNOSIS — K219 Gastro-esophageal reflux disease without esophagitis: Secondary | ICD-10-CM | POA: Diagnosis not present

## 2020-12-07 DIAGNOSIS — E663 Overweight: Secondary | ICD-10-CM | POA: Diagnosis not present

## 2020-12-07 DIAGNOSIS — U099 Post covid-19 condition, unspecified: Secondary | ICD-10-CM | POA: Diagnosis not present

## 2020-12-07 DIAGNOSIS — J209 Acute bronchitis, unspecified: Secondary | ICD-10-CM | POA: Diagnosis not present

## 2020-12-07 DIAGNOSIS — I10 Essential (primary) hypertension: Secondary | ICD-10-CM | POA: Diagnosis not present

## 2020-12-08 DIAGNOSIS — Z411 Encounter for cosmetic surgery: Secondary | ICD-10-CM | POA: Diagnosis not present

## 2020-12-08 DIAGNOSIS — L814 Other melanin hyperpigmentation: Secondary | ICD-10-CM | POA: Diagnosis not present

## 2020-12-08 DIAGNOSIS — L821 Other seborrheic keratosis: Secondary | ICD-10-CM | POA: Diagnosis not present

## 2020-12-08 DIAGNOSIS — B079 Viral wart, unspecified: Secondary | ICD-10-CM | POA: Diagnosis not present

## 2020-12-08 DIAGNOSIS — L578 Other skin changes due to chronic exposure to nonionizing radiation: Secondary | ICD-10-CM | POA: Diagnosis not present

## 2020-12-08 DIAGNOSIS — D1801 Hemangioma of skin and subcutaneous tissue: Secondary | ICD-10-CM | POA: Diagnosis not present

## 2020-12-08 DIAGNOSIS — L570 Actinic keratosis: Secondary | ICD-10-CM | POA: Diagnosis not present

## 2021-01-10 DIAGNOSIS — L209 Atopic dermatitis, unspecified: Secondary | ICD-10-CM | POA: Diagnosis not present

## 2021-01-10 DIAGNOSIS — B079 Viral wart, unspecified: Secondary | ICD-10-CM | POA: Diagnosis not present

## 2021-01-18 DIAGNOSIS — F411 Generalized anxiety disorder: Secondary | ICD-10-CM | POA: Diagnosis not present

## 2021-01-18 DIAGNOSIS — G4709 Other insomnia: Secondary | ICD-10-CM | POA: Diagnosis not present

## 2021-01-20 DIAGNOSIS — K120 Recurrent oral aphthae: Secondary | ICD-10-CM | POA: Diagnosis not present

## 2021-02-14 DIAGNOSIS — J209 Acute bronchitis, unspecified: Secondary | ICD-10-CM | POA: Diagnosis not present

## 2021-02-14 DIAGNOSIS — I10 Essential (primary) hypertension: Secondary | ICD-10-CM | POA: Diagnosis not present

## 2021-02-14 DIAGNOSIS — J9801 Acute bronchospasm: Secondary | ICD-10-CM | POA: Diagnosis not present

## 2021-02-15 ENCOUNTER — Other Ambulatory Visit (HOSPITAL_COMMUNITY): Payer: Self-pay | Admitting: Internal Medicine

## 2021-02-15 DIAGNOSIS — R059 Cough, unspecified: Secondary | ICD-10-CM

## 2021-02-16 ENCOUNTER — Ambulatory Visit (HOSPITAL_COMMUNITY)
Admission: RE | Admit: 2021-02-16 | Discharge: 2021-02-16 | Disposition: A | Discharge status: Home / Self Care | Payer: PPO | Source: Ambulatory Visit | Attending: Internal Medicine | Admitting: Internal Medicine

## 2021-02-16 ENCOUNTER — Other Ambulatory Visit: Payer: Self-pay

## 2021-02-16 DIAGNOSIS — R059 Cough, unspecified: Secondary | ICD-10-CM | POA: Insufficient documentation

## 2021-05-10 NOTE — Progress Notes (Signed)
66 y.o. G52P3 Widowed Caucasian female here for annual exam.   Patient is followed for prolapse, vaginal atrophy, history of HPV, vulvar dysplasia, and osteopenia.   She uses vaginal estrogen cream twice a week.  Vagina feels dry.   Periodically sexually active.   Worrying and not sleeping well. Would like assistance with a counselor for potential depression. Uses Ambien at night.  Takes Xanax as needed during the day.  Not suicidal.  PCP:  Collene Mares, PA-c   No LMP recorded. Patient has had a hysterectomy.           Sexually active: No.  The current method of family planning is status post hysterectomy.    Exercising: Yes.     Walking, light weights, abdominal exercises Smoker:  no  Health Maintenance: Pap:  05-05-20 ASCUS:Neg HR HPV, 01-30-18 Neg:Neg HR HPV, 01-15-17 Neg:Neg HR HPV History of abnormal Pap:  Yes,  10-11-15 ASCUS:Pos HR HPV-- Colposcopy of vagina and vulva on 10/11/15--  Bx of right and left vaginal apices showed no atypia or dysplasia.  Biopsies of the right and left labia majora also negative for atypia and dysplasia.  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 Knows to schedule Colonoscopy:  04-30-20 normal;10 years BMD:  03-30-20  Result :Osteopenia of hip TDaP:  PCP Gardasil:   no HIV:no Hep C:no Screening Labs:  PCP   reports that she has never smoked. She has never used smokeless tobacco. She reports current alcohol use of about 8.0 standard drinks per week. She reports that she does not use drugs.  Past Medical History:  Diagnosis Date   Allergy    Anxiety    Arthritis    fingers   ASCUS with positive high risk human papillomavirus of vagina dx 10/11/15   Cancer ([facility])    basal cell skin   Hepatitis    age 72 , unknown type   History of vulvar dysplasia    VIN 2   Hypertension    IBS (irritable bowel syndrome)    Osteopenia 2018   Rectocele    Urethral pain    mass   Urinary incontinence    VAIN II (vaginal  intraepithelial neoplasia grade II) dx 08/2015   right and left labia minora. This is really VIN II and not VAIN II.    Wears contact lenses     Past Surgical History:  Procedure Laterality Date   ABDOMINAL SACROCOLPOPEXY N/A 03/28/2016   Procedure: ABDOMINO SACROCOLPOPEXY;  Surgeon: Nunzio Cobbs, MD;  Location: [facility];  Service: Gynecology;  Laterality: N/A;   ANTERIOR AND POSTERIOR REPAIR N/A 03/28/2016   Procedure: ANTERIOR (CYSTOCELE) AND POSTERIOR REPAIR (RECTOCELE);  Surgeon: Nunzio Cobbs, MD;  Location: [facility];  Service: Gynecology;  Laterality: N/A;   AUGMENTATION MAMMAPLASTY  APRIL 2013   BREAST IMPLANT CHANGED   BILATERAL SALPINGECTOMY Bilateral 03/28/2016   Procedure: BILATERAL SALPINGECTOMY;  Surgeon: Nunzio Cobbs, MD;  Location: [facility];  Service: Gynecology;  Laterality: Bilateral;   BLADDER SUSPENSION N/A 03/28/2016   Procedure: TRANSVAGINAL TAPE (TVT) PROCEDURE exact midurethral sling;  Surgeon: Nunzio Cobbs, MD;  Location: [facility];  Service: Gynecology;  Laterality: N/A;   COLONOSCOPY     CYSTOSCOPY N/A 03/28/2016   Procedure: CYSTOSCOPY;  Surgeon: Nunzio Cobbs, MD;  Location: [facility];  Service: Gynecology;  Laterality: N/A;   CYSTOSCOPY WITH BIOPSY Bilateral 11/03/2015   Procedure: CYSTOSCOPY  WITH URETHRAL BIOPSY, FULGERATION, BILATERAL RETROGRADE PYELOGRAMS;  Surgeon: Alexis Frock, MD;  Location: [facility];  Service: Urology;  Laterality: Bilateral;   LYSIS OF ADHESION N/A 03/28/2016   Procedure: LYSIS OF ADHESION;  Surgeon: Nunzio Cobbs, MD;  Location: [facility];  Service: Gynecology;  Laterality: N/A;   MASS EXCISION N/A 11/03/2015   Procedure: EXCISION  URETHRAL MASS;  Surgeon: Alexis Frock, MD;  Location: [facility];  Service: Urology;  Laterality: N/A;   TUBAL LIGATION     VAGINAL HYSTERECTOMY  1999    Current Outpatient Medications  Medication Sig Dispense Refill    ALPRAZolam (XANAX) 0.5 MG tablet Take 1 tab p.o. qhs for 2 weeks, then take 1/2 tab p.o. qhs, then take 1/2 tab qod, then discontinue. 30 tablet 0   amLODipine-benazepril (LOTREL) 5-40 MG capsule TAKE (1) CAPSULE BY MOUTH ONCE DAILY. 30 capsule 0   Ascorbic Acid (VITAMIN C) 100 MG tablet Take 100 mg by mouth daily.     diphenoxylate-atropine (LOMOTIL) 2.5-0.025 MG tablet TAKE 2 TABLETS BY MOUTH 4 TIMES DAILY AS NEEDED FOR DIARRHEA OR LOOSE STOOLS. MAX 8 TABLETS IN 24 HOURS. 30 tablet 0   estradiol (ESTRACE VAGINAL) 0.1 MG/GM vaginal cream Place 1/2 gram vaginally at hs two to three times per week as needed. 42.5 g 1   fluorouracil (EFUDEX) 5 % cream      hydrochlorothiazide (HYDRODIURIL) 25 MG tablet TAKE ONE TABLET BY MOUTH ONCE DAILY AS NEEDED. DO NOT EXCEED 3 PER WEEK. APPT REQUIRED FOR FUTURE REFILLS 30 tablet 0   ibuprofen (ADVIL) 800 MG tablet Take 1 tablet (800 mg total) by mouth every 8 (eight) hours as needed. 30 tablet 0   lactobacillus acidophilus (BACID) TABS tablet Take 2 tablets by mouth 3 (three) times daily.     tretinoin (RETIN-A) 0.05 % cream      zolpidem (AMBIEN) 10 MG tablet Take 10 mg by mouth daily.     No current facility-administered medications for this visit.    Family History  Problem Relation Age of Onset   Hypertension Mother    Osteoporosis Mother    Paget's disease of bone Mother    Heart failure Mother    Cancer Father        MELANOMA   Diabetes Father    Hypertension Father    Heart disease Father    Cancer Sister        THYROID   Breast cancer Paternal Grandmother    Colon cancer Neg Hx    Pancreatic cancer Neg Hx    Esophageal cancer Neg Hx     Review of Systems  Psychiatric/Behavioral:  Positive for dysphoric mood (depression).   All other systems reviewed and are negative.  Exam:   BP 122/60    Pulse 73    Ht 5\' 6"  (1.676 m)    Wt 159 lb (72.1 kg)    SpO2 99%    BMI 25.66 kg/m     General appearance: alert, cooperative and appears  stated age Head: normocephalic, without obvious abnormality, atraumatic Neck: no adenopathy, supple, symmetrical, trachea midline and thyroid normal to inspection and palpation Lungs: clear to auscultation bilaterally Breasts: bilateral implants, no masses or tenderness, No nipple retraction or dimpling, No nipple discharge or bleeding, No axillary adenopathy Heart: regular rate and rhythm Abdomen: soft, non-tender; no masses, no organomegaly Extremities: extremities normal, atraumatic, no cyanosis or edema Skin: skin color, texture, turgor normal. No rashes or lesions Lymph  nodes: cervical, supraclavicular, and axillary nodes normal. Neurologic: grossly normal  Pelvic: External genitalia:  no lesions              No abnormal inguinal nodes palpated.              Urethra:  normal appearing urethra with no masses, tenderness or lesions              Bartholins and Skenes: normal                 Vagina: normal appearing vagina with normal color and discharge, no lesions.  Good support.               Cervix: absent              Pap taken: no Bimanual Exam:  Uterus:  absent              Adnexa: no mass, fullness, tenderness              Rectal exam: yes.  Confirms.              Anus:  normal sphincter tone, no lesions  Chaperone was present for exam:  Estill Bamberg, CMA  Assessment:   Well woman visit with gynecologic exam. Status post TVH.  Status post abdominosacrocolpopexy and anterior and posterior repair, TVT/cysto.  Good support. Hx VIN II.  No signs of recurrence. Hx ASCUS and negative HR HPV of vagina. Status post excision of urethral mass.  Condyloma. Vaginal atrophy.  Medication monitoring encounter.  Bilateral breast implants. Osteopenia.  Anxiety and possible depression.  Plan: Mammogram screening discussed. Self breast awareness reviewed. Pap and HR HPV next year.  Guidelines for Calcium, Vitamin D, regular exercise program including cardiovascular and weight bearing  exercise. Refill of vaginal estrogen 1/2 gram pv at hs 2 - 3 times per week.  I discussed potential effect on breast health.  BMD next year.  Brochure to patient of psychologists at [facility]. Follow up annually and prn.   After visit summary provided.   27 min  total time was spent for this patient encounter, including preparation, face-to-face counseling with the patient, coordination of care, and documentation of the encounter.

## 2021-05-11 ENCOUNTER — Encounter: Payer: Self-pay | Admitting: Obstetrics and Gynecology

## 2021-05-11 ENCOUNTER — Ambulatory Visit (INDEPENDENT_AMBULATORY_CARE_PROVIDER_SITE_OTHER): Payer: PPO | Admitting: Obstetrics and Gynecology

## 2021-05-11 ENCOUNTER — Other Ambulatory Visit: Payer: Self-pay

## 2021-05-11 VITALS — BP 122/60 | HR 73 | Ht 66.0 in | Wt 159.0 lb

## 2021-05-11 DIAGNOSIS — Z5181 Encounter for therapeutic drug level monitoring: Secondary | ICD-10-CM

## 2021-05-11 DIAGNOSIS — Z01419 Encounter for gynecological examination (general) (routine) without abnormal findings: Secondary | ICD-10-CM | POA: Diagnosis not present

## 2021-05-11 DIAGNOSIS — N952 Postmenopausal atrophic vaginitis: Secondary | ICD-10-CM | POA: Diagnosis not present

## 2021-05-11 DIAGNOSIS — M85859 Other specified disorders of bone density and structure, unspecified thigh: Secondary | ICD-10-CM | POA: Diagnosis not present

## 2021-05-11 DIAGNOSIS — Z780 Asymptomatic menopausal state: Secondary | ICD-10-CM

## 2021-05-11 DIAGNOSIS — F419 Anxiety disorder, unspecified: Secondary | ICD-10-CM

## 2021-05-11 MED ORDER — ESTRADIOL 0.1 MG/GM VA CREA: TOPICAL_CREAM | VAGINAL | 1 refills | Status: DC

## 2021-05-11 NOTE — Patient Instructions (Signed)

## 2021-05-12 ENCOUNTER — Other Ambulatory Visit: Payer: Self-pay | Admitting: Obstetrics and Gynecology

## 2021-05-12 DIAGNOSIS — Z1231 Encounter for screening mammogram for malignant neoplasm of breast: Secondary | ICD-10-CM

## 2021-05-30 ENCOUNTER — Ambulatory Visit
Admission: RE | Admit: 2021-05-30 | Discharge: 2021-05-30 | Disposition: A | Discharge status: Home / Self Care | Payer: PPO | Source: Ambulatory Visit | Attending: Obstetrics and Gynecology | Admitting: Obstetrics and Gynecology

## 2021-05-30 DIAGNOSIS — Z1231 Encounter for screening mammogram for malignant neoplasm of breast: Secondary | ICD-10-CM | POA: Diagnosis not present

## 2021-06-06 DIAGNOSIS — F411 Generalized anxiety disorder: Secondary | ICD-10-CM | POA: Diagnosis not present

## 2021-06-06 DIAGNOSIS — Z0001 Encounter for general adult medical examination with abnormal findings: Secondary | ICD-10-CM | POA: Diagnosis not present

## 2021-06-06 DIAGNOSIS — Z6825 Body mass index (BMI) 25.0-25.9, adult: Secondary | ICD-10-CM | POA: Diagnosis not present

## 2021-06-06 DIAGNOSIS — Z1331 Encounter for screening for depression: Secondary | ICD-10-CM | POA: Diagnosis not present

## 2021-06-09 DIAGNOSIS — L578 Other skin changes due to chronic exposure to nonionizing radiation: Secondary | ICD-10-CM | POA: Diagnosis not present

## 2021-06-09 DIAGNOSIS — D1801 Hemangioma of skin and subcutaneous tissue: Secondary | ICD-10-CM | POA: Diagnosis not present

## 2021-06-09 DIAGNOSIS — L570 Actinic keratosis: Secondary | ICD-10-CM | POA: Diagnosis not present

## 2021-06-09 DIAGNOSIS — L821 Other seborrheic keratosis: Secondary | ICD-10-CM | POA: Diagnosis not present

## 2021-06-09 DIAGNOSIS — Z23 Encounter for immunization: Secondary | ICD-10-CM | POA: Diagnosis not present

## 2021-06-09 DIAGNOSIS — D229 Melanocytic nevi, unspecified: Secondary | ICD-10-CM | POA: Diagnosis not present

## 2021-06-09 DIAGNOSIS — B078 Other viral warts: Secondary | ICD-10-CM | POA: Diagnosis not present

## 2021-06-09 DIAGNOSIS — L814 Other melanin hyperpigmentation: Secondary | ICD-10-CM | POA: Diagnosis not present

## 2021-06-09 DIAGNOSIS — Z411 Encounter for cosmetic surgery: Secondary | ICD-10-CM | POA: Diagnosis not present

## 2021-07-16 ENCOUNTER — Other Ambulatory Visit: Payer: Self-pay | Admitting: Family Medicine

## 2021-07-16 DIAGNOSIS — I10 Essential (primary) hypertension: Secondary | ICD-10-CM

## 2021-07-18 ENCOUNTER — Other Ambulatory Visit: Payer: Self-pay | Admitting: Family Medicine

## 2021-07-18 DIAGNOSIS — I10 Essential (primary) hypertension: Secondary | ICD-10-CM

## 2021-07-19 ENCOUNTER — Other Ambulatory Visit: Payer: Self-pay | Admitting: Family Medicine

## 2021-07-19 DIAGNOSIS — I10 Essential (primary) hypertension: Secondary | ICD-10-CM

## 2021-07-20 DIAGNOSIS — I10 Essential (primary) hypertension: Secondary | ICD-10-CM | POA: Diagnosis not present

## 2021-07-20 DIAGNOSIS — M79606 Pain in leg, unspecified: Secondary | ICD-10-CM | POA: Diagnosis not present

## 2021-07-20 DIAGNOSIS — E663 Overweight: Secondary | ICD-10-CM | POA: Diagnosis not present

## 2021-07-20 DIAGNOSIS — F411 Generalized anxiety disorder: Secondary | ICD-10-CM | POA: Diagnosis not present

## 2021-07-20 DIAGNOSIS — G629 Polyneuropathy, unspecified: Secondary | ICD-10-CM | POA: Diagnosis not present

## 2021-07-20 DIAGNOSIS — Z0001 Encounter for general adult medical examination with abnormal findings: Secondary | ICD-10-CM | POA: Diagnosis not present

## 2021-07-20 DIAGNOSIS — Z6825 Body mass index (BMI) 25.0-25.9, adult: Secondary | ICD-10-CM | POA: Diagnosis not present

## 2021-09-16 DIAGNOSIS — G629 Polyneuropathy, unspecified: Secondary | ICD-10-CM | POA: Diagnosis not present

## 2021-09-16 DIAGNOSIS — I10 Essential (primary) hypertension: Secondary | ICD-10-CM | POA: Diagnosis not present

## 2021-09-16 DIAGNOSIS — K580 Irritable bowel syndrome with diarrhea: Secondary | ICD-10-CM | POA: Diagnosis not present

## 2022-02-01 DIAGNOSIS — G629 Polyneuropathy, unspecified: Secondary | ICD-10-CM | POA: Diagnosis not present

## 2022-02-01 DIAGNOSIS — F5101 Primary insomnia: Secondary | ICD-10-CM | POA: Diagnosis not present

## 2022-02-01 DIAGNOSIS — I10 Essential (primary) hypertension: Secondary | ICD-10-CM | POA: Diagnosis not present

## 2022-02-01 DIAGNOSIS — F411 Generalized anxiety disorder: Secondary | ICD-10-CM | POA: Diagnosis not present

## 2022-02-10 DIAGNOSIS — Z6823 Body mass index (BMI) 23.0-23.9, adult: Secondary | ICD-10-CM | POA: Diagnosis not present

## 2022-02-10 DIAGNOSIS — U071 COVID-19: Secondary | ICD-10-CM | POA: Diagnosis not present

## 2022-02-10 DIAGNOSIS — I10 Essential (primary) hypertension: Secondary | ICD-10-CM | POA: Diagnosis not present

## 2022-03-01 NOTE — Progress Notes (Signed)
03/03/2022 Stephanie Armstrong [id number] Dec 02, 1955  Referring provider: Cory Munch, PA-C Primary GI doctor: Dr. Henrene Pastor  ASSESSMENT AND PLAN:   Irritable bowel syndrome with both constipation and diarrhea Negative celiac panel, negative pancreatic elastase Get lipase Can do trial of IBGARD daily, will give Bentyl as needed in the mean time  Add on citracel/benefiber FODMAP, trial off lactulose and lifestyle changes discussed Consider SIBO testing or xifaxin trial pending results - linzess samples given 72 and 145, will call with which one helps.  - instructions how to take given to patient in AVS - lifestyle changes discussed   Pelvic floor dysfunction in female S/p repair in 2017 Has incomplete Bm's, some fecal leakage/incontinence Colon normal 2022 Long discussion with patient, wants to pursue anal rectal manometry Will also proceed with pelvic floor PT  Diverticulosis Will call if any symptoms. Add on fiber supplement, avoid NSAIDS, information given   Patient Care Team: [name], MD as PCP - General (Internal Medicine)  HISTORY OF PRESENT ILLNESS: 66 y.o. female with a past medical history of hypertension, anxiety, and others listed below presents for evaluation of changes in bowel habits.   08/2012 colonoscopy unremarkable for screening 03/2020 seen in the office by Dr. Henrene Pastor for diarrhea, negative celiac, negative GI pathogen, normal pancreatic elastase 04/30/2020 colonoscopy for diarrhea excellent prep with Suprep diverticulosis unremarkable recall 10 years.  Pathology showed no evidence of microscopic colitis  She was drinking a lot of green tea at home during time of evaluation and stopped that which helped.  She states in last 3 months, she had 1 month of well formed regular stool but in the past 2 months has had more constipation. She has had to take a laxative or enema.  She feels she has no urge to have Bm until there is pressure on her  rectum.  She also has had several smells in last 6 months with urgency and loose stools, could have small seepage into her underwear.  She has feeling of incomplete Bm's no matter if hard stool or liquid stools.  She states she has had to drive to [facility] to visit son in last years due to feeling uncomfortable on planes so she could stop when she needed.  Would have to drive in a depends underwear, never used it but felt she had to use it.  She has some bloating with constipation but no AB pain.  Has some increase gas.  She had bladder sling repair in 2017, she has 3 kids, has some urinary hesitancy.   She  reports that she has never smoked. She has never used smokeless tobacco. She reports current alcohol use of about 8.0 standard drinks of alcohol per week. She reports that she does not use drugs.  Current Medications:    Current Outpatient Medications (Cardiovascular):    amLODipine-benazepril (LOTREL) 5-40 MG capsule, TAKE (1) CAPSULE BY MOUTH ONCE DAILY.   hydrochlorothiazide (HYDRODIURIL) 25 MG tablet, TAKE ONE TABLET BY MOUTH ONCE DAILY AS NEEDED. DO NOT EXCEED 3 PER WEEK. APPT REQUIRED FOR FUTURE REFILLS   Current Outpatient Medications (Analgesics):    ibuprofen (ADVIL) 800 MG tablet, Take 1 tablet (800 mg total) by mouth every 8 (eight) hours as needed.   Current Outpatient Medications (Other):    ALPRAZolam (XANAX) 0.5 MG tablet, Take 1 tab p.o. qhs for 2 weeks, then take 1/2 tab p.o. qhs, then take 1/2 tab qod, then discontinue.   Ascorbic Acid (VITAMIN C) 100 MG tablet, Take  100 mg by mouth daily.   diphenoxylate-atropine (LOMOTIL) 2.5-0.025 MG tablet, TAKE 2 TABLETS BY MOUTH 4 TIMES DAILY AS NEEDED FOR DIARRHEA OR LOOSE STOOLS. MAX 8 TABLETS IN 24 HOURS.   estradiol (ESTRACE VAGINAL) 0.1 MG/GM vaginal cream, Place 1/2 gram vaginally at hs two to three times per week as needed.   fluorouracil (EFUDEX) 5 % cream, Apply topically daily as needed.   lactobacillus acidophilus (BACID)  TABS tablet, Take 2 tablets by mouth 3 (three) times daily.   tretinoin (RETIN-A) 0.05 % cream,    zolpidem (AMBIEN) 10 MG tablet, Take 10 mg by mouth daily.  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 This is really VIN II and not VAIN II.    Wears contact lenses    Allergies:  Allergies  Allergen Reactions   Amoxicillin-Pot Clavulanate Diarrhea   Codeine Nausea Only and Other (See Comments)     Surgical History:  She  has a past surgical history that includes Tubal ligation; Vaginal hysterectomy (1999); Cystoscopy with biopsy (Bilateral, 11/03/2015); Mass excision (N/A, 11/03/2015); Colonoscopy; Abdominal sacrocolpopexy (N/A, 03/28/2016); Anterior and posterior repair (N/A, 03/28/2016); Bladder suspension (N/A, 03/28/2016); Cystoscopy (N/A, 03/28/2016); Bilateral salpingectomy (Bilateral, 03/28/2016); Lysis of adhesion (N/A, 03/28/2016); and Augmentation mammaplasty (APRIL 2013). Family History:  Her family history includes Breast cancer in her paternal grandmother; Cancer in her father and sister; Diabetes in her father; Heart disease in her father; Heart failure in her mother; Hypertension in her father and mother; Osteoporosis in her mother; Paget's disease of bone in her mother.  REVIEW OF SYSTEMS  : All other systems reviewed and negative except where noted in the History of Present Illness.  PHYSICAL EXAM: BP (!) 130/90 (BP Location: Left Arm, Patient Position: Sitting, Cuff Size: Normal)   Pulse 83   Ht 5'  6" (1.676 m)   Wt 147 lb (66.7 kg)   BMI 23.73 kg/m  General:   Pleasant, well developed female in no acute distress Head:   Normocephalic and atraumatic. Eyes:  sclerae anicteric,conjunctive pink  Heart:   regular rate and rhythm Pulm:  Clear anteriorly; no wheezing Abdomen:   Soft, Flat AB, Active bowel sounds. No tenderness . , No organomegaly appreciated. Rectal: Not evaluated Extremities:  Without edema. Msk: Symmetrical without gross deformities. Peripheral pulses intact.  Neurologic:  Alert and  oriented x4;  No focal deficits.  Skin:   Dry and intact without significant lesions or rashes. Psychiatric:  Cooperative. Normal mood and affect.    Vladimir Crofts, PA-C 9:21 AM

## 2022-03-03 ENCOUNTER — Ambulatory Visit (INDEPENDENT_AMBULATORY_CARE_PROVIDER_SITE_OTHER): Payer: PPO | Admitting: Physician Assistant

## 2022-03-03 ENCOUNTER — Encounter: Payer: Self-pay | Admitting: Physician Assistant

## 2022-03-03 VITALS — BP 130/90 | HR 83 | Ht 66.0 in | Wt 147.0 lb

## 2022-03-03 DIAGNOSIS — K5731 Diverticulosis of large intestine without perforation or abscess with bleeding: Secondary | ICD-10-CM | POA: Diagnosis not present

## 2022-03-03 DIAGNOSIS — K582 Mixed irritable bowel syndrome: Secondary | ICD-10-CM

## 2022-03-03 DIAGNOSIS — M6289 Other specified disorders of muscle: Secondary | ICD-10-CM

## 2022-03-03 DIAGNOSIS — Z1211 Encounter for screening for malignant neoplasm of colon: Secondary | ICD-10-CM

## 2022-03-03 MED ORDER — DICYCLOMINE HCL 20 MG PO TABS: 20.0000 mg | ORAL_TABLET | Freq: Three times a day (TID) | ORAL | 0 refills | Status: DC | PRN

## 2022-03-03 NOTE — Progress Notes (Signed)
Noted  

## 2022-03-03 NOTE — Patient Instructions (Addendum)
You have been scheduled to have an anorectal manometry at [facility] on 08/09/2022 at 10:30am. Please arrive 30 minutes prior to your appointment time for registration (1st floor of the hospital-admissions).  Please make certain to use 1 Fleets enema 2 hours prior to coming for your appointment. You can purchase Fleets enemas from the laxative section at your drug store. You should not eat anything during the two hours prior to the procedure. You may take regular medications with small sips of water at least 2 hours prior to the study.  Anorectal manometry is a test performed to evaluate patients with constipation or fecal incontinence. This test measures the pressures of the anal sphincter muscles, the sensation in the rectum, and the neural reflexes that are needed for normal bowel movements.  THE PROCEDURE The test takes approximately 30 minutes to 1 hour. You will be asked to change into a hospital gown. A technician or nurse will explain the procedure to you, take a brief health history, and answer any questions you may have. The patient then lies on his or her left side. A small, flexible tube, about the size of a thermometer, with a balloon at the end is inserted into the rectum. The catheter is connected to a machine that measures the pressure. During the test, the small balloon attached to the catheter may be inflated in the rectum to assess the normal reflex pathways. The nurse or technician may also ask the person to squeeze, relax, and push at various times. The anal sphincter muscle pressures are measured during each of these maneuvers. To squeeze, the patient tightens the sphincter muscles as if trying to prevent anything from coming out. To push or bear down, the patient strains down as if trying to have a bowel movement.  First do a trial off milk/lactose products if you use them.  Add fiber like benefiber or citracel once a day Increase activity Can do trial of IBGard which  is over the counter for AB pain- Take 1-2 capsules once a day for maintence or twice a day for IBS issues Can give dicyclomine as needed for acute pain. Please try to decrease stress. consider talking with PCP about anti anxiety medication or try head space app for meditation. if any worsening symptoms like blood in stool, weight loss, please call the office   Linzess 72 mcg can increase to 145 mcg and call if this helping and will send in medication *IBS-C patients may begin to experience relief from belly pain and overall abdominal symptoms (pain, discomfort, and bloating) in about 1 week,  with symptoms typically improving over 12 weeks.  Take at least 30 minutes before the first meal of the day on an empty stomach You can have a loose stool if you eat a high-fat breakfast. Give it at least 7 days, may have more bowel movements during that time.   The diarrhea should go away and you should start having normal, complete, full bowel movements.  It may be helpful to start treatment when you can be near the comfort of your own bathroom, such as a weekend.  After you are out we can send in a prescription if you did well, there is a prescription card  We may want to evaluate you for small intestinal bacterial overgrowth, this can cause increase gas, bloating, loose stools or constipation.  There is a test for this we can do or sometimes we will treat a patient with an antibiotic to see if it  helps.    Please try low FODMAP diet- see below- start with eliminating just one column at a time, the table at the very bottom contains foods that are safe to take   FODMAP stands for fermentable oligo-, di-, mono-saccharides and polyols (1). These are the scientific terms used to classify groups of carbs that are notorious for triggering digestive symptoms like bloating, gas and stomach pain.     Pelvic Floor Dysfunction, Female Pelvic floor dysfunction (PFD) is a condition that results when the group  of muscles and connective tissues that support the organs in the pelvis (pelvic floor muscles) do not work well. These muscles and their connections form a sling that supports the colon and bladder. In women, they also support the uterus. PFD causes pelvic floor muscles to be too weak, too tight, or both. In PFD, muscle movements are not coordinated. This may cause bowel or bladder problems. It may also cause pain. What are the causes? This condition may be caused by an injury to the pelvic area or by a weakening of pelvic muscles. This often results from pregnancy and childbirth or other types of strain. In many cases, the exact cause is not known. What increases the risk? The following factors may make you more likely to develop this condition: Having chronic bladder tissue inflammation (interstitial cystitis). Being an older person. Being overweight. History of radiation treatment for cancer in the pelvic region. Previous pelvic surgery, such as removal of the uterus (hysterectomy). What are the signs or symptoms? Symptoms of this condition vary and may include: Bladder symptoms, such as: Trouble starting urination and emptying the bladder. Frequent urinary tract infections. Leaking urine when coughing, laughing, or exercising (stress incontinence). Having to pass urine urgently or frequently. Pain when passing urine. Bowel symptoms, such as: Constipation. Urgent or frequent bowel movements. Incomplete bowel movements. Painful bowel movements. Leaking stool or gas. Unexplained genital or rectal pain. Genital or rectal muscle spasms. Low back pain. Other symptoms may include: A heavy, full, or aching feeling in the vagina. A bulge that protrudes into the vagina. Pain during or after sex. How is this diagnosed? This condition may be diagnosed based on: Your symptoms and medical history. A physical exam. During the exam, your health care provider may check your pelvic muscles for  tightness, spasm, pain, or weakness. This may include a rectal exam and a pelvic exam. In some cases, you may have diagnostic tests, such as: Electrical muscle function tests. Urine flow testing. X-ray tests of bowel function. Ultrasound of the pelvic organs. How is this treated? Treatment for this condition depends on the symptoms. Treatment options include: Physical therapy. This may include Kegel exercises to help relax or strengthen the pelvic floor muscles. Biofeedback. This type of therapy provides feedback on how tight your pelvic floor muscles are so that you can learn to control them. Internal or external massage therapy. A treatment that involves electrical stimulation of the pelvic floor muscles to help control pain (transcutaneous electrical nerve stimulation, or TENS). Sound wave therapy (ultrasound) to reduce muscle spasms. Medicines, such as: Muscle relaxants. Bladder control medicines. Surgery to reconstruct or support pelvic floor muscles may be an option if other treatments do not help. Follow these instructions at home: Activity Do your usual activities as told by your health care provider. Ask your health care provider if you should modify any activities. Do pelvic floor strengthening or relaxing exercises at home as told by your physical therapist. Lifestyle Maintain a healthy weight. Eat  foods that are high in fiber, such as beans, whole grains, and fresh fruits and vegetables. Limit foods that are high in fat and processed sugars, such as fried or sweet foods. Manage stress with relaxation techniques such as yoga or meditation. General instructions If you have problems with leakage: Use absorbable pads or wear padded underwear. Wash frequently with mild soap. Keep your genital and anal area as clean and dry as possible. Ask your health care provider if you should try a barrier cream to prevent skin irritation. Take warm baths to relieve pelvic muscle tension or  spasms. Take over-the-counter and prescription medicines only as told by your health care provider. Keep all follow-up visits. How is this prevented? The cause of PFD is not always known, but there are a few things you can do to reduce the risk of developing this condition, including: Staying at a healthy weight. Getting regular exercise. Managing stress. Contact a health care provider if: Your symptoms are not improving with home care. You have signs or symptoms of PFD that get worse at home. You develop new signs or symptoms. You have signs of a urinary tract infection, such as: Fever. Chills. Increased urinary frequency. A burning feeling when urinating. You have not had a bowel movement in 3 days (constipation). Summary Pelvic floor dysfunction results when the muscles and connective tissues in your pelvic floor do not work well. These muscles and their connections form a sling that supports your colon and bladder. In women, they also support the uterus. PFD may be caused by an injury to the pelvic area or by a weakening of pelvic muscles. PFD causes pelvic floor muscles to be too weak, too tight, or a combination of both. Symptoms may vary from person to person. In most cases, PFD can be treated with physical therapies and medicines. Surgery may be an option if other treatments do not help. This information is not intended to replace advice given to you by your health care provider. Make sure you discuss any questions you have with your health care provider. Document Revised: 08/11/2020 Document Reviewed: 08/11/2020 Elsevier Patient Education  2022 [place].   Diverticulosis Diverticulosis is a condition that develops when small pouches (diverticula) form in the wall of the large intestine (colon). The colon is where water is absorbed and stool (feces) is formed. The pouches form when the inside layer of the colon pushes through weak spots in the outer layers of the colon. You  may have a few pouches or many of them. The pouches usually do not cause problems unless they become inflamed or infected. When this happens, the condition is called diverticulitis- this is left lower quadrant pain, diarrhea, fever, chills, nausea or vomiting.  If this occurs please call the office or go to the hospital. Sometimes these patches without inflammation can also have painless bleeding associated with them, if this happens please call the office or go to the hospital. Preventing constipation and increasing fiber can help reduce diverticula and prevent complications. Even if you feel you have a high-fiber diet, suggest getting on Benefiber or Cirtracel 2 times daily.

## 2022-03-03 NOTE — Addendum Note (Signed)
Addended by: Audrea Muscat on: 03/03/2022 11:39 AM   Modules accepted: Orders

## 2022-03-16 ENCOUNTER — Telehealth: Payer: Self-pay | Admitting: Physician Assistant

## 2022-03-16 MED ORDER — LINACLOTIDE 145 MCG PO CAPS: 145.0000 ug | ORAL_CAPSULE | Freq: Every day | ORAL | 6 refills | Status: DC

## 2022-03-16 NOTE — Telephone Encounter (Signed)
Inbound call from patient requesting linzess be sent to her pharmacy. Please advise.

## 2022-03-16 NOTE — Telephone Encounter (Signed)
Spoke with patient and clarified the strength of the Linzess samples that were given at her appointment.  Sent rx for 131mg

## 2022-03-29 NOTE — Therapy (Signed)
OUTPATIENT PHYSICAL THERAPY FEMALE PELVIC EVALUATION   Patient Name: Stephanie Armstrong MRN: [id number] DOB:06/17/1955, 66 y.o., female Today's Date: 03/30/2022  END OF SESSION:  PT End of Session - 03/30/22 1652     Visit Number 1    Date for PT Re-Evaluation 06/22/22    Authorization Type [employer]    Authorization - Visit Number 1    Authorization - Number of Visits 10    PT Start Time 1600    PT Stop Time 1650    PT Time Calculation (min) 50 min    Activity Tolerance Patient tolerated treatment well    Behavior During Therapy [facility] for tasks assessed/performed             Past Medical History:  Diagnosis Date   Allergy    Anxiety    Arthritis    fingers   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 This is really VIN II and not VAIN II.    Wears contact lenses    Past Surgical History:  Procedure Laterality Date   ABDOMINAL SACROCOLPOPEXY N/A 03/28/2016   Procedure: ABDOMINO SACROCOLPOPEXY;  Surgeon: Patton Salles, MD;  Location: [facility];  Service: Gynecology;  Laterality: N/A;   ANTERIOR AND POSTERIOR REPAIR N/A 03/28/2016   Procedure: ANTERIOR (CYSTOCELE) AND POSTERIOR REPAIR (RECTOCELE);  Surgeon: Patton Salles, MD;  Location: [facility];  Service: Gynecology;  Laterality: N/A;   AUGMENTATION MAMMAPLASTY  APRIL 2013   BREAST IMPLANT CHANGED   BILATERAL SALPINGECTOMY Bilateral 03/28/2016   Procedure: BILATERAL SALPINGECTOMY;  Surgeon: Patton Salles, MD;  Location: [facility];  Service: Gynecology;  Laterality: Bilateral;   BLADDER SUSPENSION N/A 03/28/2016    Procedure: TRANSVAGINAL TAPE (TVT) PROCEDURE exact midurethral sling;  Surgeon: Patton Salles, MD;  Location: [facility];  Service: Gynecology;  Laterality: N/A;   COLONOSCOPY     CYSTOSCOPY N/A 03/28/2016   Procedure: CYSTOSCOPY;  Surgeon: Patton Salles, MD;  Location: [facility];  Service: Gynecology;  Laterality: N/A;   CYSTOSCOPY WITH BIOPSY Bilateral 11/03/2015   Procedure: CYSTOSCOPY WITH URETHRAL BIOPSY, FULGERATION, BILATERAL RETROGRADE PYELOGRAMS;  Surgeon: Sebastian Ache, MD;  Location[facility];  Service: Urology;  Laterality: Bilateral;   LYSIS OF ADHESION N/A 03/28/2016   Procedure: LYSIS OF ADHESION;  Surgeon: Patton Salles, MD;  Location: [facility];  Service: Gynecology;  Laterality: N/A;   MASS EXCISION N/A 11/03/2015   Procedure: EXCISION  URETHRAL MASS;  Surgeon: Sebastian Ache, MD;  Location: [facility];  Service: Urology;  Laterality: N/A;   TUBAL LIGATION     VAGINAL HYSTERECTOMY  1999   Patient Active Problem List   Diagnosis Date Noted   Generalized anxiety disorder 09/06/2016   Status post surgery 03/28/2016   Carpal tunnel syndrome 07/02/2014   Essential hypertension, benign 01/12/2013   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   Easy bruisability 06/26/2012   Menopause 06/26/2012  Insomnia 06/26/2012    PCP: Lonna Cobb, MD  REFERRING PROVIDER: Doree Albee, PA-C   REFERRING DIAG:  [id number] (ICD-10-CM) - Irritable bowel syndrome with both constipation and diarrhea  M62.89 (ICD-10-CM) - Pelvic floor dysfunction in female    THERAPY DIAG:  Muscle weakness (generalized)  Other lack of coordination  Rationale for Evaluation and Treatment: Rehabilitation  ONSET DATE: 10/2021  SUBJECTIVE:                                                                                                                                                                                           SUBJECTIVE  STATEMENT: Progressive of when not having the need to go to the bathroom. The last 6 weeks needs to take a laxative to go to the bathroom. Prior to the last few months she would have the feeling to go to the bathroom when it was in the opening. Patient uses the squatty potty. I feel like the mid section is dead.  Fluid intake: Yes: water, coffee, tea, orange juice, ginger ale,   ; 4-5 cups of coffee; water 24 ounces  PAIN:  Are you having pain? No  PRECAUTIONS: None  WEIGHT BEARING RESTRICTIONS: No  FALLS:  Has patient fallen in last 6 months? No  LIVING ENVIRONMENT: Lives with: lives with their family  OCCUPATION: Print production planner  PLOF: Independent  PATIENT GOALS: improve sensation of going to the bathroom  PERTINENT HISTORY:  Abdominal Sacrocolpopexy and anterior and posterior repair 03/28/16; vaginal hysterectomy 1999; osteopenia; IBS; Basel Cell skin   BOWEL MOVEMENT: Pain with bowel movement: No Type of bowel movement:Type (Bristol Stool Scale) type 4, Frequency every 4 days when takes a laxative, Strain No, and Splinting yes Fully empty rectum: No, not a formed stool Leakage: Yes: on occasion ; cough pass gas Pads: Yes: 1 pad per month, wear one to an event for comfort Fiber supplement: Yes: Citrucel  URINATION: Pain with urination: No Fully empty bladder: Yes:   Stream: Weak Urgency: No Frequency: every couple of hours Leakage:  none  INTERCOURSE:Not sexually active   PREGNANCY: Vaginal deliveries 3 Tearing Yes: with first child    OBJECTIVE:    COGNITION: Overall cognitive status: Within functional limits for tasks assessed       PELVIC ALIGNMENT:  LUMBARAROM/PROM:  A/PROM A/PROM  eval  Extension Decreased by 25%  Right rotation Decreased by 25%   (Blank rows = not tested)  LOWER EXTREMITY ROM: Bilateral hip ROM is full  LOWER EXTREMITY MMT:  MMT Right eval Left eval  Hip abduction 4/5 4/5  Hip adduction 5/5 5/5   PALPATION:  General  contracts the abdomen with lower abdominal bulging.                 External Perineal Exam able to contract and bulge minimally                             Internal Pelvic Floor tightness in the sacrotuberous ligament; decreased movement of the puborectalis  Patient confirms identification and approves PT to assess internal pelvic floor and treatment Yes  PELVIC MMT:   MMT eval  Internal Anal Sphincter 2/5  External Anal Sphincter 2/5  Puborectalis Initially was a 0/5 then with manual work was 2/5  (Blank rows = not tested)          TODAY'S TREATMENT:                                                                                                                              DATE: 03/30/2022  EVAL See below   PATIENT EDUCATION:  03/30/2022 Education details: Access Code: [email or web address] Person educated: Patient Education method: Programmer, multimedia, Facilities manager, Actor cues, Verbal cues, and Handouts Education comprehension: verbalized understanding, returned demonstration, verbal cues required, tactile cues required, and needs further education  HOME EXERCISE PROGRAM: 03/30/2022 Access Code: [email or web address] URL: [email or web address] Date: 03/30/2022 Prepared by: Eulis Foster  Exercises - Supine Pelvic Floor Contraction  - 3 x daily - 7 x weekly - 1 sets - 10 reps - 10 sec hold  ASSESSMENT:  CLINICAL IMPRESSION: Patient is a 66 y.o. female who was seen today for physical therapy evaluation and treatment for IBS and pelvic floor dysfunction. Patient reports in the past several months she will only have a bowel movement when she takes a laxative every 4 days. She will leak gas when she  coughs. Pelvic floor strength is 2/5 for the IAS and Eas but after manual work she was able to lift the muscle. The puborectalis was 0/5 strength and after manual work went to 2/5. Patient was able to have the sensitivity to contract the rectum correctly and not contract the gluteal.  Patient had difficulty with pushing the stool out of the rectum. Patient will benefit from skilled therapy to improve pelvic floor strength and coordination to assist her with having regular bowel movements.   OBJECTIVE IMPAIRMENTS: decreased coordination, decreased endurance, and decreased strength.   ACTIVITY LIMITATIONS: continence and toileting  PARTICIPATION LIMITATIONS: community activity  PERSONAL FACTORS: Age, Fitness, and Time since onset of injury/illness/exacerbation are also affecting patient's functional outcome.   REHAB POTENTIAL: Excellent  CLINICAL DECISION MAKING: Stable/uncomplicated  EVALUATION COMPLEXITY: Low   GOALS: Goals reviewed with patient? Yes  SHORT TERM GOALS: Target date: 04/26/2022  Patient able to contract the pelvic floor with a lift to improve sensitivity of the anus.  Baseline: Goal status: INITIAL  2.  Patient instructed in abdominal massage to promote peristalic motion of the intestines Baseline:  Goal status: INITIAL  3.  Patient is able to contract the lower abdomen without bulging it.  Baseline:  Goal status: INITIAL   LONG TERM GOALS: Target date: 06/22/2022  Patient independent with advanced HEP for core and pelvic floor strength to improve her toileting Baseline:  Goal status: INITIAL  2.  Patient reports she feels the urge to have a bowel movement prior to the stool being at the anus due to increased sensitivity.  Baseline:  Goal status: INITIAL  3.  Patient is able to have a bowel movement every 2-3 days with or without assistance from medication due to improve coordination of the rectum.  Baseline:  Goal status: INITIAL  4.  Patient is able to push the therapist finger out of the rectum due to the improvement of increasing pressure of the muscles.  Baseline:  Goal status: INITIAL  5.  Anal strength >/= 4/5 holding for 40 seconds to improve leakage of gas when she coughs.  Baseline:  Goal status: INITIAL   PLAN:  PT  FREQUENCY: 1x/week  PT DURATION: 12 weeks  PLANNED INTERVENTIONS: Therapeutic exercises, Therapeutic activity, Neuromuscular re-education, Patient/Family education, Joint mobilization, Dry Needling, Electrical stimulation, Biofeedback, and Manual therapy  PLAN FOR NEXT SESSION: manual work on abdomen to improve peristalic motion of the intestines and increase upper abdominal mobility; abdominal contraction , diaphragmatic breathing, pelvic floor contraction  Eulis Foster, PT 03/30/22 5:04 PM

## 2022-03-30 ENCOUNTER — Encounter: Payer: PPO | Attending: Physician Assistant | Admitting: Physical Therapy

## 2022-03-30 ENCOUNTER — Other Ambulatory Visit: Payer: Self-pay

## 2022-03-30 ENCOUNTER — Encounter: Payer: Self-pay | Admitting: Physical Therapy

## 2022-03-30 DIAGNOSIS — R278 Other lack of coordination: Secondary | ICD-10-CM | POA: Diagnosis not present

## 2022-03-30 DIAGNOSIS — M6281 Muscle weakness (generalized): Secondary | ICD-10-CM | POA: Insufficient documentation

## 2022-04-18 ENCOUNTER — Encounter: Payer: PPO | Admitting: Physical Therapy

## 2022-04-27 ENCOUNTER — Encounter: Payer: Self-pay | Admitting: Physical Therapy

## 2022-04-27 ENCOUNTER — Encounter: Payer: PPO | Attending: Physician Assistant | Admitting: Physical Therapy

## 2022-04-27 ENCOUNTER — Telehealth: Payer: Self-pay | Admitting: Physician Assistant

## 2022-04-27 DIAGNOSIS — R278 Other lack of coordination: Secondary | ICD-10-CM

## 2022-04-27 DIAGNOSIS — M6281 Muscle weakness (generalized): Secondary | ICD-10-CM

## 2022-04-27 NOTE — Telephone Encounter (Signed)
Patient is requesting refill for Linzess, however wants to lessen the dose to '75mg'$ , states the '145mg'$  is too strong, and is having diarrhea. Please advise.

## 2022-04-27 NOTE — Therapy (Signed)
OUTPATIENT PHYSICAL THERAPY TREATMENT NOTE   Patient Name: Stephanie Armstrong MRN: [id number] DOB:[birth year], 67 y.o., female Today's Date: 04/27/2022  PCP: Nicholes Mango, MD  REFERRING PROVIDER: Vladimir Crofts, PA-C    END OF SESSION:   PT End of Session - 04/27/22 1602     Visit Number 2    Date for PT Re-Evaluation 06/22/22    Authorization Type [employer]    Authorization - Visit Number 2    Authorization - Number of Visits 10    PT Start Time 1600    PT Stop Time 1645    PT Time Calculation (min) 45 min    Activity Tolerance Patient tolerated treatment well    Behavior During Therapy [facility] for tasks assessed/performed             Past Medical History:  Diagnosis Date   Allergy    Anxiety    Arthritis    fingers   ASCUS with positive high risk human papillomavirus of vagina dx 10/11/15   Cancer (HCC)    basal cell skin   Hepatitis    age 74 , unknown type   History of vulvar dysplasia    VIN 2   Hypertension    IBS (irritable bowel syndrome)    Osteopenia 2018   Rectocele    Urethral pain    mass   Urinary incontinence    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 This is really VIN II and not VAIN II.    Wears contact lenses    Past Surgical History:  Procedure Laterality Date   ABDOMINAL SACROCOLPOPEXY N/A 03/28/2016   Procedure: ABDOMINO SACROCOLPOPEXY;  Surgeon: Nunzio Cobbs, MD;  Location: [facility];  Service: Gynecology;  Laterality: N/A;   ANTERIOR AND POSTERIOR REPAIR N/A 03/28/2016   Procedure: ANTERIOR (CYSTOCELE) AND POSTERIOR REPAIR (RECTOCELE);  Surgeon: Nunzio Cobbs, MD;  Location: [facility];  Service: Gynecology;  Laterality: N/A;   AUGMENTATION MAMMAPLASTY  APRIL 2013   BREAST IMPLANT CHANGED   BILATERAL SALPINGECTOMY Bilateral 03/28/2016   Procedure: BILATERAL SALPINGECTOMY;  Surgeon: Nunzio Cobbs, MD;  Location: [facility];  Service:  Gynecology;  Laterality: Bilateral;   BLADDER SUSPENSION N/A 03/28/2016   Procedure: TRANSVAGINAL TAPE (TVT) PROCEDURE exact midurethral sling;  Surgeon: Nunzio Cobbs, MD;  Location: [facility];  Service: Gynecology;  Laterality: N/A;   COLONOSCOPY     CYSTOSCOPY N/A 03/28/2016   Procedure: CYSTOSCOPY;  Surgeon: Nunzio Cobbs, MD;  Location: [facility];  Service: Gynecology;  Laterality: N/A;   CYSTOSCOPY WITH BIOPSY Bilateral 11/03/2015   Procedure: CYSTOSCOPY WITH URETHRAL BIOPSY, FULGERATION, BILATERAL RETROGRADE PYELOGRAMS;  Surgeon: Alexis Frock, MD;  Location: [facility];  Service: Urology;  Laterality: Bilateral;   LYSIS OF ADHESION N/A 03/28/2016   Procedure: LYSIS OF ADHESION;  Surgeon: Nunzio Cobbs, MD;  Location: [facility];  Service: Gynecology;  Laterality: N/A;   MASS EXCISION N/A 11/03/2015   Procedure: EXCISION  URETHRAL MASS;  Surgeon: Alexis Frock, MD;  Location: [facility];  Service: Urology;  Laterality: N/A;   TUBAL LIGATION     VAGINAL HYSTERECTOMY  1999   Patient Active Problem List   Diagnosis Date Noted   Generalized anxiety disorder 09/06/2016   Status post surgery 03/28/2016   Carpal tunnel syndrome 07/02/2014   Essential hypertension, benign 01/12/2013   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/08/2012   Easy bruisability 06/26/2012   Menopause 06/26/2012   Insomnia 06/26/2012   REFERRING DIAG:  K58.2 (ICD-10-CM) - Irritable bowel syndrome with both constipation and diarrhea  M62.89 (ICD-10-CM) - Pelvic floor dysfunction in female      THERAPY DIAG:  Muscle weakness (generalized)   Other lack of coordination   Rationale for Evaluation and Treatment: Rehabilitation   ONSET DATE: 10/2021   SUBJECTIVE:                                                                                                                                                                                             SUBJECTIVE STATEMENT: I feel the vaginal when I contract the anus.  Fluid intake: Yes: water, coffee, tea, orange juice, ginger ale,   ; 4-5 cups of coffee; water 24 ounces   PAIN:  Are you having pain? No   PRECAUTIONS: None   WEIGHT BEARING RESTRICTIONS: No   FALLS:  Has patient fallen in last 6 months? No   LIVING ENVIRONMENT: Lives with: lives with their family   OCCUPATION: Glass blower/designer   PLOF: Independent   PATIENT GOALS: improve sensation of going to the bathroom   PERTINENT HISTORY:  Abdominal Sacrocolpopexy and anterior and posterior repair 03/28/16; vaginal hysterectomy 1999; osteopenia; IBS; Basel Cell skin     BOWEL MOVEMENT: Pain with bowel movement: No Type of bowel movement:Type (Bristol Stool Scale) type 4, Frequency every 4 days when takes a laxative, Strain No, and Splinting yes Fully empty rectum: No, not a formed stool Leakage: Yes: on occasion ; cough pass gas Pads: Yes: 1 pad per month, wear one to an event for comfort Fiber supplement: Yes: Citrucel   URINATION: Pain with urination: No Fully empty bladder: Yes:   Stream: Weak Urgency: No Frequency: every couple of hours Leakage:  none   INTERCOURSE:Not sexually active     PREGNANCY: Vaginal deliveries 3 Tearing Yes: with first child       OBJECTIVE:      COGNITION: Overall cognitive status: Within functional limits for tasks assessed                              PELVIC ALIGNMENT:   LUMBARAROM/PROM:   A/PROM A/PROM  eval  Extension Decreased by 25%  Right rotation Decreased by 25%   (Blank rows = not tested)   LOWER EXTREMITY ROM: Bilateral hip ROM is full   LOWER EXTREMITY MMT:   MMT Right eval Left eval  Hip abduction 4/5 4/5  Hip adduction 5/5 5/5  PALPATION:   General  contracts the abdomen with lower abdominal bulging.                  External Perineal Exam able to contract and bulge minimally                             Internal Pelvic Floor  tightness in the sacrotuberous ligament; decreased movement of the puborectalis   Patient confirms identification and approves PT to assess internal pelvic floor and treatment Yes   PELVIC MMT:   MMT eval  Internal Anal Sphincter 2/5  External Anal Sphincter 2/5  Puborectalis Initially was a 0/5 then with manual work was 2/5  (Blank rows = not tested)             TODAY'S TREATMENT:  04/27/22 Manual: Soft tissue mobilization: Circular movement around the abdomen to improve peristalic motion Manual work to the diaphragm Myofascial release: Tissue rolling of the abdominal tissue Release of the mesenteric root Release of the sac of douglas Neuromuscular re-education: Core facilitation: Supine press hands into the thighs to contract the lower abdominals without over contracting the upper abdominals Sitting holding 2# leaning back with lower abdominal engagement Down training: Diaphragmatic breathing to expand the lower rib cage and abdomen in supine and sitting                                                                                                                                 PATIENT EDUCATION:  04/27/2022 Education details: Access Code: [email or web address] Person educated: Patient Education method: Consulting civil engineer, Demonstration, Corporate treasurer cues, Verbal cues, and Handouts Education comprehension: verbalized understanding, returned demonstration, verbal cues required, tactile cues required, and needs further education   HOME EXERCISE PROGRAM: 04/27/2022 Access Code: [email or web address] URL: [email or web address] Date: 04/27/2022 Prepared by: Earlie Counts  Exercises - Supine Pelvic Floor Contraction  - 3 x daily - 7 x weekly - 1 sets - 10 reps - 10 sec hold - Supine Diaphragmatic Breathing  - 1 x daily - 7 x weekly - 1 sets - 10 reps - Seated Diaphragmatic Breathing  - 1 x daily - 7 x weekly - 1 sets - 10 reps - Supine Transversus Abdominis Bracing - Hands on Thighs  - 1 x daily  - 7 x weekly - 1 sets - 10 reps - 5 sec hold - Seated Lean Back on Swiss Ball  - 1 x daily - 7 x weekly - 1 sets - 10 reps    ASSESSMENT:   CLINICAL IMPRESSION: Patient is a 67 y.o. female who was seen today for physical therapy treatment for IBS and pelvic floor dysfunction. Patient has fascial tightness of the upper abdominals. She was able to perform diaphragmatic breathing. Patient was able to contract the lower abdominals correctly today. Patient will benefit from skilled therapy to improve pelvic floor strength and coordination to assist her with having  regular bowel movements.    OBJECTIVE IMPAIRMENTS: decreased coordination, decreased endurance, and decreased strength.    ACTIVITY LIMITATIONS: continence and toileting   PARTICIPATION LIMITATIONS: community activity   PERSONAL FACTORS: Age, Fitness, and Time since onset of injury/illness/exacerbation are also affecting patient's functional outcome.    REHAB POTENTIAL: Excellent   CLINICAL DECISION MAKING: Stable/uncomplicated   EVALUATION COMPLEXITY: Low     GOALS: Goals reviewed with patient? Yes   SHORT TERM GOALS: Target date: 04/26/2022   Patient able to contract the pelvic floor with a lift to improve sensitivity of the anus.  Baseline: Goal status: INITIAL   2.  Patient instructed in abdominal massage to promote peristalic motion of the intestines Baseline:  Goal status: INITIAL   3.  Patient is able to contract the lower abdomen without bulging it.  Baseline:  Goal status: INITIAL     LONG TERM GOALS: Target date: 06/22/2022   Patient independent with advanced HEP for core and pelvic floor strength to improve her toileting Baseline:  Goal status: INITIAL   2.  Patient reports she feels the urge to have a bowel movement prior to the stool being at the anus due to increased sensitivity.  Baseline:  Goal status: INITIAL   3.  Patient is able to have a bowel movement every 2-3 days with or without assistance  from medication due to improve coordination of the rectum.  Baseline:  Goal status: INITIAL   4.  Patient is able to push the therapist finger out of the rectum due to the improvement of increasing pressure of the muscles.  Baseline:  Goal status: INITIAL   5.  Anal strength >/= 4/5 holding for 40 seconds to improve leakage of gas when she coughs.  Baseline:  Goal status: INITIAL     PLAN:   PT FREQUENCY: 1x/week   PT DURATION: 12 weeks   PLANNED INTERVENTIONS: Therapeutic exercises, Therapeutic activity, Neuromuscular re-education, Patient/Family education, Joint mobilization, Dry Needling, Electrical stimulation, Biofeedback, and Manual therapy   PLAN FOR NEXT SESSION: manual work on abdomen to  increase upper abdominal mobility; abdominal contraction ,  pelvic floor contraction, go over home abdominal massage  Earlie Counts, PT 04/27/22 4:58 PM

## 2022-04-28 MED ORDER — LINACLOTIDE 72 MCG PO CAPS: 72.0000 ug | ORAL_CAPSULE | Freq: Every day | ORAL | 6 refills | Status: DC

## 2022-04-28 NOTE — Telephone Encounter (Signed)
Pt is taking the Linzess 145 mcg but thinks it is causing her to have diarrhea. She would like to have the Linzess 72 mcg. Is this ok to send for her?

## 2022-04-28 NOTE — Telephone Encounter (Signed)
The pt has been advised and prescription sent  

## 2022-05-01 ENCOUNTER — Encounter: Payer: Self-pay | Admitting: Physical Therapy

## 2022-05-01 ENCOUNTER — Ambulatory Visit: Payer: PPO | Attending: Physician Assistant | Admitting: Physical Therapy

## 2022-05-01 DIAGNOSIS — M6281 Muscle weakness (generalized): Secondary | ICD-10-CM

## 2022-05-01 DIAGNOSIS — M6289 Other specified disorders of muscle: Secondary | ICD-10-CM | POA: Insufficient documentation

## 2022-05-01 DIAGNOSIS — R278 Other lack of coordination: Secondary | ICD-10-CM

## 2022-05-01 DIAGNOSIS — K582 Mixed irritable bowel syndrome: Secondary | ICD-10-CM | POA: Diagnosis not present

## 2022-05-01 NOTE — Therapy (Signed)
OUTPATIENT PHYSICAL THERAPY TREATMENT NOTE   Patient Name: Stephanie Armstrong MRN: [id number] DOB:18-Sep-1955, 67 y.o., female Today's Date: 05/01/2022  PCP: Nicholes Mango, MD  REFERRING PROVIDER: Vladimir Crofts, PA-C     END OF SESSION:   PT End of Session - 05/01/22 1618     Visit Number 3    Date for PT Re-Evaluation 06/22/22    Authorization Type [employer]    Authorization - Visit Number 3    Authorization - Number of Visits 10    PT Start Time 8413    PT Stop Time 1655    PT Time Calculation (min) 40 min    Activity Tolerance Patient tolerated treatment well    Behavior During Therapy [facility] for tasks assessed/performed             Past Medical History:  Diagnosis Date   Allergy    Anxiety    Arthritis    fingers   ASCUS with positive high risk human papillomavirus of vagina dx 10/11/15   Cancer ([place])    basal cell skin   Hepatitis    age 19 , unknown type   History of vulvar dysplasia    VIN 2   Hypertension    IBS (irritable bowel syndrome)    Osteopenia 2018   Rectocele    Urethral pain    mass   Urinary incontinence    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 This is really VIN II and not VAIN II.    Wears contact lenses    Past Surgical History:  Procedure Laterality Date   ABDOMINAL SACROCOLPOPEXY N/A 03/28/2016   Procedure: ABDOMINO SACROCOLPOPEXY;  Surgeon: Nunzio Cobbs, MD;  Location: [facility];  Service: Gynecology;  Laterality: N/A;   ANTERIOR AND POSTERIOR REPAIR N/A 03/28/2016   Procedure: ANTERIOR (CYSTOCELE) AND POSTERIOR REPAIR (RECTOCELE);  Surgeon: Nunzio Cobbs, MD;  Location: [facility];  Service: Gynecology;  Laterality: N/A;   AUGMENTATION MAMMAPLASTY  APRIL 2013   BREAST IMPLANT CHANGED   BILATERAL SALPINGECTOMY Bilateral 03/28/2016   Procedure: BILATERAL SALPINGECTOMY;  Surgeon: Nunzio Cobbs, MD;  Location: [facility];  Service:  Gynecology;  Laterality: Bilateral;   BLADDER SUSPENSION N/A 03/28/2016   Procedure: TRANSVAGINAL TAPE (TVT) PROCEDURE exact midurethral sling;  Surgeon: Nunzio Cobbs, MD;  Location: [facility];  Service: Gynecology;  Laterality: N/A;   COLONOSCOPY     CYSTOSCOPY N/A 03/28/2016   Procedure: CYSTOSCOPY;  Surgeon: Nunzio Cobbs, MD;  Location: [facility];  Service: Gynecology;  Laterality: N/A;   CYSTOSCOPY WITH BIOPSY Bilateral 11/03/2015   Procedure: CYSTOSCOPY WITH URETHRAL BIOPSY, FULGERATION, BILATERAL RETROGRADE PYELOGRAMS;  Surgeon: Alexis Frock, MD;  Location: [facility];  Service: Urology;  Laterality: Bilateral;   LYSIS OF ADHESION N/A 03/28/2016   Procedure: LYSIS OF ADHESION;  Surgeon: Nunzio Cobbs, MD;  Location: [facility];  Service: Gynecology;  Laterality: N/A;   MASS EXCISION N/A 11/03/2015   Procedure: EXCISION  URETHRAL MASS;  Surgeon: Alexis Frock, MD;  Location: [facility];  Service: Urology;  Laterality: N/A;   TUBAL LIGATION     VAGINAL HYSTERECTOMY  1999   Patient Active Problem List   Diagnosis Date Noted   Generalized anxiety disorder 09/06/2016   Status post surgery 03/28/2016   Carpal tunnel syndrome 07/02/2014   Essential hypertension, benign 01/12/2013  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   Easy bruisability 06/26/2012   Menopause 06/26/2012   Insomnia 06/26/2012   REFERRING DIAG:  K58.2 (ICD-10-CM) - Irritable bowel syndrome with both constipation and diarrhea  M62.89 (ICD-10-CM) - Pelvic floor dysfunction in female      THERAPY DIAG:  Muscle weakness (generalized)   Other lack of coordination   Rationale for Evaluation and Treatment: Rehabilitation   ONSET DATE: 10/2021   SUBJECTIVE:                                                                                                                                                                                             SUBJECTIVE STATEMENT:  Fluid intake: Yes: water, coffee, tea, orange juice, ginger ale,   ; 4-5 cups of coffee; water 24 ounces   PAIN:  Are you having pain? No   PRECAUTIONS: None   WEIGHT BEARING RESTRICTIONS: No   FALLS:  Has patient fallen in last 6 months? No   LIVING ENVIRONMENT: Lives with: lives with their family   OCCUPATION: Glass blower/designer   PLOF: Independent   PATIENT GOALS: improve sensation of going to the bathroom   PERTINENT HISTORY:  Abdominal Sacrocolpopexy and anterior and posterior repair 03/28/16; vaginal hysterectomy 1999; osteopenia; IBS; Basel Cell skin     BOWEL MOVEMENT: Pain with bowel movement: No Type of bowel movement:Type (Bristol Stool Scale) type 4, Frequency every 4 days when takes a laxative, Strain No, and Splinting yes Fully empty rectum: No, not a formed stool Leakage: Yes: on occasion ; cough pass gas Pads: Yes: 1 pad per month, wear one to an event for comfort Fiber supplement: Yes: Citrucel   URINATION: Pain with urination: No Fully empty bladder: Yes:   Stream: Weak Urgency: No Frequency: every couple of hours Leakage:  none   INTERCOURSE:Not sexually active     PREGNANCY: Vaginal deliveries 3 Tearing Yes: with first child       OBJECTIVE:      COGNITION: Overall cognitive status: Within functional limits for tasks assessed                              PELVIC ALIGNMENT:   LUMBARAROM/PROM:   A/PROM A/PROM  eval  Extension Decreased by 25%  Right rotation Decreased by 25%   (Blank rows = not tested)   LOWER EXTREMITY ROM: Bilateral hip ROM is full   LOWER EXTREMITY MMT:   MMT Right eval Left eval  Hip abduction 4/5 4/5  Hip adduction 5/5 5/5    PALPATION:   General  contracts  the abdomen with lower abdominal bulging.                  External Perineal Exam able to contract and bulge minimally                             Internal Pelvic Floor tightness in the sacrotuberous ligament; decreased  movement of the puborectalis   Patient confirms identification and approves PT to assess internal pelvic floor and treatment Yes   PELVIC MMT:   MMT eval  Internal Anal Sphincter 2/5  External Anal Sphincter 2/5  Puborectalis Initially was a 0/5 then with manual work was 2/5  (Blank rows = not tested)             TODAY'S TREATMENT:  05/01/22 Manual: Myofascial release: Using a soft ball to massage the abdomen to improve intestinal periostalis and reduce constipation Tissue rolling of the abdominal tissue Release of the mesenteric root Release of the sac of douglas Exercises: Strengthening: Nustep level 5 for 5 minutes while assessing patient Supine contract the lower abdomen with the upper 10x Supine marching with red band around the knees to engage the lower abdominals more.     04/27/22 Manual: Soft tissue mobilization: Circular movement around the abdomen to improve peristalic motion Manual work to the diaphragm Myofascial release: Tissue rolling of the abdominal tissue Release of the mesenteric root Release of the sac of douglas Neuromuscular re-education: Core facilitation: Supine press hands into the thighs to contract the lower abdominals without over contracting the upper abdominals Sitting holding 2# leaning back with lower abdominal engagement Down training: Diaphragmatic breathing to expand the lower rib cage and abdomen in supine and sitting                                                                                                                                  PATIENT EDUCATION:  05/01/2022 Education details: Access Code: [email or web address] Person educated: Patient Education method: Consulting civil engineer, Demonstration, Tactile cues, Verbal cues, and Handouts Education comprehension: verbalized understanding, returned demonstration, verbal cues required, tactile cues required, and needs further education   HOME EXERCISE PROGRAM: 05/01/2022 Access Code:  [email or web address] URL: [email or web address] Date: 05/01/2022 Prepared by: Earlie Counts  Exercises - Supine Pelvic Floor Contraction  - 3 x daily - 7 x weekly - 1 sets - 10 reps - 10 sec hold - Supine Diaphragmatic Breathing  - 1 x daily - 7 x weekly - 1 sets - 10 reps - Seated Diaphragmatic Breathing  - 1 x daily - 7 x weekly - 1 sets - 10 reps - Supine Transversus Abdominis Bracing - Hands on Thighs  - 1 x daily - 7 x weekly - 1 sets - 10 reps - 5 sec hold - Seated Lean Back on Swiss Ball  - 1 x daily - 3 x weekly - 2 sets - 10 reps - Supine  March with Resistance Band  - 1 x daily - 7 x weekly - 3 sets - 10 reps  Patient Education - Abdominal Massage for Constipation     ASSESSMENT:   CLINICAL IMPRESSION: Patient is a 67 y.o. female who was seen today for physical therapy treatment for IBS and pelvic floor dysfunction. Patient is starting to have the urge to have a bowel movement. She is able to push the stool out more times. She understands how to perform manual mobilization to the  abdomen to improve peristalic motion of the intestines. She is able to feel the lower abdomen contract but is not always able to contract the upper abdomen equally with lower abdomen. Patient will benefit from skilled therapy to improve pelvic floor strength and coordination to assist her with having regular bowel movements.    OBJECTIVE IMPAIRMENTS: decreased coordination, decreased endurance, and decreased strength.    ACTIVITY LIMITATIONS: continence and toileting   PARTICIPATION LIMITATIONS: community activity   PERSONAL FACTORS: Age, Fitness, and Time since onset of injury/illness/exacerbation are also affecting patient's functional outcome.    REHAB POTENTIAL: Excellent   CLINICAL DECISION MAKING: Stable/uncomplicated   EVALUATION COMPLEXITY: Low     GOALS: Goals reviewed with patient? Yes   SHORT TERM GOALS: Target date: 04/26/2022   Patient able to contract the pelvic floor with a  lift to improve sensitivity of the anus.  Baseline: Goal status: INITIAL   2.  Patient instructed in abdominal massage to promote peristalic motion of the intestines Baseline:  Goal status: Met 05/01/22   3.  Patient is able to contract the lower abdomen without bulging it.  Baseline:  Goal status: ongoing 05/01/22     LONG TERM GOALS: Target date: 06/22/2022   Patient independent with advanced HEP for core and pelvic floor strength to improve her toileting Baseline:  Goal status: INITIAL   2.  Patient reports she feels the urge to have a bowel movement prior to the stool being at the anus due to increased sensitivity.  Baseline:  Goal status: ongoing 05/01/22   3.  Patient is able to have a bowel movement every 2-3 days with or without assistance from medication due to improve coordination of the rectum.  Baseline:  Goal status: INITIAL   4.  Patient is able to push the therapist finger out of the rectum due to the improvement of increasing pressure of the muscles.  Baseline:  Goal status: INITIAL   5.  Anal strength >/= 4/5 holding for 40 seconds to improve leakage of gas when she coughs.  Baseline:  Goal status: INITIAL     PLAN:   PT FREQUENCY: 1x/week   PT DURATION: 12 weeks   PLANNED INTERVENTIONS: Therapeutic exercises, Therapeutic activity, Neuromuscular re-education, Patient/Family education, Joint mobilization, Dry Needling, Electrical stimulation, Biofeedback, and Manual therapy   PLAN FOR NEXT SESSION: abdominal contraction ,  pelvic floor contraction with therapist finger in the anus to work on contraction   Earlie Counts, PT 05/01/22 5:02 PM

## 2022-05-02 ENCOUNTER — Encounter: Payer: PPO | Admitting: Physical Therapy

## 2022-05-08 ENCOUNTER — Encounter: Payer: Self-pay | Admitting: Physical Therapy

## 2022-05-08 ENCOUNTER — Ambulatory Visit: Payer: PPO | Admitting: Physical Therapy

## 2022-05-08 DIAGNOSIS — R278 Other lack of coordination: Secondary | ICD-10-CM

## 2022-05-08 DIAGNOSIS — M6289 Other specified disorders of muscle: Secondary | ICD-10-CM | POA: Diagnosis not present

## 2022-05-08 DIAGNOSIS — M6281 Muscle weakness (generalized): Secondary | ICD-10-CM

## 2022-05-08 NOTE — Therapy (Signed)
OUTPATIENT PHYSICAL THERAPY TREATMENT NOTE   Patient Name: Stephanie Armstrong MRN: [id number] DOB:05-14-1955, 67 y.o., female Today's Date: 05/08/2022  PCP: Nicholes Mango, MD  REFERRING PROVIDER: Vladimir Crofts, PA-C   END OF SESSION:   PT End of Session - 05/08/22 1617     Visit Number 4    Date for PT Re-Evaluation 06/22/22    Authorization Type [employer]    Authorization - Visit Number 4    Authorization - Number of Visits 10    PT Start Time 4034    PT Stop Time 1655    PT Time Calculation (min) 40 min    Activity Tolerance Patient tolerated treatment well    Behavior During Therapy [facility] for tasks assessed/performed             Past Medical History:  Diagnosis Date   Allergy    Anxiety    Arthritis    fingers   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 This is really VIN II and not VAIN II.    Wears contact lenses    Past Surgical History:  Procedure Laterality Date   ABDOMINAL SACROCOLPOPEXY N/A 03/28/2016   Procedure: ABDOMINO SACROCOLPOPEXY;  Surgeon: Nunzio Cobbs, MD;  Location: [facility];  Service: Gynecology;  Laterality: N/A;   ANTERIOR AND POSTERIOR REPAIR N/A 03/28/2016   Procedure: ANTERIOR (CYSTOCELE) AND POSTERIOR REPAIR (RECTOCELE);  Surgeon: Nunzio Cobbs, MD;  Location: [facility];  Service: Gynecology;  Laterality: N/A;   AUGMENTATION MAMMAPLASTY  APRIL 2013   BREAST IMPLANT CHANGED   BILATERAL SALPINGECTOMY Bilateral 03/28/2016   Procedure: BILATERAL SALPINGECTOMY;  Surgeon: Nunzio Cobbs, MD;  Location: [facility];  Service: Gynecology;   Laterality: Bilateral;   BLADDER SUSPENSION N/A 03/28/2016   Procedure: TRANSVAGINAL TAPE (TVT) PROCEDURE exact midurethral sling;  Surgeon: Nunzio Cobbs, MD;  Location: [facility];  Service: Gynecology;  Laterality: N/A;   COLONOSCOPY     CYSTOSCOPY N/A 03/28/2016   Procedure: CYSTOSCOPY;  Surgeon: Nunzio Cobbs, MD;  Location: [facility];  Service: Gynecology;  Laterality: N/A;   CYSTOSCOPY WITH BIOPSY Bilateral 11/03/2015   Procedure: CYSTOSCOPY WITH URETHRAL BIOPSY, FULGERATION, BILATERAL RETROGRADE PYELOGRAMS;  Surgeon: Alexis Frock, MD;  Location: [facility];  Service: Urology;  Laterality: Bilateral;   LYSIS OF ADHESION N/A 03/28/2016   Procedure: LYSIS OF ADHESION;  Surgeon: Nunzio Cobbs, MD;  Location: [facility];  Service: Gynecology;  Laterality: N/A;   MASS EXCISION N/A 11/03/2015   Procedure: EXCISION  URETHRAL MASS;  Surgeon: Alexis Frock, MD;  Location: [facility];  Service: Urology;  Laterality: N/A;   TUBAL LIGATION     VAGINAL HYSTERECTOMY  1999   Patient Active Problem List   Diagnosis Date Noted   Generalized anxiety disorder 09/06/2016   Status post surgery 03/28/2016   Carpal tunnel syndrome 07/02/2014   Essential hypertension, benign 01/12/2013   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   Easy bruisability 06/26/2012   Menopause 06/26/2012   Insomnia 06/26/2012   REFERRING DIAG:  K58.2 (ICD-10-CM) - Irritable bowel syndrome with both constipation and diarrhea  M62.89 (ICD-10-CM) - Pelvic floor dysfunction in female      THERAPY DIAG:  Muscle weakness (generalized)   Other lack of coordination   Rationale for Evaluation and Treatment: Rehabilitation   ONSET DATE: 10/2021   SUBJECTIVE:                                                                                                                                                                                            SUBJECTIVE  STATEMENT:  I am doing well. I am now having the urge to go to the bathroom. When I have the urge I will go and not delay it. I have been doing my exercises.  Fluid intake: Yes: water, coffee, tea, orange juice, ginger ale,   ; 4-5 cups of coffee; water 24 ounces   PAIN:  Are you having pain? No   PRECAUTIONS: None   WEIGHT BEARING RESTRICTIONS: No   FALLS:  Has patient fallen in last 6 months? No   LIVING ENVIRONMENT: Lives with: lives with their family   OCCUPATION: Glass blower/designer   PLOF: Independent   PATIENT GOALS: improve sensation of going to the bathroom   PERTINENT HISTORY:  Abdominal Sacrocolpopexy and anterior and posterior repair 03/28/16; vaginal hysterectomy 1999; osteopenia; IBS; Basel Cell skin     BOWEL MOVEMENT: Pain with bowel movement: No Type of bowel movement:Type (Bristol Stool Scale) type 4, Frequency every 4 days when takes a laxative, Strain No, and Splinting yes Fully empty rectum: No, not a formed stool Leakage: Yes: on occasion ; cough pass gas Pads: Yes: 1 pad per month, wear one to an event for comfort Fiber supplement: Yes: Citrucel   URINATION: Pain with urination: No Fully empty bladder: Yes:   Stream: Weak Urgency: No Frequency: every couple of hours Leakage:  none   INTERCOURSE:Not sexually active     PREGNANCY: Vaginal deliveries 3 Tearing Yes: with first child       OBJECTIVE:      COGNITION: Overall cognitive status: Within functional limits for tasks assessed                              PELVIC ALIGNMENT:   LUMBARAROM/PROM:   A/PROM A/PROM  eval  Extension Decreased by 25%  Right rotation Decreased by 25%   (Blank rows = not tested)   LOWER EXTREMITY ROM: Bilateral hip ROM is  full   LOWER EXTREMITY MMT:   MMT Right eval Left eval  Hip abduction 4/5 4/5  Hip adduction 5/5 5/5    PALPATION:   General  contracts the abdomen with lower abdominal bulging.                  External Perineal Exam able  to contract and bulge minimally                             Internal Pelvic Floor tightness in the sacrotuberous ligament; decreased movement of the puborectalis   Patient confirms identification and approves PT to assess internal pelvic floor and treatment Yes   PELVIC MMT:   MMT eval  Internal Anal Sphincter 2/5  External Anal Sphincter 2/5  Puborectalis Initially was a 0/5 then with manual work was 2/5  (Blank rows = not tested)             TODAY'S TREATMENT:  05/08/22 Manual: Soft tissue mobilization: Manual work to the abdomen to release fascial restrictions and release tightness in some structures.  Exercises: Stretches/mobility: Cat cow 10x Childs pose holding 30 sec with breath to expand the pelvic floor Supine happy baby hold 30 sec Supine twist hold 30 sec bil.  Sitting trunk twist hold 30 sec bil.  Strengthening: Nustep level 5 for 5 minutes while assessing patient    05/01/22 Manual: Myofascial release: Using a soft ball to massage the abdomen to improve intestinal periostalis and reduce constipation Tissue rolling of the abdominal tissue Release of the mesenteric root Release of the sac of douglas Exercises: Strengthening: Nustep level 5 for 5 minutes while assessing patient Supine contract the lower abdomen with the upper 10x Supine marching with red band around the knees to engage the lower abdominals more.     04/27/22 Manual: Soft tissue mobilization: Circular movement around the abdomen to improve peristalic motion Manual work to the diaphragm Myofascial release: Tissue rolling of the abdominal tissue Release of the mesenteric root Release of the sac of douglas Neuromuscular re-education: Core facilitation: Supine press hands into the thighs to contract the lower abdominals without over contracting the upper abdominals Sitting holding 2# leaning back with lower abdominal engagement Down training: Diaphragmatic breathing to expand the lower  rib cage and abdomen in supine and sitting                                                                                                                                  PATIENT EDUCATION:  05/01/2022 Education details: Access Code: [email or web address] Person educated: Patient Education method: Consulting civil engineer, Demonstration, Tactile cues, Verbal cues, and Handouts Education comprehension: verbalized understanding, returned demonstration, verbal cues required, tactile cues required, and needs further education   HOME EXERCISE PROGRAM: 05/01/2022 Access Code: [email or web address] URL: [email or web address] Date: 05/01/2022 Prepared by: Earlie Counts   Exercises - Supine Pelvic Floor Contraction  - 3 x  daily - 7 x weekly - 1 sets - 10 reps - 10 sec hold - Supine Diaphragmatic Breathing  - 1 x daily - 7 x weekly - 1 sets - 10 reps - Seated Diaphragmatic Breathing  - 1 x daily - 7 x weekly - 1 sets - 10 reps - Supine Transversus Abdominis Bracing - Hands on Thighs  - 1 x daily - 7 x weekly - 1 sets - 10 reps - 5 sec hold - Seated Lean Back on Swiss Ball  - 1 x daily - 3 x weekly - 2 sets - 10 reps - Supine March with Resistance Band  - 1 x daily - 7 x weekly - 3 sets - 10 reps   Patient Education - Abdominal Massage for Constipation     ASSESSMENT:   CLINICAL IMPRESSION: Patient is a 67 y.o. female who was seen today for physical therapy treatment for IBS and pelvic floor dysfunction. Patient is starting to have the urge to have a bowel movement. Patient is able to contract the lower and upper together without bulging the lower abdomen. Patient stool is not getting stuck at the entrance of the anus.  She had good bowel sounds during the manual work. Patient will benefit from skilled therapy to improve pelvic floor strength and coordination to assist her with having regular bowel movements.    OBJECTIVE IMPAIRMENTS: decreased coordination, decreased endurance, and decreased strength.    ACTIVITY  LIMITATIONS: continence and toileting   PARTICIPATION LIMITATIONS: community activity   PERSONAL FACTORS: Age, Fitness, and Time since onset of injury/illness/exacerbation are also affecting patient's functional outcome.    REHAB POTENTIAL: Excellent   CLINICAL DECISION MAKING: Stable/uncomplicated   EVALUATION COMPLEXITY: Low     GOALS: Goals reviewed with patient? Yes   SHORT TERM GOALS: Target date: 04/26/2022   Patient able to contract the pelvic floor with a lift to improve sensitivity of the anus.  Baseline: Goal status: Met 05/08/22   2.  Patient instructed in abdominal massage to promote peristalic motion of the intestines Baseline:  Goal status: Met 05/01/22   3.  Patient is able to contract the lower abdomen without bulging it.  Baseline:  Goal status: Met 05/08/22     LONG TERM GOALS: Target date: 06/22/2022   Patient independent with advanced HEP for core and pelvic floor strength to improve her toileting Baseline:  Goal status: INITIAL   2.  Patient reports she feels the urge to have a bowel movement prior to the stool being at the anus due to increased sensitivity.  Baseline:  Goal status: ongoing 05/01/22   3.  Patient is able to have a bowel movement every 2-3 days with or without assistance from medication due to improve coordination of the rectum.  Baseline:  Goal status: INITIAL   4.  Patient is able to push the therapist finger out of the rectum due to the improvement of increasing pressure of the muscles.  Baseline:  Goal status: INITIAL   5.  Anal strength >/= 4/5 holding for 40 seconds to improve leakage of gas when she coughs.  Baseline:  Goal status: INITIAL     PLAN:   PT FREQUENCY: 1x/week   PT DURATION: 12 weeks   PLANNED INTERVENTIONS: Therapeutic exercises, Therapeutic activity, Neuromuscular re-education, Patient/Family education, Joint mobilization, Dry Needling, Electrical stimulation, Biofeedback, and Manual therapy   PLAN FOR  NEXT SESSION: progress abdominal exercises, see how long she is able to hold pelvic floor contraction  Earlie Counts,  PT 05/08/22 5:02 PM

## 2022-05-12 ENCOUNTER — Ambulatory Visit: Payer: PPO | Admitting: Obstetrics and Gynecology

## 2022-05-15 ENCOUNTER — Ambulatory Visit: Payer: PPO | Admitting: Obstetrics and Gynecology

## 2022-05-15 ENCOUNTER — Other Ambulatory Visit: Payer: Self-pay | Admitting: Obstetrics and Gynecology

## 2022-05-15 DIAGNOSIS — Z1231 Encounter for screening mammogram for malignant neoplasm of breast: Secondary | ICD-10-CM

## 2022-05-16 ENCOUNTER — Encounter: Payer: PPO | Admitting: Physical Therapy

## 2022-05-30 NOTE — Progress Notes (Unsigned)
67 y.o. G12P3 Widowed Caucasian female here for annual exam.    Patient is followed for prolapse, vaginal atrophy, history of HPV, vulvar dysplasia, and osteopenia.  Using vaginal estrogen cream 2 - 3 times per week at bedtime.  Doing pelvic floor therapy.  She was sent by GI due to severe constipation and bloating.   PCP:   Dr. Gerarda Fraction  No LMP recorded. Patient has had a hysterectomy.           Sexually active: No.  The current method of family planning is status post hysterectomy.    Exercising: Yes   walking and strength training Smoker:  no  Health Maintenance: Pap:  05/05/20 ASCUS: HR HPV neg, 01/30/18 neg: HR HPV neg History of abnormal Pap:  yes,   10-11-15 ASCUS:Pos HR HPV-- Colposcopy of vagina and vulva on 10/11/15--  Bx of right and left vaginal apices showed no atypia or dysplasia.  Biopsies of the right and left labia majora also  MMG:  05/30/21 Breast Density Category C, BI-RADS CATEGORY 1 Neg, scheduled for next week Colonoscopy:  04/30/20 BMD:   03/30/20  Result  osteopenic TDaP:  11/26/19 Gardasil:   no Screening Labs: PCP   reports that she has never smoked. She has never used smokeless tobacco. She reports current alcohol use of about 8.0 standard drinks of alcohol per week. She reports that she does not use drugs.  Past Medical History:  Diagnosis Date   Allergy    Anxiety    Arthritis    fingers   ASCUS with positive high risk human papillomavirus of vagina dx 10/11/15   Cancer (HCC)    basal cell skin   Hepatitis    age 33 , unknown type   History of vulvar dysplasia    VIN 2   Hypertension    IBS (irritable bowel syndrome)    Osteopenia 2018   Rectocele    Urethral pain    mass   Urinary incontinence    VAIN II (vaginal intraepithelial neoplasia grade II) dx 08/2015   right and left labia minora. This is really VIN II and not VAIN II.    Wears contact lenses     Past Surgical History:  Procedure Laterality Date   ABDOMINAL SACROCOLPOPEXY N/A  03/28/2016   Procedure: ABDOMINO SACROCOLPOPEXY;  Surgeon: Nunzio Cobbs, MD;  Location: [facility] [facility];  Service: Gynecology;  Laterality: N/A;   ANTERIOR AND POSTERIOR REPAIR N/A 03/28/2016   Procedure: ANTERIOR (CYSTOCELE) AND POSTERIOR REPAIR (RECTOCELE);  Surgeon: Nunzio Cobbs, MD;  Location: [place] [facility];  Service: Gynecology;  Laterality: N/A;   AUGMENTATION MAMMAPLASTY  APRIL 2013   BREAST IMPLANT CHANGED   BILATERAL SALPINGECTOMY Bilateral 03/28/2016   Procedure: BILATERAL SALPINGECTOMY;  Surgeon: Nunzio Cobbs, MD;  Location: [facility] [facility];  Service: Gynecology;  Laterality: Bilateral;   BLADDER SUSPENSION N/A 03/28/2016   Procedure: TRANSVAGINAL TAPE (TVT) PROCEDURE exact midurethral sling;  Surgeon: Nunzio Cobbs, MD;  Location: [facility];  Service: Gynecology;  Laterality: N/A;   COLONOSCOPY     CYSTOSCOPY N/A 03/28/2016   Procedure: CYSTOSCOPY;  Surgeon: Nunzio Cobbs, MD;  Location: [facility];  Service: Gynecology;  Laterality: N/A;   CYSTOSCOPY WITH BIOPSY Bilateral 11/03/2015   Procedure: CYSTOSCOPY WITH URETHRAL BIOPSY, FULGERATION, BILATERAL RETROGRADE PYELOGRAMS;  Surgeon: Alexis Frock, MD;  Location: [facility];  Service: Urology;  Laterality: Bilateral;   LYSIS OF ADHESION N/A 03/28/2016  Procedure: LYSIS OF ADHESION;  Surgeon: Nunzio Cobbs, MD;  Location: [place] [facility];  Service: Gynecology;  Laterality: N/A;   MASS EXCISION N/A 11/03/2015   Procedure: EXCISION  URETHRAL MASS;  Surgeon: Alexis Frock, MD;  Location: [facility];  Service: Urology;  Laterality: N/A;   TUBAL LIGATION     VAGINAL HYSTERECTOMY  1999    Current Outpatient Medications  Medication Sig Dispense Refill   ALPRAZolam (XANAX) 0.5 MG tablet Take 1 tab p.o. qhs for 2 weeks, then take 1/2 tab p.o. qhs, then take 1/2 tab qod, then discontinue. 30 tablet 0   amLODipine-benazepril (LOTREL) 5-40 MG capsule TAKE (1) CAPSULE BY MOUTH ONCE  DAILY. 30 capsule 0   Ascorbic Acid (VITAMIN C) 100 MG tablet Take 100 mg by mouth daily.     diphenoxylate-atropine (LOMOTIL) 2.5-0.025 MG tablet TAKE 2 TABLETS BY MOUTH 4 TIMES DAILY AS NEEDED FOR DIARRHEA OR LOOSE STOOLS. MAX 8 TABLETS IN 24 HOURS. 30 tablet 0   estradiol (ESTRACE VAGINAL) 0.1 MG/GM vaginal cream Place 1/2 gram vaginally at hs two to three times per week as needed. 42.5 g 1   fluorouracil (EFUDEX) 5 % cream Apply topically daily as needed.     hydrochlorothiazide (HYDRODIURIL) 25 MG tablet TAKE ONE TABLET BY MOUTH ONCE DAILY AS NEEDED. DO NOT EXCEED 3 PER WEEK. APPT REQUIRED FOR FUTURE REFILLS 30 tablet 0   ibuprofen (ADVIL) 800 MG tablet Take 1 tablet (800 mg total) by mouth every 8 (eight) hours as needed. 30 tablet 0   lactobacillus acidophilus (BACID) TABS tablet Take 2 tablets by mouth 3 (three) times daily.     phentermine (ADIPEX-P) 37.5 MG tablet Take 37.5 mg by mouth daily.     Plecanatide (TRULANCE) 3 MG TABS Take 1 tablet (3 mg total) by mouth daily. 30 tablet 3   tretinoin (RETIN-A) 0.05 % cream      zolpidem (AMBIEN) 10 MG tablet Take 10 mg by mouth daily.     dicyclomine (BENTYL) 20 MG tablet Take 1 tablet (20 mg total) by mouth 3 (three) times daily as needed for spasms. (Patient not taking: Reported on 06/13/2022) 50 tablet 0   No current facility-administered medications for this visit.    Family History  Problem Relation Age of Onset   Hypertension Mother    Osteoporosis Mother    Paget's disease of bone Mother    Heart failure Mother    Cancer Father        MELANOMA   Diabetes Father    Hypertension Father    Heart disease Father    Cancer Sister        THYROID   Breast cancer Paternal Grandmother    Colon cancer Neg Hx    Pancreatic cancer Neg Hx    Esophageal cancer Neg Hx     Review of Systems  All other systems reviewed and are negative.   Exam:   BP 128/82 (BP Location: Right Arm, Patient Position: Sitting, Cuff Size: Normal)    Pulse 82   Ht '5\' 6"'$  (1.676 m)   Wt 151 lb (68.5 kg)   SpO2 100%   BMI 24.37 kg/m     General appearance: alert, cooperative and appears stated age Head: normocephalic, without obvious abnormality, atraumatic Neck: no adenopathy, supple, symmetrical, trachea midline and thyroid normal to inspection and palpation Lungs: clear to auscultation bilaterally Breasts: normal appearance, no masses or tenderness, No nipple retraction or dimpling, No nipple discharge or bleeding,  No axillary adenopathy Heart: regular rate and rhythm Abdomen: soft, non-tender; no masses, no organomegaly Extremities: extremities normal, atraumatic, no cyanosis or edema Skin: skin color, texture, turgor normal. No rashes or lesions Lymph nodes: cervical, supraclavicular, and axillary nodes normal. Neurologic: grossly normal  Pelvic: External genitalia:  no lesions              No abnormal inguinal nodes palpated.              Urethra:  normal appearing urethra with no masses, tenderness or lesions              Bartholins and Skenes: normal                 Vagina: normal appearing vagina with normal color and discharge, no lesions.  Good support of the vagina.  Mesh and sling are protected and not exposed.               Cervix: absent              Pap taken: yes Bimanual Exam:  Uterus:  absent              Adnexa: no mass, fullness, tenderness              Rectal exam: yes.  Confirms.              Anus:  normal sphincter tone, no lesions  Chaperone was present for exam:  Emily  Assessment:   Well woman visit with gynecologic exam. GYN exam for high risk Medicare patient.  Status post TVH.  Status post abdominosacrocolpopexy and anterior and posterior repair, TVT/cysto.  Good support. Hx VIN II.  No signs of recurrence. Hx ASCUS and negative HR HPV of vagina. Status post excision of urethral mass.  Condyloma. Vaginal atrophy.  Medication monitoring encounter.  Bilateral breast implants. Abdominal bloating.   Osteopenia.   Plan: Mammogram screening discussed. Self breast awareness reviewed. Pap and and reflex HR HPV collected. Refill of vaginal estrogen cream.  Effect on breast cancer discussed.  Guidelines for Calcium, Vitamin D, regular exercise program including cardiovascular and weight bearing exercise. Return for pelvic US She will schedule [facility].   Follow up annually and prn.   After visit summary provided.   ***  total time was spent for this patient encounter, including preparation, face-to-face counseling with the patient, coordination of care, and documentation of the encounter.

## 2022-06-06 ENCOUNTER — Encounter: Payer: Self-pay | Admitting: Physical Therapy

## 2022-06-06 DIAGNOSIS — M2011 Hallux valgus (acquired), right foot: Secondary | ICD-10-CM | POA: Diagnosis not present

## 2022-06-06 DIAGNOSIS — M79671 Pain in right foot: Secondary | ICD-10-CM | POA: Diagnosis not present

## 2022-06-07 ENCOUNTER — Telehealth: Payer: Self-pay | Admitting: Physician Assistant

## 2022-06-07 ENCOUNTER — Encounter: Payer: Self-pay | Admitting: Physical Therapy

## 2022-06-07 ENCOUNTER — Ambulatory Visit: Payer: PPO | Attending: Physician Assistant | Admitting: Physical Therapy

## 2022-06-07 DIAGNOSIS — R278 Other lack of coordination: Secondary | ICD-10-CM | POA: Diagnosis not present

## 2022-06-07 DIAGNOSIS — M6281 Muscle weakness (generalized): Secondary | ICD-10-CM | POA: Insufficient documentation

## 2022-06-07 NOTE — Telephone Encounter (Signed)
PT is calling about the Linzess. It is giving her stomach pains every time she takes it and she wants to have either a lower dose or to be prescribed something else. Please advise.

## 2022-06-07 NOTE — Therapy (Addendum)
OUTPATIENT PHYSICAL THERAPY TREATMENT NOTE   Patient Name: Stephanie Armstrong MRN: [id number] DOB:Jan 10, 1956, 67 y.o., female Today's Date: 06/07/2022  PCP: Nicholes Mango, MD   REFERRING PROVIDER: Vladimir Crofts, PA-C    END OF SESSION:   PT End of Session - 06/07/22 1154     Visit Number 5    Date for PT Re-Evaluation 06/22/22    Authorization Type [employer]    Authorization - Visit Number 5    Authorization - Number of Visits 10    PT Start Time [id number]    PT Stop Time 1225    PT Time Calculation (min) 40 min    Activity Tolerance Patient tolerated treatment well    Behavior During Therapy [facility] for tasks assessed/performed             Past Medical History:  Diagnosis Date   Allergy    Anxiety    Arthritis    fingers   ASCUS with positive high risk human papillomavirus of vagina dx 10/11/15   Cancer (HCC)    basal cell skin   Hepatitis    age 1 , unknown type   History of vulvar dysplasia    VIN 2   Hypertension    IBS (irritable bowel syndrome)    Osteopenia 2018   Rectocele    Urethral pain    mass   Urinary incontinence    VAIN II (vaginal intraepithelial neoplasia grade II) dx 08/2015   right and left labia minora. This is really VIN II and not VAIN II.    Wears contact lenses    Past Surgical History:  Procedure Laterality Date   ABDOMINAL SACROCOLPOPEXY N/A 03/28/2016   Procedure: ABDOMINO SACROCOLPOPEXY;  Surgeon: Nunzio Cobbs, MD;  Location: [facility];  Service: Gynecology;  Laterality: N/A;   ANTERIOR AND POSTERIOR REPAIR N/A 03/28/2016   Procedure: ANTERIOR (CYSTOCELE) AND POSTERIOR REPAIR (RECTOCELE);  Surgeon: Nunzio Cobbs, MD;  Location: [facility];  Service: Gynecology;  Laterality: N/A;   AUGMENTATION MAMMAPLASTY  APRIL 2013   BREAST IMPLANT CHANGED   BILATERAL SALPINGECTOMY Bilateral 03/28/2016   Procedure: BILATERAL SALPINGECTOMY;  Surgeon: Nunzio Cobbs, MD;  Location: [facility];  Service:  Gynecology;  Laterality: Bilateral;   BLADDER SUSPENSION N/A 03/28/2016   Procedure: TRANSVAGINAL TAPE (TVT) PROCEDURE exact midurethral sling;  Surgeon: Nunzio Cobbs, MD;  Location: [facility];  Service: Gynecology;  Laterality: N/A;   COLONOSCOPY     CYSTOSCOPY N/A 03/28/2016   Procedure: CYSTOSCOPY;  Surgeon: Nunzio Cobbs, MD;  Location: [facility];  Service: Gynecology;  Laterality: N/A;   CYSTOSCOPY WITH BIOPSY Bilateral 11/03/2015   Procedure: CYSTOSCOPY WITH URETHRAL BIOPSY, FULGERATION, BILATERAL RETROGRADE PYELOGRAMS;  Surgeon: Alexis Frock, MD;  Location: [facility];  Service: Urology;  Laterality: Bilateral;   LYSIS OF ADHESION N/A 03/28/2016   Procedure: LYSIS OF ADHESION;  Surgeon: Nunzio Cobbs, MD;  Location: [facility];  Service: Gynecology;  Laterality: N/A;   MASS EXCISION N/A 11/03/2015   Procedure: EXCISION  URETHRAL MASS;  Surgeon: Alexis Frock, MD;  Location: [facility];  Service: Urology;  Laterality: N/A;   TUBAL LIGATION     VAGINAL HYSTERECTOMY  1999   Patient Active Problem List   Diagnosis Date Noted   Generalized anxiety disorder 09/06/2016   Status post surgery 03/28/2016   Carpal tunnel syndrome 07/02/2014   Essential hypertension, benign 01/12/2013  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   Easy bruisability 06/26/2012   Menopause 06/26/2012   Insomnia 06/26/2012   REFERRING DIAG:  K58.2 (ICD-10-CM) - Irritable bowel syndrome with both constipation and diarrhea  M62.89 (ICD-10-CM) - Pelvic floor dysfunction in female      THERAPY DIAG:  Muscle weakness (generalized)   Other lack of coordination   Rationale for Evaluation and Treatment: Rehabilitation   ONSET DATE: 10/2021   SUBJECTIVE:                                                                                                                                                                                             SUBJECTIVE STATEMENT: I have not been feeling the urge to go to the bathroom. I am doing the massage stuff. I have eaten corn to see when it comes out and it took 4 days.  Fluid intake: Yes: water, coffee, tea, orange juice, ginger ale,   ; 4-5 cups of coffee; water 24 ounces   PAIN:  Are you having pain? No   PRECAUTIONS: None   WEIGHT BEARING RESTRICTIONS: No   FALLS:  Has patient fallen in last 6 months? No   LIVING ENVIRONMENT: Lives with: lives with their family   OCCUPATION: Glass blower/designer   PLOF: Independent   PATIENT GOALS: improve sensation of going to the bathroom   PERTINENT HISTORY:  Abdominal Sacrocolpopexy and anterior and posterior repair 03/28/16; vaginal hysterectomy 1999; osteopenia; IBS; Basel Cell skin     BOWEL MOVEMENT: Pain with bowel movement: No Type of bowel movement:Type (Bristol Stool Scale) type 4, Frequency every 4 days when takes a laxative, Strain No, and Splinting yes Fully empty rectum: No, not a formed stool Leakage: Yes: on occasion ; cough pass gas Pads: Yes: 1 pad per month, wear one to an event for comfort Fiber supplement: Yes: Citrucel   URINATION: Pain with urination: No Fully empty bladder: Yes:   Stream: Weak Urgency: No Frequency: every couple of hours Leakage:  none   INTERCOURSE:Not sexually active     PREGNANCY: Vaginal deliveries 3 Tearing Yes: with first child       OBJECTIVE:      COGNITION: Overall cognitive status: Within functional limits for tasks assessed                              PELVIC ALIGNMENT:   LUMBARAROM/PROM:   A/PROM A/PROM  eval  Extension Decreased by 25%  Right rotation Decreased by 25%   (Blank rows = not tested)   LOWER EXTREMITY ROM: Bilateral hip ROM  is full   LOWER EXTREMITY MMT:   MMT Right eval Left eval  Hip abduction 4/5 4/5  Hip adduction 5/5 5/5    PALPATION:   General  contracts the abdomen with lower abdominal bulging.                  External  Perineal Exam able to contract and bulge minimally                             Internal Pelvic Floor tightness in the sacrotuberous ligament; decreased movement of the puborectalis   Patient confirms identification and approves PT to assess internal pelvic floor and treatment Yes   PELVIC MMT:   MMT eval  Internal Anal Sphincter 2/5  External Anal Sphincter 2/5  Puborectalis Initially was a 0/5 then with manual work was 2/5  (Blank rows = not tested)             TODAY'S TREATMENT:  06/07/22 Neuromuscular re-education: Pelvic floor contraction training: Using the pelvic floor EMG with sensors on the anus in sidely and sitting working on contraction and relaxation. Able to contract to 30 uv for 10 sec in sidley but in sitting for 6 seconds, sitting and facilitating a bowel movement with keeping the pelvic floor relaxed Exercises: Strengthening: Nustep level 5 for 5 minutes while assessing patient  05/08/22 Manual: Soft tissue mobilization: Manual work to the abdomen to release fascial restrictions and release tightness in some structures.  Exercises: Stretches/mobility: Cat cow 10x Childs pose holding 30 sec with breath to expand the pelvic floor Supine happy baby hold 30 sec Supine twist hold 30 sec bil.  Sitting trunk twist hold 30 sec bil.  Strengthening: Nustep level 5 for 5 minutes while assessing patient     05/01/22 Manual: Myofascial release: Using a soft ball to massage the abdomen to improve intestinal periostalis and reduce constipation Tissue rolling of the abdominal tissue Release of the mesenteric root Release of the sac of douglas Exercises: Strengthening: Nustep level 5 for 5 minutes while assessing patient Supine contract the lower abdomen with the upper 10x Supine marching with red band around the knees to engage the lower abdominals more.                                                                                                                                    PATIENT EDUCATION:  05/01/2022 Education details: Access Code: [email or web address] Person educated: Patient Education method: Explanation, Demonstration, Tactile cues, Verbal cues, and Handouts Education comprehension: verbalized understanding, returned demonstration, verbal cues required, tactile cues required, and needs further education   HOME EXERCISE PROGRAM: 05/01/2022 Access Code: [email or web address] URL: [email or web address] Date: 05/01/2022 Prepared by: Earlie Counts   Exercises - Supine Pelvic Floor Contraction  - 3 x daily - 7 x weekly - 1 sets - 10 reps - 10 sec hold - Supine Diaphragmatic  Breathing  - 1 x daily - 7 x weekly - 1 sets - 10 reps - Seated Diaphragmatic Breathing  - 1 x daily - 7 x weekly - 1 sets - 10 reps - Supine Transversus Abdominis Bracing - Hands on Thighs  - 1 x daily - 7 x weekly - 1 sets - 10 reps - 5 sec hold - Seated Lean Back on Swiss Ball  - 1 x daily - 3 x weekly - 2 sets - 10 reps - Supine March with Resistance Band  - 1 x daily - 7 x weekly - 3 sets - 10 reps   Patient Education - Abdominal Massage for Constipation     ASSESSMENT:   CLINICAL IMPRESSION: Patient is a 68 y.o. female who was seen today for physical therapy treatment for IBS and pelvic floor dysfunction. Patient is able to facilitate bowel movement without increased contraction of the pelvic floor. Patient is able to contract the pelvic floor longer in sidely then sitting. She is still having trouble with the feeling of having to have a bowel movement. Patient will benefit from skilled therapy to improve pelvic floor strength and coordination to assist her with having regular bowel movements.    OBJECTIVE IMPAIRMENTS: decreased coordination, decreased endurance, and decreased strength.    ACTIVITY LIMITATIONS: continence and toileting   PARTICIPATION LIMITATIONS: community activity   PERSONAL FACTORS: Age, Fitness, and Time since onset of injury/illness/exacerbation  are also affecting patient's functional outcome.    REHAB POTENTIAL: Excellent   CLINICAL DECISION MAKING: Stable/uncomplicated   EVALUATION COMPLEXITY: Low     GOALS: Goals reviewed with patient? Yes   SHORT TERM GOALS: Target date: 04/26/2022   Patient able to contract the pelvic floor with a lift to improve sensitivity of the anus.  Baseline: Goal status: Met 05/08/22   2.  Patient instructed in abdominal massage to promote peristalic motion of the intestines Baseline:  Goal status: Met 05/01/22   3.  Patient is able to contract the lower abdomen without bulging it.  Baseline:  Goal status: Met 05/08/22     LONG TERM GOALS: Target date: 06/22/2022   Patient independent with advanced HEP for core and pelvic floor strength to improve her toileting Baseline:  Goal status: INITIAL   2.  Patient reports she feels the urge to have a bowel movement prior to the stool being at the anus due to increased sensitivity.  Baseline:  Goal status: ongoing 05/01/22   3.  Patient is able to have a bowel movement every 2-3 days with or without assistance from medication due to improve coordination of the rectum.  Baseline:  Goal status: INITIAL   4.  Patient is able to push the therapist finger out of the rectum due to the improvement of increasing pressure of the muscles.  Baseline:  Goal status: INITIAL   5.  Anal strength >/= 4/5 holding for 40 seconds to improve leakage of gas when she coughs.  Baseline:  Goal status: INITIAL     PLAN:   PT FREQUENCY: 1x/week   PT DURATION: 12 weeks   PLANNED INTERVENTIONS: Therapeutic exercises, Therapeutic activity, Neuromuscular re-education, Patient/Family education, Joint mobilization, Dry Needling, Electrical stimulation, Biofeedback, and Manual therapy   PLAN FOR NEXT SESSION: progress abdominal exercises, see how long she is able to hold pelvic floor contraction, maybe internal to see function of anus   Eulis Foster, PT 06/07/22 4:19  PM   PHYSICAL THERAPY DISCHARGE SUMMARY  Visits from Start of Care: 5  Current functional level related to goals / functional outcomes: See above.    Remaining deficits: See above.    Education / Equipment: HEP   Patient agrees to discharge. Patient goals were not met. Patient is being discharged due to not returning since the last visit. Thank you for the referral. Eulis Foster, PT 08/15/22 4:03 PM

## 2022-06-07 NOTE — Telephone Encounter (Signed)
Pt states she is taking linzess 14mg daily and she is still having a lot of stomach upset and diarrhea. She is wanting to know if there is something else she can try for constipation. Please advise.

## 2022-06-08 ENCOUNTER — Other Ambulatory Visit: Payer: Self-pay

## 2022-06-08 ENCOUNTER — Encounter: Payer: Self-pay | Admitting: Physical Therapy

## 2022-06-08 DIAGNOSIS — I10 Essential (primary) hypertension: Secondary | ICD-10-CM | POA: Diagnosis not present

## 2022-06-08 DIAGNOSIS — Z0001 Encounter for general adult medical examination with abnormal findings: Secondary | ICD-10-CM | POA: Diagnosis not present

## 2022-06-08 DIAGNOSIS — Z6825 Body mass index (BMI) 25.0-25.9, adult: Secondary | ICD-10-CM | POA: Diagnosis not present

## 2022-06-08 DIAGNOSIS — K580 Irritable bowel syndrome with diarrhea: Secondary | ICD-10-CM | POA: Diagnosis not present

## 2022-06-08 DIAGNOSIS — F411 Generalized anxiety disorder: Secondary | ICD-10-CM | POA: Diagnosis not present

## 2022-06-08 DIAGNOSIS — F5101 Primary insomnia: Secondary | ICD-10-CM | POA: Diagnosis not present

## 2022-06-08 DIAGNOSIS — E663 Overweight: Secondary | ICD-10-CM | POA: Diagnosis not present

## 2022-06-08 DIAGNOSIS — Z1331 Encounter for screening for depression: Secondary | ICD-10-CM | POA: Diagnosis not present

## 2022-06-08 MED ORDER — TRULANCE 3 MG PO TABS: 3.0000 mg | ORAL_TABLET | Freq: Two times a day (BID) | ORAL | 3 refills | Status: DC

## 2022-06-08 MED ORDER — TRULANCE 3 MG PO TABS: 3.0000 mg | ORAL_TABLET | Freq: Every day | ORAL | 3 refills | Status: DC

## 2022-06-08 NOTE — Telephone Encounter (Signed)
Prescription for Trulance sent to pharmacy, pt aware.

## 2022-06-13 ENCOUNTER — Encounter: Payer: Self-pay | Admitting: Obstetrics and Gynecology

## 2022-06-13 ENCOUNTER — Ambulatory Visit: Payer: PPO | Admitting: Obstetrics and Gynecology

## 2022-06-13 VITALS — BP 128/82 | HR 82 | Ht 66.0 in | Wt 151.0 lb

## 2022-06-13 DIAGNOSIS — Z780 Asymptomatic menopausal state: Secondary | ICD-10-CM | POA: Diagnosis not present

## 2022-06-13 DIAGNOSIS — Z8619 Personal history of other infectious and parasitic diseases: Secondary | ICD-10-CM | POA: Diagnosis not present

## 2022-06-13 DIAGNOSIS — Z9189 Other specified personal risk factors, not elsewhere classified: Secondary | ICD-10-CM

## 2022-06-13 DIAGNOSIS — M8580 Other specified disorders of bone density and structure, unspecified site: Secondary | ICD-10-CM

## 2022-06-13 DIAGNOSIS — R140 Abdominal distension (gaseous): Secondary | ICD-10-CM | POA: Diagnosis not present

## 2022-06-13 DIAGNOSIS — Z01419 Encounter for gynecological examination (general) (routine) without abnormal findings: Secondary | ICD-10-CM

## 2022-06-13 MED ORDER — ESTRADIOL 0.1 MG/GM VA CREA: TOPICAL_CREAM | VAGINAL | 1 refills | Status: DC

## 2022-06-13 NOTE — Patient Instructions (Signed)

## 2022-06-14 DIAGNOSIS — E039 Hypothyroidism, unspecified: Secondary | ICD-10-CM | POA: Diagnosis not present

## 2022-06-14 DIAGNOSIS — D518 Other vitamin B12 deficiency anemias: Secondary | ICD-10-CM | POA: Diagnosis not present

## 2022-06-14 DIAGNOSIS — Z0001 Encounter for general adult medical examination with abnormal findings: Secondary | ICD-10-CM | POA: Diagnosis not present

## 2022-06-14 DIAGNOSIS — E559 Vitamin D deficiency, unspecified: Secondary | ICD-10-CM | POA: Diagnosis not present

## 2022-06-19 LAB — CYTOLOGY - PAP: Diagnosis: NEGATIVE

## 2022-06-20 NOTE — Telephone Encounter (Signed)
Patient called stated the Trulance is requiring PA

## 2022-06-21 ENCOUNTER — Ambulatory Visit: Payer: PPO | Admitting: Physical Therapy

## 2022-06-22 ENCOUNTER — Encounter: Payer: Self-pay | Admitting: Physical Therapy

## 2022-06-23 ENCOUNTER — Telehealth: Payer: Self-pay

## 2022-06-23 NOTE — Telephone Encounter (Signed)
PA has been submitted and is pending determination, will be updated in additional encounter created.  

## 2022-06-23 NOTE — Telephone Encounter (Signed)
PA request received via CMM for Trulance '3MG'$  tablets  PA has been submitted to [employer] [place] [employer] and is pending determination.   Key: [id number]

## 2022-06-28 NOTE — Progress Notes (Signed)
GYNECOLOGY  VISIT   HPI: 67 y.o.   Widowed  Caucasian  female   G3P3 with No LMP recorded. Patient has had a hysterectomy.   here for   U/S consult for abdominal bloating.  GYNECOLOGIC HISTORY: No LMP recorded. Patient has had a hysterectomy. Contraception:  hysterectomy Menopausal hormone therapy:  estrace Last mammogram: 05/2022 WNL,  11/21/17 Breast Density Category C, BI-RADS CATEGORY 1 Neg Last pap smear:   06/13/22 neg, 05/05/20 ASCUS: HR HPV neg        OB History     Gravida  3   Para  3   Term      Preterm      AB      Living  3      SAB      IAB      Ectopic      Multiple      Live Births                 Patient Active Problem List   Diagnosis Date Noted   Generalized anxiety disorder 09/06/2016   Status post surgery 03/28/2016   Anxiety 08/11/2015   Chondromalacia 08/11/2015   Carpal tunnel syndrome 07/02/2014   Essential hypertension, benign 01/12/2013   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 This is really VIN II and not VAIN II.    Wears contact lenses     Past Surgical History:  Procedure Laterality Date   ABDOMINAL SACROCOLPOPEXY N/A 03/28/2016   Procedure: ABDOMINO SACROCOLPOPEXY;  Surgeon: Nunzio Cobbs, MD;  Location: [facility];  Service: Gynecology;  Laterality: N/A;   ANTERIOR AND POSTERIOR REPAIR N/A 03/28/2016   Procedure: ANTERIOR  (CYSTOCELE) AND POSTERIOR REPAIR (RECTOCELE);  Surgeon: Nunzio Cobbs, MD;  Location: [facility];  Service: Gynecology;  Laterality: N/A;   AUGMENTATION MAMMAPLASTY  APRIL 2013   BREAST IMPLANT CHANGED   BILATERAL SALPINGECTOMY Bilateral 03/28/2016   Procedure: BILATERAL SALPINGECTOMY;  Surgeon: Nunzio Cobbs, MD;  Location: [facility];  Service: Gynecology;  Laterality: Bilateral;   BLADDER SUSPENSION N/A 03/28/2016   Procedure: TRANSVAGINAL TAPE (TVT) PROCEDURE exact midurethral sling;  Surgeon: Nunzio Cobbs, MD;  Location: [facility];  Service: Gynecology;  Laterality: N/A;   COLONOSCOPY     CYSTOSCOPY N/A 03/28/2016   Procedure: CYSTOSCOPY;  Surgeon: Nunzio Cobbs, MD;  Location: [facility];  Service: Gynecology;  Laterality: N/A;   CYSTOSCOPY WITH BIOPSY Bilateral 11/03/2015   Procedure: CYSTOSCOPY WITH URETHRAL BIOPSY, FULGERATION, BILATERAL RETROGRADE PYELOGRAMS;  Surgeon: Alexis Frock, MD;  Location: [facility];  Service: Urology;  Laterality: Bilateral;   LYSIS OF ADHESION N/A 03/28/2016   Procedure: LYSIS OF  ADHESION;  Surgeon: Nunzio Cobbs, MD;  Location: [facility];  Service: Gynecology;  Laterality: N/A;   MASS EXCISION N/A 11/03/2015   Procedure: EXCISION  URETHRAL MASS;  Surgeon: Alexis Frock, MD;  Location: [facility];  Service: Urology;  Laterality: N/A;   TUBAL LIGATION     VAGINAL HYSTERECTOMY  1999    Current Outpatient Medications  Medication Sig Dispense Refill   ALPRAZolam (XANAX) 0.5 MG tablet Take 1 tab p.o. qhs for 2 weeks, then take 1/2 tab p.o. qhs, then take 1/2 tab qod, then discontinue. 30 tablet 0   amLODipine-benazepril (LOTREL) 5-40 MG capsule TAKE (1) CAPSULE BY MOUTH ONCE DAILY. 30 capsule 0   Ascorbic Acid (VITAMIN C) 100 MG tablet Take 100 mg by mouth daily.     diphenoxylate-atropine (LOMOTIL) 2.5-0.025 MG tablet TAKE 2 TABLETS BY MOUTH 4 TIMES DAILY AS NEEDED FOR DIARRHEA OR LOOSE  STOOLS. MAX 8 TABLETS IN 24 HOURS. 30 tablet 0   estradiol (ESTRACE VAGINAL) 0.1 MG/GM vaginal cream Place 1/2 gram vaginally at hs two to three times per week as needed. 42.5 g 1   fluorouracil (EFUDEX) 5 % cream Apply topically daily as needed.     hydrochlorothiazide (HYDRODIURIL) 25 MG tablet TAKE ONE TABLET BY MOUTH ONCE DAILY AS NEEDED. DO NOT EXCEED 3 PER WEEK. APPT REQUIRED FOR FUTURE REFILLS 30 tablet 0   ibuprofen (ADVIL) 800 MG tablet Take 1 tablet (800 mg total) by mouth every 8 (eight) hours as needed. 30 tablet 0   lactobacillus acidophilus (BACID) TABS tablet Take 2 tablets by mouth 3 (three) times daily.     pantoprazole (PROTONIX) 40 MG tablet Take 40 mg by mouth daily.     phentermine (ADIPEX-P) 37.5 MG tablet Take 37.5 mg by mouth daily.     Plecanatide (TRULANCE) 3 MG TABS Take 1 tablet (3 mg total) by mouth daily. 30 tablet 3   tretinoin (RETIN-A) 0.05 % cream      Vitamin D, Ergocalciferol, 50000 units CAPS Take 1 capsule by mouth once a week.     zolpidem (AMBIEN) 10 MG tablet Take 10 mg by mouth daily.     No current facility-administered medications for this visit.     ALLERGIES: Amoxicillin-pot clavulanate and Codeine  Family History  Problem Relation Age of Onset   Hypertension Mother    Osteoporosis Mother    Paget's disease of bone Mother    Heart failure Mother    Cancer Father        MELANOMA   Diabetes Father    Hypertension Father    Heart disease Father    Cancer Sister        THYROID   Breast cancer Paternal Grandmother    Colon cancer Neg Hx    Pancreatic cancer Neg Hx    Esophageal cancer Neg Hx     Social History   Socioeconomic History   Marital status: Widowed    Spouse name: Not on file   Number of children: 3   Years of education: Not on file   Highest education level: Not on file  Occupational History   Not on file  Tobacco Use   Smoking status: Never   Smokeless tobacco: Never  Vaping Use   Vaping Use: Never used   Substance and Sexual Activity   Alcohol use: Yes    Alcohol/week: 8.0 standard drinks of alcohol    Types: 8 Glasses of wine per week   Drug use:  No   Sexual activity: Not Currently    Partners: Male    Birth control/protection: Surgical    Comment: Tubal/TVH--ovaries remain, >5 sexual partners, <16 y/o, yes abnormal pap, no STD  Other Topics Concern   Not on file  Social History Narrative   Not on file   Social Determinants of Health   Financial Resource Strain: Not on file  Food Insecurity: Not on file  Transportation Needs: Not on file  Physical Activity: Not on file  Stress: Not on file  Social Connections: Not on file  Intimate Partner Violence: Not on file    Review of Systems  All other systems reviewed and are negative.   PHYSICAL EXAMINATION:    BP 122/78 (BP Location: Left Arm, Patient Position: Sitting, Cuff Size: Normal)   Pulse 74   Ht 5\' 6"  (1.676 m)   Wt 151 lb (68.5 kg)   SpO2 97%   BMI 24.37 kg/m     General appearance: alert, cooperative and appears stated age   Pelvic US Uterus absent.  Left ovary 2.10 x 0.90 x 1.02 cm.  Right ovary 2.60 x 0.93 x 1.22 cm.  No adnexal masses.  No free fluid.                 ASSESSMENT  Abdominal bloating.  Normal ovaries on pelvic US.   PLAN  Pelvic images and report reviewed.  We reviewed signs and symptoms of ovarian cancer.  She will follow up with her GI for further evaluation of abdominal bloating.  FH here prn.

## 2022-06-30 ENCOUNTER — Ambulatory Visit
Admission: RE | Admit: 2022-06-30 | Discharge: 2022-06-30 | Disposition: A | Discharge status: Home / Self Care | Payer: PPO | Source: Ambulatory Visit | Attending: Obstetrics and Gynecology | Admitting: Obstetrics and Gynecology

## 2022-06-30 DIAGNOSIS — Z1231 Encounter for screening mammogram for malignant neoplasm of breast: Secondary | ICD-10-CM | POA: Diagnosis not present

## 2022-07-05 DIAGNOSIS — Z411 Encounter for cosmetic surgery: Secondary | ICD-10-CM | POA: Diagnosis not present

## 2022-07-05 DIAGNOSIS — D229 Melanocytic nevi, unspecified: Secondary | ICD-10-CM | POA: Diagnosis not present

## 2022-07-05 DIAGNOSIS — L570 Actinic keratosis: Secondary | ICD-10-CM | POA: Diagnosis not present

## 2022-07-05 DIAGNOSIS — L810 Postinflammatory hyperpigmentation: Secondary | ICD-10-CM | POA: Diagnosis not present

## 2022-07-05 DIAGNOSIS — D1801 Hemangioma of skin and subcutaneous tissue: Secondary | ICD-10-CM | POA: Diagnosis not present

## 2022-07-05 DIAGNOSIS — L814 Other melanin hyperpigmentation: Secondary | ICD-10-CM | POA: Diagnosis not present

## 2022-07-05 DIAGNOSIS — L821 Other seborrheic keratosis: Secondary | ICD-10-CM | POA: Diagnosis not present

## 2022-07-05 DIAGNOSIS — L578 Other skin changes due to chronic exposure to nonionizing radiation: Secondary | ICD-10-CM | POA: Diagnosis not present

## 2022-07-11 ENCOUNTER — Ambulatory Visit: Payer: PPO

## 2022-07-11 ENCOUNTER — Encounter: Payer: Self-pay | Admitting: Obstetrics and Gynecology

## 2022-07-11 ENCOUNTER — Ambulatory Visit: Payer: PPO | Admitting: Obstetrics and Gynecology

## 2022-07-11 VITALS — BP 122/78 | HR 74 | Ht 66.0 in | Wt 151.0 lb

## 2022-07-11 DIAGNOSIS — R140 Abdominal distension (gaseous): Secondary | ICD-10-CM

## 2022-08-09 ENCOUNTER — Ambulatory Visit (HOSPITAL_COMMUNITY)
Admission: RE | Admit: 2022-08-09 | Discharge: 2022-08-09 | Disposition: A | Discharge status: Home / Self Care | Payer: PPO | Attending: Internal Medicine | Admitting: Internal Medicine

## 2022-08-09 ENCOUNTER — Encounter (HOSPITAL_COMMUNITY)
Admission: RE | Disposition: A | Discharge status: Home / Self Care | Payer: Self-pay | Source: Home / Self Care | Attending: Internal Medicine

## 2022-08-09 DIAGNOSIS — N8189 Other female genital prolapse: Secondary | ICD-10-CM | POA: Diagnosis not present

## 2022-08-09 DIAGNOSIS — K5902 Outlet dysfunction constipation: Secondary | ICD-10-CM | POA: Diagnosis not present

## 2022-08-09 DIAGNOSIS — K5900 Constipation, unspecified: Secondary | ICD-10-CM | POA: Diagnosis not present

## 2022-08-09 HISTORY — PX: ANAL RECTAL MANOMETRY: SHX6358

## 2022-08-09 SURGERY — MANOMETRY, ANORECTAL

## 2022-08-09 SURGERY — Surgical Case
Anesthesia: *Unknown

## 2022-08-09 NOTE — Progress Notes (Signed)
Anal rectal manometry performed per protocol without complications.  Patient tolerated well.  Balloon expulsion test performed per protocol without complications.  Patient tolerated well.  Unable to expel balloon after 3 minutes 

## 2022-08-13 ENCOUNTER — Encounter (HOSPITAL_COMMUNITY): Payer: Self-pay | Admitting: Internal Medicine

## 2022-08-15 DIAGNOSIS — K5902 Outlet dysfunction constipation: Secondary | ICD-10-CM

## 2022-11-17 ENCOUNTER — Ambulatory Visit
Admission: RE | Admit: 2022-11-17 | Discharge: 2022-11-17 | Disposition: A | Discharge status: Home / Self Care | Payer: PPO | Source: Ambulatory Visit | Attending: Obstetrics and Gynecology | Admitting: Obstetrics and Gynecology

## 2022-11-17 DIAGNOSIS — M8588 Other specified disorders of bone density and structure, other site: Secondary | ICD-10-CM | POA: Diagnosis not present

## 2022-11-17 DIAGNOSIS — E349 Endocrine disorder, unspecified: Secondary | ICD-10-CM | POA: Diagnosis not present

## 2022-11-17 DIAGNOSIS — N958 Other specified menopausal and perimenopausal disorders: Secondary | ICD-10-CM | POA: Diagnosis not present

## 2022-11-17 DIAGNOSIS — Z780 Asymptomatic menopausal state: Secondary | ICD-10-CM

## 2022-11-17 DIAGNOSIS — M8580 Other specified disorders of bone density and structure, unspecified site: Secondary | ICD-10-CM

## 2023-01-18 DIAGNOSIS — Z23 Encounter for immunization: Secondary | ICD-10-CM | POA: Diagnosis not present

## 2023-01-18 DIAGNOSIS — I10 Essential (primary) hypertension: Secondary | ICD-10-CM | POA: Diagnosis not present

## 2023-01-18 DIAGNOSIS — G629 Polyneuropathy, unspecified: Secondary | ICD-10-CM | POA: Diagnosis not present

## 2023-01-18 DIAGNOSIS — F411 Generalized anxiety disorder: Secondary | ICD-10-CM | POA: Diagnosis not present

## 2023-01-18 DIAGNOSIS — E663 Overweight: Secondary | ICD-10-CM | POA: Diagnosis not present

## 2023-01-18 DIAGNOSIS — Z6825 Body mass index (BMI) 25.0-25.9, adult: Secondary | ICD-10-CM | POA: Diagnosis not present

## 2023-01-23 DIAGNOSIS — H35341 Macular cyst, hole, or pseudohole, right eye: Secondary | ICD-10-CM | POA: Diagnosis not present

## 2023-02-13 ENCOUNTER — Encounter (INDEPENDENT_AMBULATORY_CARE_PROVIDER_SITE_OTHER): Payer: Self-pay | Admitting: Ophthalmology

## 2023-02-13 ENCOUNTER — Ambulatory Visit (INDEPENDENT_AMBULATORY_CARE_PROVIDER_SITE_OTHER): Payer: PPO | Admitting: Ophthalmology

## 2023-02-13 DIAGNOSIS — H35033 Hypertensive retinopathy, bilateral: Secondary | ICD-10-CM

## 2023-02-13 DIAGNOSIS — H21233 Degeneration of iris (pigmentary), bilateral: Secondary | ICD-10-CM

## 2023-02-13 DIAGNOSIS — H25813 Combined forms of age-related cataract, bilateral: Secondary | ICD-10-CM

## 2023-02-13 DIAGNOSIS — H35371 Puckering of macula, right eye: Secondary | ICD-10-CM

## 2023-02-13 DIAGNOSIS — I10 Essential (primary) hypertension: Secondary | ICD-10-CM | POA: Diagnosis not present

## 2023-02-13 MED ORDER — BROMFENAC SODIUM 0.07 % OP SOLN: 1.0000 [drp] | Freq: Four times a day (QID) | OPHTHALMIC | 6 refills | Status: DC

## 2023-02-13 MED ORDER — PREDNISOLONE ACETATE 1 % OP SUSP: 1.0000 [drp] | Freq: Four times a day (QID) | OPHTHALMIC | 0 refills | Status: DC

## 2023-02-13 NOTE — Progress Notes (Signed)
[facility] - Clinic Note  02/13/2023   CHIEF COMPLAINT Patient presents for Retina Evaluation  HISTORY OF PRESENT ILLNESS: Stephanie Armstrong is a 67 y.o. female who presents to the clinic today for:  HPI     Retina Evaluation   In right eye.  Duration of 3 weeks.  Associated Symptoms Floaters, Distortion and Redness.  Negative for Flashes, Blind Spot, Pain, Photophobia, Glare, Trauma, Scalp Tenderness, Jaw Claudication, Shoulder/Hip pain, Fever, Weight Loss and Fatigue.  Context:  distance vision and near vision.  Treatments tried include artificial tears.  I, the attending physician,  performed the HPI with the patient and updated documentation appropriately.        Comments   Ret eval per Dr.Cotter. Patient went to see Dr.Cotter for normal eye exam, about a week later she went back to him because she noticed things looked blurry and like mashed together. Patient notices things look wavy like door frame.       Last edited by Rennis Chris, MD on 02/13/2023 11:21 PM.    Pt is here on the referral of Dr. 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   prednisoLONE acetate (PRED FORTE)  1 % ophthalmic suspension Place 1 drop into both eyes 4 (four) times daily.   No current facility-administered medications for this visit. (Ophthalmic Drugs)   Current Outpatient Medications (Other)  Medication Sig   ALPRAZolam (XANAX) 0.5 MG tablet Take 1 tab p.o. qhs for 2 weeks, then take 1/2 tab p.o. qhs, then take 1/2 tab qod, then discontinue.   amLODipine-benazepril (LOTREL) 5-40 MG capsule TAKE (1) CAPSULE BY MOUTH ONCE DAILY.   Ascorbic Acid (VITAMIN C) 100 MG tablet Take 100 mg by mouth daily.   diphenoxylate-atropine (LOMOTIL) 2.5-0.025 MG tablet TAKE 2 TABLETS BY MOUTH 4 TIMES DAILY AS NEEDED FOR DIARRHEA OR LOOSE STOOLS. MAX 8 TABLETS IN 24 HOURS.   estradiol (ESTRACE VAGINAL) 0.1 MG/GM vaginal cream Place 1/2 gram vaginally at hs two to three times per week as needed.   fluorouracil (EFUDEX) 5 % cream Apply topically daily as needed.   hydrochlorothiazide (HYDRODIURIL) 25 MG tablet TAKE ONE TABLET BY MOUTH ONCE DAILY AS NEEDED. DO NOT EXCEED 3 PER WEEK. APPT REQUIRED FOR FUTURE REFILLS   ibuprofen (ADVIL) 800 MG tablet Take 1 tablet (800 mg total) by mouth every 8 (eight) hours as needed.   lactobacillus acidophilus (BACID) TABS tablet Take 2 tablets by mouth 3 (three) times daily.   pantoprazole (PROTONIX) 40 MG tablet Take 40 mg by mouth daily.   phentermine (ADIPEX-P) 37.5 MG tablet Take 37.5 mg by mouth daily.   Plecanatide (TRULANCE) 3 MG TABS Take 1 tablet (3  mg total) by mouth daily.   tretinoin (RETIN-A) 0.05 % cream    Vitamin D, Ergocalciferol, 50000 units CAPS Take 1 capsule by mouth once a week.   zolpidem (AMBIEN) 10 MG tablet Take 10 mg by mouth daily.   No current facility-administered medications for this visit. (Other)   REVIEW OF SYSTEMS: ROS   Positive for: Constitutional, Gastrointestinal, Eyes Negative for: Neurological, Skin, Genitourinary, Musculoskeletal, HENT, Endocrine, Cardiovascular, Respiratory, Psychiatric, Allergic/Imm, Heme/Lymph Last edited  by Lana Fish, COT on 02/13/2023  1:05 PM.     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 This is really VIN II and not VAIN II.    Wears contact lenses    Past Surgical History:  Procedure Laterality Date   ABDOMINAL SACROCOLPOPEXY N/A 03/28/2016   Procedure: ABDOMINO SACROCOLPOPEXY;  Surgeon: Patton Salles, MD;  Location: [facility];  Service: Gynecology;  Laterality: N/A;   ANAL RECTAL MANOMETRY N/A 08/09/2022   Procedure: ANO RECTAL MANOMETRY;  Surgeon: Hilarie Fredrickson, MD;  Location: [facility];  Service: Gastroenterology;  Laterality: N/A;   ANTERIOR AND POSTERIOR REPAIR N/A 03/28/2016   Procedure: ANTERIOR (CYSTOCELE) AND POSTERIOR REPAIR (RECTOCELE);  Surgeon: Patton Salles, MD;  Location: [facility];  Service: Gynecology;  Laterality: N/A;   AUGMENTATION MAMMAPLASTY  APRIL 2013   BREAST IMPLANT CHANGED   BILATERAL SALPINGECTOMY Bilateral 03/28/2016   Procedure: BILATERAL SALPINGECTOMY;  Surgeon: Patton Salles, MD;  Location: [facility];  Service: Gynecology;  Laterality: Bilateral;   BLADDER SUSPENSION N/A 03/28/2016   Procedure: TRANSVAGINAL TAPE (TVT) PROCEDURE exact midurethral sling;  Surgeon: Patton Salles, MD;  Location: [facility];  Service: Gynecology;  Laterality: N/A;   COLONOSCOPY     CYSTOSCOPY N/A 03/28/2016   Procedure:  CYSTOSCOPY;  Surgeon: Patton Salles, MD;  Location: [facility];  Service: Gynecology;  Laterality: N/A;   CYSTOSCOPY WITH BIOPSY Bilateral 11/03/2015   Procedure: CYSTOSCOPY WITH URETHRAL BIOPSY, FULGERATION, BILATERAL RETROGRADE PYELOGRAMS;  Surgeon: Sebastian Ache, MD;  Location: [facility];  Service: Urology;  Laterality: Bilateral;   LYSIS OF ADHESION N/A 03/28/2016   Procedure: LYSIS OF ADHESION;  Surgeon: Patton Salles, MD;  Location: [facility];  Service: Gynecology;  Laterality: N/A;   MASS EXCISION N/A 11/03/2015   Procedure: EXCISION  URETHRAL MASS;  Surgeon: Sebastian Ache, MD;  Location: [facility];  Service: Urology;  Laterality: N/A;   TUBAL LIGATION     VAGINAL HYSTERECTOMY  1999   FAMILY HISTORY Family History  Problem Relation Age of Onset   Hypertension Mother    Osteoporosis Mother    Paget's disease of bone Mother  Heart failure Mother    Cancer Father        MELANOMA   Diabetes Father    Hypertension Father    Heart disease Father    Cancer Sister        THYROID   Breast cancer Paternal Grandmother    Colon cancer Neg Hx    Pancreatic cancer Neg Hx    Esophageal cancer Neg Hx    SOCIAL HISTORY Social History   Tobacco Use   Smoking status: Never   Smokeless tobacco: Never  Vaping Use   Vaping status: Never Used  Substance Use Topics   Alcohol use: Yes    Alcohol/week: 8.0 standard drinks of alcohol    Types: 8 Glasses of wine per week   Drug use: No       OPHTHALMIC EXAM:  Base Eye Exam     Visual Acuity (Snellen - Linear)       Right Left   Dist cc 20/400 20/30   Dist ph cc 20/NI 20/25    Correction: Contacts         Tonometry (Tonopen, 1:19 PM)       Right Left   Pressure 10 11         Pupils       Dark Light Shape React APD   Right 3 2 Round Minimal None   Left 3 2 Round Brisk None         Visual Fields (Counting fingers)       Left Right    Full Full          Extraocular Movement       Right Left    Full, Ortho Full, Ortho         Neuro/Psych     Oriented x3: Yes   Mood/Affect: Normal         Dilation     Both eyes: 1.0% Mydriacyl, 2.5% Phenylephrine @ 1:19 PM           Slit Lamp and Fundus Exam     Slit Lamp Exam       Right Left   Lids/Lashes Dermatochalasis - upper lid, mild MGD Dermatochalasis - upper lid, mild MGD   Conjunctiva/Sclera White and quiet White and quiet   Cornea arcus, 1+ fine Punctate epithelial erosions 1+ Punctate epithelial erosions   Anterior Chamber deep, 3-4+ fine pigment deep, 3-4+ fine pigment   Iris Round and dilated Round and dilated   Lens 2-3+ Nuclear sclerosis, 2-3+ Cortical cataract, focal central PSC 2-3+ Nuclear sclerosis, 2-3+ Cortical cataract   Anterior Vitreous syneresis, +RBCs vs pigment, Posterior vitreous detachment, vitreous condensations syneresis, fine pigment, Posterior vitreous detachment, vitreous condensations         Fundus Exam       Right Left   Disc Pink and Sharp Pink and Sharp, Compact   C/D Ratio 0.2 0.1   Macula Blunted foveal reflex, ERM with central fibrosis and thickening, no heme Flat, Blunted foveal reflex, RPE mottling and clumping, No heme or edema   Vessels attenuated, Tortuous mild attenuation   Periphery Attached, mild paving stone degeneration inferiorly Attached, No heme           Refraction     Manifest Refraction (Auto)       Sphere Cylinder Axis Dist VA   Right +2.25 +1.25 165 20/400   Left +2.00 +0.50 013 20.20           IMAGING AND PROCEDURES  Imaging and  Procedures for 02/13/2023  OCT, Retina - OU - Both Eyes       Right Eye Quality was good. Central Foveal Thickness: 701. Progression has no prior data. Findings include no SRF, abnormal foveal contour, intraretinal hyper-reflective material, epiretinal membrane, intraretinal fluid, macular pucker, preretinal fibrosis (ERM with central thickening and preretinal fibrosis;  central IRHM).   Left Eye Quality was good. Central Foveal Thickness: 273. Progression has no prior data. Findings include normal foveal contour, no IRF, no SRF (Partial PVD).   Notes *Images captured and stored on drive  Diagnosis / Impression:  OD: ERM with retinal thickening and preretinal fibrosis; central IRHM OS: NFP; no IRF/SRF   Clinical management:  See below  Abbreviations: NFP - Normal foveal profile. CME - cystoid macular edema. PED - pigment epithelial detachment. IRF - intraretinal fluid. SRF - subretinal fluid. EZ - ellipsoid zone. ERM - epiretinal membrane. ORA - outer retinal atrophy. ORT - outer retinal tubulation. SRHM - subretinal hyper-reflective material. IRHM - intraretinal hyper-reflective material      Fluorescein Angiography Optos (Transit OD)       Right Eye Progression has no prior data. Early phase findings include delayed filling. Mid/Late phase findings include blockage, staining (Central blockage from fibrosis, No CNV, punctate peripheral staining).   Left Eye Progression has no prior data. Early phase findings include normal observations. Mid/Late phase findings include normal observations.   Notes **Images stored on drive**  Impression: OD: Central blockage from fibrosis, No CNV, punctate peripheral staining OS: normal study          ASSESSMENT/PLAN:   ICD-10-CM   1. Pigment dispersion syndrome of both eyes  H21.233     2. Epiretinal membrane (ERM) of right eye  H35.371 OCT, Retina - OU - Both Eyes    3. Essential hypertension  I10     4. Hypertensive retinopathy of both eyes  H35.033 Fluorescein Angiography Optos (Transit OD)    5. Combined forms of age-related cataract of both eyes  H25.813      1. Epiretinal membrane, right eye   - pt reports 3+ wk history of progressive decrease in vision OD - The natural history, anatomy, potential for loss of vision, and treatment options including vitrectomy techniques and the  complications of endophthalmitis, retinal detachment, vitreous hemorrhage, cataract progression and permanent vision loss discussed with the patient. - exam dense ERM w/ central thickening and preretinal fibrosis - BCVA 20/400 today from 20/70 w/ Dr. Charise Killian - +metamorphopsia - monitor for now  - discussed ERM surgery and recovery - recommend CE/IOL OD prior to PPV for ERM - f/u in 4 wks -- DFE/OCT  2. Pigment Dispersion Syndrome OU  - AC with 3-4+ fine pigment OU  - will start PF and Prolensa QID OU  - f/u in 4 wks  3,4. Hypertensive retinopathy OU - discussed importance of tight BP control - monitor  5. Mixed Cataract OU - The symptoms of cataract, surgical options, and treatments and risks were discussed with patient. - discussed diagnosis and progression - will refer to Dr. Vonna Kotyk for consult -- CEIOL OD to assist with PPV for ERM    Ophthalmic Meds Ordered this visit:  Meds ordered this encounter  Medications   prednisoLONE acetate (PRED FORTE) 1 % ophthalmic suspension    Sig: Place 1 drop into both eyes 4 (four) times daily.    Dispense:  15 mL    Refill:  0   Bromfenac Sodium (PROLENSA) 0.07 % SOLN  Sig: Place 1 drop into both eyes 4 (four) times daily.    Dispense:  3 mL    Refill:  6     Return in about 4 weeks (around 03/13/2023) for f/u pigment dispersion syndrome OU, DFE, OCT.  There are no Patient Instructions on file for this visit.  Explained the diagnoses, plan, and follow up with the patient and they expressed understanding.  Patient expressed understanding of the importance of proper follow up care.   This document serves as a record of services personally performed by Karie Chimera, MD, PhD. It was created on their behalf by Charlette Caffey, COT an ophthalmic technician. The creation of this record is the provider's dictation and/or activities during the visit.    Electronically signed by:  Charlette Caffey, COT  02/13/23 11:27 PM  This  document serves as a record of services personally performed by Karie Chimera, MD, PhD. It was created on their behalf by Glee Arvin. Manson Passey, OA an ophthalmic technician. The creation of this record is the provider's dictation and/or activities during the visit.    Electronically signed by: Glee Arvin. Manson Passey, OA 02/13/23 11:27 PM  Karie Chimera, M.D., Ph.D. Diseases & Surgery of the Retina and Vitreous [facility] 02/13/2023  I have reviewed the above documentation for accuracy and completeness, and I agree with the above. Karie Chimera, M.D., Ph.D. 02/13/23 11:41 PM  Abbreviations: M myopia (nearsighted); A astigmatism; H hyperopia (farsighted); P presbyopia; Mrx spectacle prescription;  CTL contact lenses; OD right eye; OS left eye; OU both eyes  XT exotropia; ET esotropia; PEK punctate epithelial keratitis; PEE punctate epithelial erosions; DES dry eye syndrome; MGD meibomian gland dysfunction; ATs artificial tears; PFAT's preservative free artificial tears; NSC nuclear sclerotic cataract; PSC posterior subcapsular cataract; ERM epi-retinal membrane; PVD posterior vitreous detachment; RD retinal detachment; DM diabetes mellitus; DR diabetic retinopathy; NPDR non-proliferative diabetic retinopathy; PDR proliferative diabetic retinopathy; CSME clinically significant macular edema; DME diabetic macular edema; dbh dot blot hemorrhages; CWS cotton wool spot; POAG primary open angle glaucoma; C/D cup-to-disc ratio; HVF humphrey visual field; GVF goldmann visual field; OCT optical coherence tomography; IOP intraocular pressure; BRVO Branch retinal vein occlusion; CRVO central retinal vein occlusion; CRAO central retinal artery occlusion; BRAO branch retinal artery occlusion; RT retinal tear; SB scleral buckle; PPV pars plana vitrectomy; VH Vitreous hemorrhage; PRP panretinal laser photocoagulation; IVK intravitreal kenalog; VMT vitreomacular traction; MH Macular hole;  NVD  neovascularization of the disc; NVE neovascularization elsewhere; AREDS age related eye disease study; ARMD age related macular degeneration; POAG primary open angle glaucoma; EBMD epithelial/anterior basement membrane dystrophy; ACIOL anterior chamber intraocular lens; IOL intraocular lens; PCIOL posterior chamber intraocular lens; Phaco/IOL phacoemulsification with intraocular lens placement; PRK photorefractive keratectomy; LASIK laser assisted in situ keratomileusis; HTN hypertension; DM diabetes mellitus; COPD chronic obstructive pulmonary disease

## 2023-02-27 NOTE — Progress Notes (Signed)
[facility] - Clinic Note  03/13/2023   CHIEF COMPLAINT Patient presents for Retina Follow Up  HISTORY OF PRESENT ILLNESS: Stephanie Armstrong is a 67 y.o. female who presents to the clinic today for:  HPI     Retina Follow Up   In both eyes.  This started 4 weeks ago.  Duration of 4 weeks.  Since onset it is stable.  I, the attending physician,  performed the HPI with the patient and updated documentation appropriately.        Comments   4 week retina follow up PDS OU and ERM OD pt is reporting no vision changes noticed other than when driving at night things look like a smudge on OD pt has appointment with Dr Vonna Kotyk in February she is using PF 2-3 times per day ou and prolensa 2-3 times per day ou       Last edited by Rennis Chris, MD on 03/13/2023  4:43 PM.    Pt states she has noticed she can see some peripheral vision, but the closer it gets to the center of her vision, the darker her vision gets, she is still using PF and Prolensa  Referring physician: Elfredia Nevins, MD [address] [place],  [place] [address]  HISTORICAL INFORMATION:  Selected notes from the MEDICAL RECORD NUMBER Referred by Dr. 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   prednisoLONE acetate (PRED FORTE) 1 % ophthalmic suspension Place 1 drop into both eyes 4 (four) times daily.   No current facility-administered medications for this visit. (Ophthalmic Drugs)   Current Outpatient Medications (Other)  Medication Sig   ALPRAZolam (XANAX) 0.5 MG tablet Take 1 tab p.o. qhs for 2 weeks, then take 1/2 tab p.o. qhs, then take 1/2 tab qod, then discontinue.   amLODipine-benazepril (LOTREL) 5-40 MG capsule TAKE (1) CAPSULE BY MOUTH ONCE DAILY.   Ascorbic Acid (VITAMIN C) 100 MG  tablet Take 100 mg by mouth daily.   diphenoxylate-atropine (LOMOTIL) 2.5-0.025 MG tablet TAKE 2 TABLETS BY MOUTH 4 TIMES DAILY AS NEEDED FOR DIARRHEA OR LOOSE STOOLS. MAX 8 TABLETS IN 24 HOURS.   estradiol (ESTRACE VAGINAL) 0.1 MG/GM vaginal cream Place 1/2 gram vaginally at hs two to three times per week as needed.   fluorouracil (EFUDEX) 5 % cream Apply topically daily as needed.   hydrochlorothiazide (HYDRODIURIL) 25 MG tablet TAKE ONE TABLET BY MOUTH ONCE DAILY AS NEEDED. DO NOT EXCEED 3 PER WEEK. APPT REQUIRED FOR FUTURE REFILLS   ibuprofen (ADVIL) 800 MG tablet Take 1 tablet (800 mg total) by mouth every 8 (eight) hours as needed.   lactobacillus acidophilus (BACID) TABS tablet Take 2 tablets by mouth 3 (three) times daily.   pantoprazole (PROTONIX) 40 MG tablet Take 40 mg by mouth daily.   phentermine (ADIPEX-P) 37.5 MG tablet Take 37.5 mg by mouth daily.   Plecanatide (TRULANCE) 3 MG TABS Take 1 tablet (3 mg total) by mouth daily.   tretinoin (RETIN-A) 0.05 % cream    Vitamin D, Ergocalciferol, 50000 units CAPS Take 1 capsule by mouth once a week.   zolpidem (AMBIEN) 10 MG tablet Take 10 mg by mouth daily.   No current facility-administered medications for this visit. (Other)   REVIEW OF SYSTEMS: ROS   Positive for: Constitutional,  Gastrointestinal, Eyes Negative for: Neurological, Skin, Genitourinary, Musculoskeletal, HENT, Endocrine, Cardiovascular, Respiratory, Psychiatric, Allergic/Imm, Heme/Lymph Last edited by Etheleen Mayhew, COT on 03/13/2023  1:17 PM.     ALLERGIES Allergies  Allergen Reactions   Amoxicillin-Pot Clavulanate Diarrhea   Codeine Nausea Only and Other (See Comments)   PAST MEDICAL HISTORY Past Medical History:  Diagnosis Date   Allergy    Anxiety    Arthritis    fingers   ASCUS with positive high risk human papillomavirus of vagina dx 10/11/15   Cancer (HCC)    basal cell skin   Hepatitis    age 41 , unknown type   History of vulvar  dysplasia    VIN 2   Hypertension    IBS (irritable bowel syndrome)    Osteopenia 2018   Rectocele    Urethral pain    mass   Urinary incontinence    VAIN II (vaginal intraepithelial neoplasia grade II) dx 08/2015   right and left labia minora. This is really VIN II and not VAIN II.    Wears contact lenses    Past Surgical History:  Procedure Laterality Date   ABDOMINAL SACROCOLPOPEXY N/A 03/28/2016   Procedure: ABDOMINO SACROCOLPOPEXY;  Surgeon: Patton Salles, MD;  Location: [facility];  Service: Gynecology;  Laterality: N/A;   ANAL RECTAL MANOMETRY N/A 08/09/2022   Procedure: ANO RECTAL MANOMETRY;  Surgeon: Hilarie Fredrickson, MD;  Location: [facility];  Service: Gastroenterology;  Laterality: N/A;   ANTERIOR AND POSTERIOR REPAIR N/A 03/28/2016   Procedure: ANTERIOR (CYSTOCELE) AND POSTERIOR REPAIR (RECTOCELE);  Surgeon: Patton Salles, MD;  Location: [facility];  Service: Gynecology;  Laterality: N/A;   AUGMENTATION MAMMAPLASTY  APRIL 2013   BREAST IMPLANT CHANGED   BILATERAL SALPINGECTOMY Bilateral 03/28/2016   Procedure: BILATERAL SALPINGECTOMY;  Surgeon: Patton Salles, MD;  Location: [facility];  Service: Gynecology;  Laterality: Bilateral;   BLADDER SUSPENSION N/A 03/28/2016   Procedure: TRANSVAGINAL TAPE (TVT) PROCEDURE exact midurethral sling;  Surgeon: Patton Salles, MD;  Location: [facility];  Service: Gynecology;  Laterality: N/A;   COLONOSCOPY     CYSTOSCOPY N/A 03/28/2016   Procedure: CYSTOSCOPY;  Surgeon: Patton Salles, MD;  Location: [facility];  Service: Gynecology;  Laterality: N/A;   CYSTOSCOPY WITH BIOPSY Bilateral 11/03/2015   Procedure: CYSTOSCOPY WITH URETHRAL BIOPSY, FULGERATION, BILATERAL RETROGRADE PYELOGRAMS;  Surgeon: Sebastian Ache, MD;  Location[facility];  Service: Urology;  Laterality: Bilateral;   LYSIS OF ADHESION N/A 03/28/2016   Procedure: LYSIS OF ADHESION;  Surgeon: Patton Salles, MD;   Location: [facility];  Service: Gynecology;  Laterality: N/A;   MASS EXCISION N/A 11/03/2015   Procedure: EXCISION  URETHRAL MASS;  Surgeon: Sebastian Ache, MD;  Location: [facility];  Service: Urology;  Laterality: N/A;   TUBAL LIGATION     VAGINAL HYSTERECTOMY  1999   FAMILY HISTORY Family History  Problem Relation Age of Onset   Hypertension Mother    Osteoporosis Mother    Paget's disease of bone Mother    Heart failure Mother    Cancer Father        MELANOMA   Diabetes Father    Hypertension Father    Heart disease Father    Cancer Sister        THYROID   Breast cancer Paternal Grandmother    Colon cancer Neg Hx    Pancreatic cancer  Neg Hx    Esophageal cancer Neg Hx    SOCIAL HISTORY Social History   Tobacco Use   Smoking status: Never   Smokeless tobacco: Never  Vaping Use   Vaping status: Never Used  Substance Use Topics   Alcohol use: Yes    Alcohol/week: 8.0 standard drinks of alcohol    Types: 8 Glasses of wine per week   Drug use: No       OPHTHALMIC EXAM:  Base Eye Exam     Visual Acuity (Snellen - Linear)       Right Left   Dist [place] CF at 3' 20/20 -2   Dist ph [place] NI     Correction: Contacts         Tonometry (Tonopen, 1:23 PM)       Right Left   Pressure 13 12         Pupils       Pupils Dark Light Shape React APD   Right PERRL 3 2 Round Brisk None   Left PERRL 3 2 Round Brisk None         Visual Fields       Left Right    Full Full         Extraocular Movement       Right Left    Full, Ortho Full, Ortho         Neuro/Psych     Oriented x3: Yes   Mood/Affect: Normal         Dilation     Both eyes: 2.5% Phenylephrine @ 1:23 PM           Slit Lamp and Fundus Exam     Slit Lamp Exam       Right Left   Lids/Lashes Dermatochalasis - upper lid, mild MGD Dermatochalasis - upper lid, mild MGD   Conjunctiva/Sclera White and quiet White and quiet   Cornea arcus, 1+ fine Punctate epithelial  erosions 1+ Punctate epithelial erosions   Anterior Chamber deep, 3-4+ fine pigment deep, 3-4+ fine pigment   Iris Round and dilated Round and dilated   Lens 2-3+ Nuclear sclerosis, 2-3+ Cortical cataract, focal central PSC 2-3+ Nuclear sclerosis, 2-3+ Cortical cataract   Anterior Vitreous syneresis, +pigment, Posterior vitreous detachment, vitreous condensations syneresis, fine pigment, Posterior vitreous detachment, vitreous condensations         Fundus Exam       Right Left   Disc Pink and Sharp Pink and Sharp, Compact   C/D Ratio 0.2 0.1   Macula Bullous temporal detachment with corrugations extending to nasal macula (fovea detached), central ERM with PRF Flat, Blunted foveal reflex, RPE mottling and clumping, No heme or edema   Vessels attenuated, Tortuous attenuated, Tortuous   Periphery Bullous temporal detachment from 0800-1030 with bi-lobed HST at 1030 Attached, No heme           IMAGING AND PROCEDURES  Imaging and Procedures for 03/13/2023  OCT, Retina - OU - Both Eyes       Right Eye Quality was good. Progression has worsened. Findings include abnormal foveal contour, intraretinal hyper-reflective material, epiretinal membrane, intraretinal fluid, macular pucker, subretinal fluid, preretinal fibrosis (Bullous temporal detachment involving fovea).   Left Eye Quality was good. Central Foveal Thickness: 272. Progression has no prior data. Findings include normal foveal contour, no IRF, no SRF (Partial PVD).   Notes *Images captured and stored on drive  Diagnosis / Impression:  OD: Bullous temporal detachment involving fovea OS: NFP;  no IRF/SRF   Clinical management:  See below  Abbreviations: NFP - Normal foveal profile. CME - cystoid macular edema. PED - pigment epithelial detachment. IRF - intraretinal fluid. SRF - subretinal fluid. EZ - ellipsoid zone. ERM - epiretinal membrane. ORA - outer retinal atrophy. ORT - outer retinal tubulation. SRHM - subretinal  hyper-reflective material. IRHM - intraretinal hyper-reflective material      Color Fundus Photography Optos - OU - Both Eyes       Right Eye Progression has no prior data. Disc findings include pallor. Macula : detached. Vessels : tortuous vessels, attenuated. Periphery : detachment (Bullous temporal detachment from 0730 to 1100, HSTs at 1030).   Left Eye Progression has no prior data. Disc findings include pallor. Macula : retinal pigment epithelium abnormalities, flat. Vessels : tortuous vessels, attenuated. Periphery : RPE abnormality.   Notes **Images stored on drive**  Impression: OD: Bullous temporal detachment from 0730 to 1100, HSTs at 1030            ASSESSMENT/PLAN:   ICD-10-CM   1. Right retinal detachment  H33.21 OCT, Retina - OU - Both Eyes    Color Fundus Photography Optos - OU - Both Eyes    2. Epiretinal membrane (ERM) of right eye  H35.371 OCT, Retina - OU - Both Eyes    3. Essential hypertension  I10     4. Hypertensive retinopathy of both eyes  H35.033     5. 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   - Potential treatment options including delimiting laser, pneumatic retinopexy, scleral buckle, and vitrectomy, cryotherapy and laser, and the use of air, gas, and oil discussed with patient. - The risks of blindness, loss of vision, infection, hemorrhage, cataract progression or lens displacement were discussed with patient. - pt elects surgical repair -- SBP + 25g PPV/EL/Gas OD under general anesthesia - RBA of procedure discussed, questions answered - informed consent obtained and signed - case scheduled for Thursday, December 5th, [facility] OR  8 - may try to move surgery up to Tuesday or Wednesday evening based on OR availability - f/u December 6th, POD1  2. Epiretinal membrane, right eye  - original exam showed dense ERM w/ central thickening and preretinal fibrosis - BCVA 20/CF today from 20/400 due to RD - +metamorphopsia  3,4. Hypertensive retinopathy OU - discussed importance of tight BP control - monitor  5. Mixed Cataract OU - The symptoms of cataract, surgical options, and treatments and risks were discussed with patient. - discussed diagnosis and progression - has an appt with Dr. Vonna Kotyk in February  Ophthalmic Meds Ordered this visit:  No orders of the defined types were placed in this encounter.    Return in about 10 days (around 03/23/2023) for f/u RD OD, DFE, POV.  There are no Patient Instructions on file for this visit.  Explained the diagnoses, plan, and follow up with the patient and they expressed understanding.  Patient expressed understanding of the importance of proper follow up care.   This document serves as a record of services personally performed by Karie Chimera, MD, PhD. It was created on their behalf by Charlette Caffey, COT an ophthalmic technician.  The creation of this record is the provider's dictation and/or activities during the visit.    Electronically signed by:  Charlette Caffey, COT  03/13/23 4:50 PM  This document serves as a record of services personally performed by Karie Chimera, MD, PhD. It was created on their behalf by Glee Arvin. Manson Passey, OA an ophthalmic technician. The creation of this record is the provider's dictation and/or activities during the visit.    Electronically signed by: Glee Arvin. Manson Passey, OA 03/13/23 4:50 PM  Karie Chimera, M.D., Ph.D. Diseases & Surgery of the Retina and Vitreous [facility] 03/13/2023  I have reviewed the above documentation for accuracy and completeness, and I agree with the above. Karie Chimera, M.D.,  Ph.D. 03/13/23 4:50 PM   Abbreviations: M myopia (nearsighted); A astigmatism; H hyperopia (farsighted); P presbyopia; Mrx spectacle prescription;  CTL contact lenses; OD right eye; OS left eye; OU both eyes  XT exotropia; ET esotropia; PEK punctate epithelial keratitis; PEE punctate epithelial erosions; DES dry eye syndrome; MGD meibomian gland dysfunction; ATs artificial tears; PFAT's preservative free artificial tears; NSC nuclear sclerotic cataract; PSC posterior subcapsular cataract; ERM epi-retinal membrane; PVD posterior vitreous detachment; RD retinal detachment; DM diabetes mellitus; DR diabetic retinopathy; NPDR non-proliferative diabetic retinopathy; PDR proliferative diabetic retinopathy; CSME clinically significant macular edema; DME diabetic macular edema; dbh dot blot hemorrhages; CWS cotton wool spot; POAG primary open angle glaucoma; C/D cup-to-disc ratio; HVF humphrey visual field; GVF goldmann visual field; OCT optical coherence tomography; IOP intraocular pressure; BRVO Branch retinal vein occlusion; CRVO central retinal vein occlusion; CRAO central retinal artery occlusion; BRAO branch retinal artery occlusion; RT retinal tear; SB scleral buckle; PPV pars plana vitrectomy; VH Vitreous hemorrhage; PRP panretinal laser photocoagulation; IVK intravitreal kenalog; VMT vitreomacular traction; MH Macular hole;  NVD neovascularization of the disc; NVE neovascularization elsewhere; AREDS age related eye disease study; ARMD age related macular degeneration; POAG primary open angle glaucoma; EBMD epithelial/anterior basement membrane dystrophy; ACIOL anterior chamber intraocular lens; IOL intraocular lens; PCIOL posterior chamber intraocular lens; Phaco/IOL phacoemulsification with intraocular lens placement; PRK photorefractive keratectomy; LASIK laser assisted in situ keratomileusis; HTN hypertension; DM diabetes mellitus; COPD chronic obstructive pulmonary disease

## 2023-03-13 ENCOUNTER — Encounter (INDEPENDENT_AMBULATORY_CARE_PROVIDER_SITE_OTHER): Payer: Self-pay | Admitting: Ophthalmology

## 2023-03-13 ENCOUNTER — Ambulatory Visit (INDEPENDENT_AMBULATORY_CARE_PROVIDER_SITE_OTHER): Payer: PPO | Admitting: Ophthalmology

## 2023-03-13 DIAGNOSIS — H35033 Hypertensive retinopathy, bilateral: Secondary | ICD-10-CM | POA: Diagnosis not present

## 2023-03-13 DIAGNOSIS — H21233 Degeneration of iris (pigmentary), bilateral: Secondary | ICD-10-CM

## 2023-03-13 DIAGNOSIS — H3321 Serous retinal detachment, right eye: Secondary | ICD-10-CM

## 2023-03-13 DIAGNOSIS — H25813 Combined forms of age-related cataract, bilateral: Secondary | ICD-10-CM

## 2023-03-13 DIAGNOSIS — I10 Essential (primary) hypertension: Secondary | ICD-10-CM | POA: Diagnosis not present

## 2023-03-13 DIAGNOSIS — H35371 Puckering of macula, right eye: Secondary | ICD-10-CM

## 2023-03-20 ENCOUNTER — Encounter (HOSPITAL_COMMUNITY): Payer: Self-pay | Admitting: Ophthalmology

## 2023-03-20 ENCOUNTER — Other Ambulatory Visit: Payer: Self-pay

## 2023-03-20 NOTE — Pre-Procedure Instructions (Signed)
PCP - Dr. Elfredia Nevins Cardiologist - denies  PPM/ICD - denies   Chest x-ray - 02/16/21 EKG - DOS Stress Test - denies ECHO - denies Cardiac Cath - denies  CPAP - denies  DM- denies (Pt takes Ozempic for weight loss) Last dose of Ozempic was 11/26. Pt knows not to take any more doses prior to surgery  ASA/Blood Thinner Instructions:  n/a   ERAS Protcol - clears until 1100  COVID TEST- n/a  Anesthesia review: no  Patient verbally denies any shortness of breath, fever, cough and chest pain during phone call   -------------  SDW INSTRUCTIONS given:  Your procedure is scheduled on 12/5.  Report to [facility] Main Entrance "A" at 1130 A.M., and check in at the Admitting office.  Call this number if you have problems the morning of surgery:  [phone]   Remember:  Do not eat after midnight the night before your surgery  You may drink clear liquids until 1100 AM the morning of your surgery.   Clear liquids allowed are: Water, Non-Citrus Juices (without pulp), Carbonated Beverages, Clear Tea, Black Coffee Only, and Gatorade    Take these medicines the morning of surgery with A SIP OF WATER  Prolensa eye drops  Pred Forte eye drops PRN: Xanax, Protonix  As of today, STOP taking any Aspirin (unless otherwise instructed by your surgeon) Aleve, Naproxen, Ibuprofen, Motrin, Advil, Goody's, BC's, all herbal medications, fish oil, and all vitamins.                      Do not wear jewelry, make up, or nail polish            Do not wear lotions, powders, perfumes/colognes, or deodorant.            Do not shave 48 hours prior to surgery.  Men may shave face and neck.            Do not bring valuables to the hospital.            [facility] is not responsible for any belongings or valuables.  Do NOT Smoke (Tobacco/Vaping) 24 hours prior to your procedure If you use a CPAP at night, you may bring all equipment for your overnight stay.   Contacts, glasses, dentures or  bridgework may not be worn into surgery.      For patients admitted to the hospital, discharge time will be determined by your treatment team.   Patients discharged the day of surgery will not be allowed to drive home, and someone needs to stay with them for 24 hours.    Special instructions:   [facility]- Preparing For Surgery  Before surgery, you can play an important role. Because skin is not sterile, your skin needs to be as free of germs as possible. You can reduce the number of germs on your skin by washing with CHG (chlorahexidine gluconate) Soap before surgery.  CHG is an antiseptic cleaner which kills germs and bonds with the skin to continue killing germs even after washing.    Oral Hygiene is also important to reduce your risk of infection.  Remember - BRUSH YOUR TEETH THE MORNING OF SURGERY WITH YOUR REGULAR TOOTHPASTE  Please do not use if you have an allergy to CHG or antibacterial soaps. If your skin becomes reddened/irritated stop using the CHG.  Do not shave (including legs and underarms) for at least 48 hours prior to first CHG shower. It is OK  to shave your face.  Please follow these instructions carefully.   Shower the NIGHT BEFORE SURGERY and the MORNING OF SURGERY with DIAL Soap.   Pat yourself dry with a CLEAN TOWEL.  Wear CLEAN PAJAMAS to bed the night before surgery  Place CLEAN SHEETS on your bed the night of your first shower and DO NOT SLEEP WITH PETS.   Day of Surgery: Please shower morning of surgery  Wear Clean/Comfortable clothing the morning of surgery Do not apply any deodorants/lotions.   Remember to brush your teeth WITH YOUR REGULAR TOOTHPASTE.   Questions were answered. Patient verbalized understanding of instructions.

## 2023-03-20 NOTE — H&P (Signed)
Stephanie Armstrong is an 67 y.o. female.    Chief Complaint: retinal detachment, RIGHT EYE  HPI: Pt with history of ERM OD, who presented to scheduled retina follow up appointment with a significant decrease in vision and nasal and central visual field loss OD. On dilated exam, was found to have a macula/fovea-involving temporal retinal detachment with a bi-lobed retinal tear at 1030. After a discussion of the findings, prognosis, and treatment options and alternatives, the patient elected to proceed with surgical repair of the retinal detachment -- scleral buckle + 25g PPV w/ endolaser and gas OD under general anesthesia.  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 This is really VIN II and not VAIN II.    Wears contact lenses     Past Surgical History:  Procedure Laterality Date   ABDOMINAL SACROCOLPOPEXY N/A 03/28/2016   Procedure: ABDOMINO SACROCOLPOPEXY;  Surgeon: Patton Salles, MD;  Location: [facility];  Service: Gynecology;  Laterality: N/A;   ANAL RECTAL MANOMETRY N/A 08/09/2022   Procedure: ANO RECTAL MANOMETRY;  Surgeon: Hilarie Fredrickson, MD;  [facility];  Service: Gastroenterology;  Laterality: N/A;   ANTERIOR AND POSTERIOR REPAIR N/A 03/28/2016   Procedure: ANTERIOR (CYSTOCELE) AND POSTERIOR REPAIR (RECTOCELE);  Surgeon: Patton Salles, MD;  Location: [facility];  Service: Gynecology;  Laterality: N/A;   AUGMENTATION MAMMAPLASTY  APRIL 2013   BREAST IMPLANT CHANGED   BILATERAL SALPINGECTOMY Bilateral  03/28/2016   Procedure: BILATERAL SALPINGECTOMY;  Surgeon: Patton Salles, MD;  Location: [facility];  Service: Gynecology;  Laterality: Bilateral;   BLADDER SUSPENSION N/A 03/28/2016   Procedure: TRANSVAGINAL TAPE (TVT) PROCEDURE exact midurethral sling;  Surgeon: Patton Salles, MD;  Location: [facility];  Service: Gynecology;  Laterality: N/A;   COLONOSCOPY     CYSTOSCOPY N/A 03/28/2016   Procedure: CYSTOSCOPY;  Surgeon: Patton Salles, MD;  Location: [facility];  Service: Gynecology;  Laterality: N/A;   CYSTOSCOPY WITH BIOPSY Bilateral 11/03/2015   Procedure: CYSTOSCOPY WITH URETHRAL BIOPSY, FULGERATION, BILATERAL RETROGRADE PYELOGRAMS;  Surgeon: Sebastian Ache, MD;  Location[facility];  Service: Urology;  Laterality: Bilateral;   LYSIS OF ADHESION N/A 03/28/2016   Procedure: LYSIS OF ADHESION;  Surgeon: Patton Salles, MD;  Location: [facility];  Service: Gynecology;  Laterality: N/A;   MASS EXCISION N/A 11/03/2015   Procedure: EXCISION  URETHRAL MASS;  Surgeon: Sebastian Ache, MD;  [facility];  Service: Urology;  Laterality: N/A;   TUBAL LIGATION     VAGINAL HYSTERECTOMY  1999    Family History  Problem Relation Age of Onset   Hypertension Mother    Osteoporosis Mother    Paget's disease of bone Mother    Heart failure Mother    Cancer Father  MELANOMA   Diabetes Father    Hypertension Father    Heart disease Father    Cancer Sister        THYROID   Breast cancer Paternal Grandmother    Colon cancer Neg Hx    Pancreatic cancer Neg Hx    Esophageal cancer Neg Hx    Social History:  reports that she has never smoked. She has never used smokeless tobacco. She reports that she does not currently use alcohol. She reports that she does not use drugs.  Allergies:  Allergies  Allergen Reactions   Amoxicillin-Pot Clavulanate Diarrhea   Codeine Nausea Only    No medications prior to admission.    Review  of systems otherwise negative  Height 5\' 6"  (1.676 m), weight 66.2 kg.  Physical exam: Mental status: oriented x3. Eyes: See eye exam associated with this date of surgery Ears, Nose, Throat: within normal limits Neck: Within Normal limits General: within normal limits Chest: Within normal limits Breast: deferred Heart: Within normal limits Abdomen: Within normal limits GU: deferred Extremities: within normal limits Skin: within normal limits  Assessment/Plan 1. Retinal detachment, RIGHT EYE Plan: To [facility] for scleral buckle procedure + 25g PPV w/ endolaser and gas OD under general anesthesia  Karie Chimera, M.D., Ph.D. Vitreoretinal Surgeon [facility]

## 2023-03-22 ENCOUNTER — Other Ambulatory Visit: Payer: Self-pay

## 2023-03-22 ENCOUNTER — Ambulatory Visit (HOSPITAL_COMMUNITY)
Admission: RE | Admit: 2023-03-22 | Discharge: 2023-03-22 | Disposition: A | Discharge status: Home / Self Care | Payer: PPO | Attending: Ophthalmology | Admitting: Ophthalmology

## 2023-03-22 ENCOUNTER — Encounter (HOSPITAL_COMMUNITY)
Admission: RE | Disposition: A | Discharge status: Home / Self Care | Payer: Self-pay | Source: Home / Self Care | Attending: Ophthalmology

## 2023-03-22 ENCOUNTER — Encounter (HOSPITAL_COMMUNITY): Payer: Self-pay | Admitting: Ophthalmology

## 2023-03-22 ENCOUNTER — Ambulatory Visit (HOSPITAL_BASED_OUTPATIENT_CLINIC_OR_DEPARTMENT_OTHER): Payer: PPO | Admitting: Registered Nurse

## 2023-03-22 ENCOUNTER — Ambulatory Visit (HOSPITAL_COMMUNITY): Payer: PPO | Admitting: Registered Nurse

## 2023-03-22 DIAGNOSIS — K219 Gastro-esophageal reflux disease without esophagitis: Secondary | ICD-10-CM | POA: Diagnosis not present

## 2023-03-22 DIAGNOSIS — H33021 Retinal detachment with multiple breaks, right eye: Secondary | ICD-10-CM | POA: Insufficient documentation

## 2023-03-22 DIAGNOSIS — H33001 Unspecified retinal detachment with retinal break, right eye: Secondary | ICD-10-CM

## 2023-03-22 DIAGNOSIS — I10 Essential (primary) hypertension: Secondary | ICD-10-CM | POA: Diagnosis not present

## 2023-03-22 DIAGNOSIS — Z79899 Other long term (current) drug therapy: Secondary | ICD-10-CM | POA: Insufficient documentation

## 2023-03-22 DIAGNOSIS — H3321 Serous retinal detachment, right eye: Secondary | ICD-10-CM | POA: Diagnosis not present

## 2023-03-22 HISTORY — PX: SCLERAL BUCKLE: SHX5340

## 2023-03-22 HISTORY — PX: GAS INSERTION: SHX5336

## 2023-03-22 HISTORY — PX: VITRECTOMY 25 GAUGE WITH SCLERAL BUCKLE: SHX6183

## 2023-03-22 LAB — CBC
HCT: 41.1 % (ref 36.0–46.0)
Hemoglobin: 13.4 g/dL (ref 12.0–15.0)
MCH: 29.9 pg (ref 26.0–34.0)
MCHC: 32.6 g/dL (ref 30.0–36.0)
MCV: 91.7 fL (ref 80.0–100.0)
Platelets: 242 10*3/uL (ref 150–400)
RBC: 4.48 MIL/uL (ref 3.87–5.11)
RDW: 12.1 % (ref 11.5–15.5)
WBC: 5.2 10*3/uL (ref 4.0–10.5)
nRBC: 0 % (ref 0.0–0.2)

## 2023-03-22 LAB — COMPREHENSIVE METABOLIC PANEL
ALT: 12 U/L (ref 0–44)
AST: 17 U/L (ref 15–41)
Albumin: 3.8 g/dL (ref 3.5–5.0)
Alkaline Phosphatase: 53 U/L (ref 38–126)
Anion gap: 8 (ref 5–15)
BUN: 18 mg/dL (ref 8–23)
CO2: 22 mmol/L (ref 22–32)
Calcium: 9.3 mg/dL (ref 8.9–10.3)
Chloride: 108 mmol/L (ref 98–111)
Creatinine, Ser: 0.66 mg/dL (ref 0.44–1.00)
GFR, Estimated: >60 mL/min (ref >60)
Glucose, Bld: 88 mg/dL (ref 70–99)
Potassium: 3.6 mmol/L (ref 3.5–5.1)
Sodium: 138 mmol/L (ref 135–145)
Total Bilirubin: 0.8 mg/dL (ref <1.2)
Total Protein: 6.4 g/dL — ABNORMAL LOW (ref 6.5–8.1)

## 2023-03-22 SURGERY — VITRECTOMY, USING 25-GAUGE INSTRUMENTS, WITH SCLERAL BUCKLING
Anesthesia: General | Site: Eye | Laterality: Right

## 2023-03-22 MED ORDER — BACITRACIN-POLYMYXIN B 500-10000 UNIT/GM OP OINT
TOPICAL_OINTMENT | OPHTHALMIC | Status: AC
  Filled 2023-03-22: qty 3.5

## 2023-03-22 MED ORDER — PHENYLEPHRINE HCL-NACL 20-0.9 MG/250ML-% IV SOLN
INTRAVENOUS | Status: DC | PRN
  Administered 2023-03-22 (×3): 80 ug via INTRAVENOUS
  Administered 2023-03-22: 30 ug/min via INTRAVENOUS

## 2023-03-22 MED ORDER — BUPIVACAINE HCL (PF) 0.75 % IJ SOLN
INTRAMUSCULAR | Status: AC
  Filled 2023-03-22: qty 10

## 2023-03-22 MED ORDER — PHENYLEPHRINE HCL 10 % OP SOLN
1.0000 [drp] | OPHTHALMIC | Status: AC | PRN
  Administered 2023-03-22 (×3): 1 [drp] via OPHTHALMIC
  Filled 2023-03-22: qty 5

## 2023-03-22 MED ORDER — PROPOFOL 10 MG/ML IV BOLUS
INTRAVENOUS | Status: AC
  Filled 2023-03-22: qty 20

## 2023-03-22 MED ORDER — DEXMEDETOMIDINE HCL IN NACL 80 MCG/20ML IV SOLN
INTRAVENOUS | Status: DC | PRN
Start: 2023-03-22 — End: 2023-03-22
  Administered 2023-03-22 (×2): 10 ug via INTRAVENOUS

## 2023-03-22 MED ORDER — PROPOFOL 10 MG/ML IV BOLUS
INTRAVENOUS | Status: DC | PRN
  Administered 2023-03-22: 150 mg via INTRAVENOUS

## 2023-03-22 MED ORDER — EPHEDRINE SULFATE-NACL 50-0.9 MG/10ML-% IV SOSY
PREFILLED_SYRINGE | INTRAVENOUS | Status: DC | PRN
  Administered 2023-03-22: 5 mg via INTRAVENOUS

## 2023-03-22 MED ORDER — ONDANSETRON HCL 4 MG/2ML IJ SOLN
INTRAMUSCULAR | Status: DC | PRN
  Administered 2023-03-22: 4 mg via INTRAVENOUS

## 2023-03-22 MED ORDER — OXYCODONE HCL 5 MG/5ML PO SOLN: 5.0000 mg | Freq: Once | ORAL | Status: DC | PRN

## 2023-03-22 MED ORDER — LIDOCAINE 2% (20 MG/ML) 5 ML SYRINGE
INTRAMUSCULAR | Status: DC | PRN
  Administered 2023-03-22: 60 mg via INTRAVENOUS

## 2023-03-22 MED ORDER — BRIMONIDINE TARTRATE 0.2 % OP SOLN
OPHTHALMIC | Status: DC | PRN
  Administered 2023-03-22: 1 [drp] via OPHTHALMIC

## 2023-03-22 MED ORDER — BUPIVACAINE HCL (PF) 0.75 % IJ SOLN
INTRAMUSCULAR | Status: DC | PRN
  Administered 2023-03-22: 5 mL

## 2023-03-22 MED ORDER — PREDNISOLONE ACETATE 1 % OP SUSP
OPHTHALMIC | Status: DC | PRN
  Administered 2023-03-22: 2 [drp] via OPHTHALMIC

## 2023-03-22 MED ORDER — ACETAZOLAMIDE SODIUM 500 MG IJ SOLR
INTRAMUSCULAR | Status: AC
Start: 2023-03-22 — End: ?
  Filled 2023-03-22: qty 500

## 2023-03-22 MED ORDER — ATROPINE SULFATE 1 % OP SOLN
OPHTHALMIC | Status: AC
Start: 2023-03-22 — End: ?
  Filled 2023-03-22: qty 5

## 2023-03-22 MED ORDER — FENTANYL CITRATE (PF) 250 MCG/5ML IJ SOLN
INTRAMUSCULAR | Status: AC
  Filled 2023-03-22: qty 5

## 2023-03-22 MED ORDER — DORZOLAMIDE HCL-TIMOLOL MAL 2-0.5 % OP SOLN
OPHTHALMIC | Status: DC | PRN
  Administered 2023-03-22: 1 [drp] via OPHTHALMIC

## 2023-03-22 MED ORDER — SODIUM CHLORIDE (PF) 0.9 % IJ SOLN
INTRAMUSCULAR | Status: DC | PRN
  Administered 2023-03-22: 10 mL

## 2023-03-22 MED ORDER — PROPARACAINE HCL 0.5 % OP SOLN
1.0000 [drp] | OPHTHALMIC | Status: AC | PRN
  Administered 2023-03-22 (×3): 1 [drp] via OPHTHALMIC
  Filled 2023-03-22: qty 15

## 2023-03-22 MED ORDER — EPINEPHRINE PF 1 MG/ML IJ SOLN
INTRAMUSCULAR | Status: AC
  Filled 2023-03-22: qty 1

## 2023-03-22 MED ORDER — GATIFLOXACIN 0.5 % OP SOLN
OPHTHALMIC | Status: AC
  Filled 2023-03-22: qty 2.5

## 2023-03-22 MED ORDER — ROCURONIUM BROMIDE 10 MG/ML (PF) SYRINGE
PREFILLED_SYRINGE | INTRAVENOUS | Status: DC | PRN
  Administered 2023-03-22: 10 mg via INTRAVENOUS
  Administered 2023-03-22: 30 mg via INTRAVENOUS
  Administered 2023-03-22: 50 mg via INTRAVENOUS
  Administered 2023-03-22: 20 mg via INTRAVENOUS

## 2023-03-22 MED ORDER — ATROPINE SULFATE 1 % OP SOLN
1.0000 [drp] | OPHTHALMIC | Status: AC | PRN
  Administered 2023-03-22 (×3): 1 [drp] via OPHTHALMIC
  Filled 2023-03-22: qty 2

## 2023-03-22 MED ORDER — ATROPINE SULFATE 1 % OP SOLN
OPHTHALMIC | Status: DC | PRN
  Administered 2023-03-22: 2 [drp] via OPHTHALMIC

## 2023-03-22 MED ORDER — HYDROCODONE-ACETAMINOPHEN 5-325 MG PO TABS: 1.0000 | ORAL_TABLET | ORAL | 0 refills | Status: DC | PRN

## 2023-03-22 MED ORDER — DEXAMETHASONE SODIUM PHOSPHATE 10 MG/ML IJ SOLN
INTRAMUSCULAR | Status: AC
  Filled 2023-03-22: qty 1

## 2023-03-22 MED ORDER — NA CHONDROIT SULF-NA HYALURON 40-30 MG/ML IO SOSY
0.5000 mL | INTRAOCULAR | Status: DC
  Filled 2023-03-22: qty 0.5

## 2023-03-22 MED ORDER — DEXAMETHASONE SODIUM PHOSPHATE 10 MG/ML IJ SOLN
INTRAMUSCULAR | Status: DC | PRN
  Administered 2023-03-22: 5 mg via INTRAVENOUS

## 2023-03-22 MED ORDER — STERILE WATER FOR INJECTION IJ SOLN
INTRAMUSCULAR | Status: AC
Start: 2023-03-22 — End: ?
  Filled 2023-03-22: qty 10

## 2023-03-22 MED ORDER — EPINEPHRINE PF 1 MG/ML IJ SOLN: INTRAOCULAR | Status: DC | PRN

## 2023-03-22 MED ORDER — PREDNISOLONE ACETATE 1 % OP SUSP
OPHTHALMIC | Status: AC
Start: 2023-03-22 — End: ?
  Filled 2023-03-22: qty 5

## 2023-03-22 MED ORDER — SUGAMMADEX SODIUM 200 MG/2ML IV SOLN
INTRAVENOUS | Status: DC | PRN
  Administered 2023-03-22: 200 mg via INTRAVENOUS

## 2023-03-22 MED ORDER — FENTANYL CITRATE (PF) 100 MCG/2ML IJ SOLN
25.0000 ug | INTRAMUSCULAR | Status: DC | PRN
Start: 2023-03-22 — End: 2023-03-23

## 2023-03-22 MED ORDER — LIDOCAINE HCL (PF) 1 % IJ SOLN
INTRAMUSCULAR | Status: DC | PRN
  Administered 2023-03-22: 5 mL

## 2023-03-22 MED ORDER — MIDAZOLAM HCL 2 MG/2ML IJ SOLN
INTRAMUSCULAR | Status: DC | PRN
  Administered 2023-03-22: 2 mg via INTRAVENOUS

## 2023-03-22 MED ORDER — STERILE WATER FOR INJECTION IJ SOLN
INTRAMUSCULAR | Status: DC | PRN
  Administered 2023-03-22: 10 mL

## 2023-03-22 MED ORDER — BSS IO SOLN
INTRAOCULAR | Status: AC
  Filled 2023-03-22: qty 15

## 2023-03-22 MED ORDER — CARBACHOL 0.01 % IO SOLN
INTRAOCULAR | Status: AC
  Filled 2023-03-22: qty 1.5

## 2023-03-22 MED ORDER — SODIUM CHLORIDE 0.9 % IV SOLN
INTRAVENOUS | Status: DC
Start: 2023-03-22 — End: 2023-03-23

## 2023-03-22 MED ORDER — OXYCODONE HCL 5 MG PO TABS
5.0000 mg | ORAL_TABLET | Freq: Once | ORAL | Status: DC | PRN
Start: 2023-03-22 — End: 2023-03-23

## 2023-03-22 MED ORDER — POLYMYXIN B SULFATE 500000 UNITS IJ SOLR
INTRAMUSCULAR | Status: AC
  Filled 2023-03-22: qty 10

## 2023-03-22 MED ORDER — CHLORHEXIDINE GLUCONATE 0.12 % MT SOLN
OROMUCOSAL | Status: AC
  Administered 2023-03-22: 15 mL via OROMUCOSAL
  Filled 2023-03-22: qty 15

## 2023-03-22 MED ORDER — SODIUM CHLORIDE (PF) 0.9 % IJ SOLN
INTRAMUSCULAR | Status: AC
  Filled 2023-03-22: qty 10

## 2023-03-22 MED ORDER — LIDOCAINE HCL 1 % IJ SOLN
INTRAMUSCULAR | Status: AC
  Filled 2023-03-22: qty 20

## 2023-03-22 MED ORDER — TRIAMCINOLONE ACETONIDE 40 MG/ML IJ SUSP
INTRAMUSCULAR | Status: DC | PRN
  Administered 2023-03-22: .5 mL

## 2023-03-22 MED ORDER — CHLORHEXIDINE GLUCONATE 0.12 % MT SOLN: 15.0000 mL | Freq: Once | OROMUCOSAL | Status: AC

## 2023-03-22 MED ORDER — CEFTAZIDIME 1 G IJ SOLR
INTRAMUSCULAR | Status: AC
  Filled 2023-03-22: qty 1

## 2023-03-22 MED ORDER — BACITRACIN-POLYMYXIN B 500-10000 UNIT/GM OP OINT
TOPICAL_OINTMENT | OPHTHALMIC | Status: DC | PRN
  Administered 2023-03-22: 1 via OPHTHALMIC

## 2023-03-22 MED ORDER — NA CHONDROIT SULF-NA HYALURON 40-30 MG/ML IO SOSY
INTRAOCULAR | Status: AC
  Filled 2023-03-22: qty 1

## 2023-03-22 MED ORDER — ONDANSETRON HCL 4 MG/2ML IJ SOLN: 4.0000 mg | Freq: Once | INTRAMUSCULAR | Status: DC | PRN

## 2023-03-22 MED ORDER — TRIAMCINOLONE ACETONIDE 40 MG/ML IJ SUSP
INTRAMUSCULAR | Status: AC
  Filled 2023-03-22: qty 5

## 2023-03-22 MED ORDER — BSS PLUS IO SOLN
INTRAOCULAR | Status: AC
  Filled 2023-03-22: qty 500

## 2023-03-22 MED ORDER — STERILE WATER FOR INJECTION IJ SOLN
INTRAMUSCULAR | Status: DC | PRN
  Administered 2023-03-22: 20 mL

## 2023-03-22 MED ORDER — DORZOLAMIDE HCL-TIMOLOL MAL 2-0.5 % OP SOLN
OPHTHALMIC | Status: AC
  Filled 2023-03-22: qty 10

## 2023-03-22 MED ORDER — GATIFLOXACIN 0.5 % OP SOLN
OPHTHALMIC | Status: DC | PRN
  Administered 2023-03-22: 1 [drp] via OPHTHALMIC

## 2023-03-22 MED ORDER — ORAL CARE MOUTH RINSE: 15.0000 mL | Freq: Once | OROMUCOSAL | Status: AC

## 2023-03-22 MED ORDER — BRIMONIDINE TARTRATE 0.2 % OP SOLN
OPHTHALMIC | Status: AC
  Filled 2023-03-22: qty 5

## 2023-03-22 MED ORDER — TROPICAMIDE 1 % OP SOLN
1.0000 [drp] | OPHTHALMIC | Status: AC | PRN
  Administered 2023-03-22 (×3): 1 [drp] via OPHTHALMIC
  Filled 2023-03-22: qty 15

## 2023-03-22 MED ORDER — BSS IO SOLN
INTRAOCULAR | Status: DC | PRN
  Administered 2023-03-22: 30 mL via INTRAOCULAR

## 2023-03-22 MED ORDER — FENTANYL CITRATE (PF) 250 MCG/5ML IJ SOLN
INTRAMUSCULAR | Status: DC | PRN
  Administered 2023-03-22: 50 ug via INTRAVENOUS
  Administered 2023-03-22: 150 ug via INTRAVENOUS

## 2023-03-22 MED ORDER — ACETAMINOPHEN 500 MG PO TABS
1000.0000 mg | ORAL_TABLET | Freq: Once | ORAL | Status: AC
  Administered 2023-03-22: 1000 mg via ORAL
  Filled 2023-03-22: qty 2

## 2023-03-22 MED ORDER — MIDAZOLAM HCL 2 MG/2ML IJ SOLN
INTRAMUSCULAR | Status: AC
Start: 2023-03-22 — End: ?
  Filled 2023-03-22: qty 2

## 2023-03-22 MED ORDER — NA CHONDROIT SULF-NA HYALURON 40-30 MG/ML IO SOSY
INTRAOCULAR | Status: DC | PRN
  Administered 2023-03-22 (×2): .5 mL via INTRAOCULAR

## 2023-03-22 SURGICAL SUPPLY — 54 items
APPLICATOR COTTON TIP 6 STRL (MISCELLANEOUS) ×12 IMPLANT
APPLICATOR COTTON TIP 6IN STRL (MISCELLANEOUS) ×8
BAND SCLERAL BUCKLING TYPE 41 (Ophthalmic Related) IMPLANT
BAND WRIST GAS GREEN (MISCELLANEOUS) IMPLANT
BETADINE 5% OPHTHALMIC (OPHTHALMIC) ×6 IMPLANT
BNDG EYE OVAL 2 1/8 X 2 5/8 (GAUZE/BANDAGES/DRESSINGS) IMPLANT
CABLE BIPOLOR RESECTION CORD (MISCELLANEOUS) IMPLANT
CANNULA DUALBORE 25G (CANNULA) ×3 IMPLANT
CANNULA FLEX TIP 25G (CANNULA) ×3 IMPLANT
CLSR STERI-STRIP ANTIMIC 1/2X4 (GAUZE/BANDAGES/DRESSINGS) ×3 IMPLANT
COVER SURGICAL LIGHT HANDLE (MISCELLANEOUS) ×3 IMPLANT
DRAPE INCISE 51X51 W/FILM STRL (DRAPES) ×3 IMPLANT
DRAPE MICROSCOPE LEICA 46X105 (MISCELLANEOUS) ×3 IMPLANT
DRAPE OPHTHALMIC 77X100 STRL (CUSTOM PROCEDURE TRAY) ×3 IMPLANT
FILTER BLUE MILLIPORE (MISCELLANEOUS) IMPLANT
FILTER STRAW FLUID ASPIR (MISCELLANEOUS) IMPLANT
FORCEPS GRIESHABER ILM 25G A (INSTRUMENTS) IMPLANT
GAS AUTO FILL CONSTEL (OPHTHALMIC)
GAS AUTO FILL CONSTELLATION (OPHTHALMIC) IMPLANT
GLOVE BIO SURGEON STRL SZ7.5 (GLOVE) ×6 IMPLANT
GLOVE BIOGEL M 7.0 STRL (GLOVE) ×3 IMPLANT
GOWN STRL REUS W/ TWL LRG LVL3 (GOWN DISPOSABLE) ×6 IMPLANT
GOWN STRL REUS W/ TWL XL LVL3 (GOWN DISPOSABLE) ×3 IMPLANT
KIT BASIN OR (CUSTOM PROCEDURE TRAY) ×3 IMPLANT
KIT PERFLUORON PROCEDURE 5ML (MISCELLANEOUS) IMPLANT
LENS VITRECTOMY FLAT OCLR DISP (MISCELLANEOUS) IMPLANT
NDL 18GX1X1/2 (RX/OR ONLY) (NEEDLE) ×6 IMPLANT
NDL 25GX 5/8IN NON SAFETY (NEEDLE) ×3 IMPLANT
NDL FILTER BLUNT 18X1 1/2 (NEEDLE) ×3 IMPLANT
NDL HYPO 30X.5 LL (NEEDLE) IMPLANT
NEEDLE 18GX1X1/2 (RX/OR ONLY) (NEEDLE) ×4 IMPLANT
NEEDLE 25GX 5/8IN NON SAFETY (NEEDLE) ×2 IMPLANT
NEEDLE FILTER BLUNT 18X1 1/2 (NEEDLE) ×2 IMPLANT
NEEDLE HYPO 30X.5 LL (NEEDLE) IMPLANT
NS IRRIG 1000ML POUR BTL (IV SOLUTION) ×3 IMPLANT
PACK VITRECTOMY CUSTOM (CUSTOM PROCEDURE TRAY) ×3 IMPLANT
PAD ARMBOARD 7.5X6 YLW CONV (MISCELLANEOUS) ×6 IMPLANT
PAK PIK VITRECTOMY CVS 25GA (OPHTHALMIC) ×3 IMPLANT
PIC ILLUMINATED 25G (OPHTHALMIC)
PIK ILLUMINATED 25G (OPHTHALMIC) IMPLANT
PROBE ENDO DIATHERMY 25G (MISCELLANEOUS) IMPLANT
PROBE LASER ILLUM FLEX CVD 25G (OPHTHALMIC) IMPLANT
SHIELD EYE LENSE ONLY DISP (GAUZE/BANDAGES/DRESSINGS) IMPLANT
SUT ETHILON 5 0 CL P 3 (SUTURE) IMPLANT
SUT ETHILON 5.0 S-24 (SUTURE) ×3 IMPLANT
SUT ETHILON 9 0 TG140 8 (SUTURE) IMPLANT
SUT SILK 2-0 18XBRD TIE 12 (SUTURE) ×3 IMPLANT
SUT SILK 2-0 18XBRD TIE BLK (SUTURE) ×3 IMPLANT
SUT VICRYL 7 0 TG140 8 (SUTURE) ×3 IMPLANT
SYR 10ML LL (SYRINGE) ×6 IMPLANT
SYR TB 1ML LUER SLIP (SYRINGE) ×3 IMPLANT
TOWEL GREEN STERILE FF (TOWEL DISPOSABLE) ×3 IMPLANT
TRAY FOLEY W/BAG SLVR 14FR (SET/KITS/TRAYS/PACK) IMPLANT
WATER STERILE IRR 1000ML POUR (IV SOLUTION) ×3 IMPLANT

## 2023-03-22 NOTE — Anesthesia Procedure Notes (Signed)
Procedure Name: Intubation Date/Time: 03/22/2023 3:41 PM  Performed by: Loleta Joia Doyle, CRNAPre-anesthesia Checklist: Patient identified, Patient being monitored, Timeout performed, Emergency Drugs available and Suction available Patient Re-evaluated:Patient Re-evaluated prior to induction Oxygen Delivery Method: Circle system utilized Preoxygenation: Pre-oxygenation with 100% oxygen Induction Type: IV induction Ventilation: Mask ventilation without difficulty Laryngoscope Size: Miller and 2 Grade View: Grade I Tube type: Oral Tube size: 7.0 mm Number of attempts: 1 Airway Equipment and Method: Stylet Placement Confirmation: ETT inserted through vocal cords under direct vision, positive ETCO2 and breath sounds checked- equal and bilateral Secured at: 22 cm Tube secured with: Tape Dental Injury: Teeth and Oropharynx as per pre-operative assessment

## 2023-03-22 NOTE — Interval H&P Note (Signed)
History and Physical Interval Note:  03/22/2023 3:02 PM  Stephanie Armstrong  has presented today for surgery, with the diagnosis of retinal detachment, right eye.  The various methods of treatment have been discussed with the patient and family. After consideration of risks, benefits and other options for treatment, the patient has consented to  Procedure(s): VITRECTOMY 25 GAUGE WITH SCLERAL BUCKLE (Right) as a surgical intervention.  The patient's history has been reviewed, patient examined, no change in status, stable for surgery.  I have reviewed the patient's chart and labs.  Questions were answered to the patient's satisfaction.     Rennis Chris

## 2023-03-22 NOTE — Transfer of Care (Signed)
Immediate Anesthesia Transfer of Care Note  Patient: Stephanie Armstrong  Procedure(s) Performed: VITRECTOMY 25 GAUGE (Right) SCLERAL BUCKLE (Right: Eye) INSERTION OF GAS (Right: Eye)  Patient Location: PACU  Anesthesia Type:General  Level of Consciousness: awake and alert   Airway & Oxygen Therapy: Patient Spontanous Breathing and Patient connected to nasal cannula oxygen  Post-op Assessment: Report given to RN and Post -op Vital signs reviewed and stable  Post vital signs: Reviewed and stable  Last Vitals:  Vitals Value Taken Time  BP 124/74 03/22/23 1930  Temp    Pulse 79 03/22/23 1931  Resp 20 03/22/23 1930  SpO2 96 % 03/22/23 1931  Vitals shown include unfiled device data.  Last Pain:  Vitals:   03/22/23 1221  TempSrc:   PainSc: 0-No pain         Complications: No notable events documented.

## 2023-03-22 NOTE — Brief Op Note (Signed)
03/22/2023  7:21 PM  PATIENT:  Stephanie Armstrong  67 y.o. female  PRE-OPERATIVE DIAGNOSIS:  retinal detachment, right eye  POST-OPERATIVE DIAGNOSIS:  retinal detachment, right eye  PROCEDURE:  Procedure(s): VITRECTOMY 25 GAUGE (Right) SCLERAL BUCKLE (Right) INSERTION OF GAS (Right)  SURGEON:  Surgeons and Role:    Rennis Chris, MD - Primary  ASSISTANTS: Laurian Brim, Ophthalmic Assistant    ANESTHESIA:   local and general  EBL:  10 mL   BLOOD ADMINISTERED:none  DRAINS: none   LOCAL MEDICATIONS USED:  MARCAINE   , LIDOCAINE , and Amount: 10 ml  SPECIMEN:  No Specimen  DISPOSITION OF SPECIMEN:  N/A  COUNTS:  YES  TOURNIQUET:  * No tourniquets in log *  DICTATION: .Note written in EPIC  PLAN OF CARE: Discharge to home after PACU  PATIENT DISPOSITION:  PACU - hemodynamically stable.   Delay start of Pharmacological VTE agent (>24hrs) due to surgical blood loss or risk of bleeding: not applicable

## 2023-03-22 NOTE — Anesthesia Postprocedure Evaluation (Signed)
Anesthesia Post Note  Patient: JERLISA ZANARDI  Procedure(s) Performed: VITRECTOMY 25 GAUGE (Right) SCLERAL BUCKLE (Right: Eye) INSERTION OF GAS (Right: Eye)     Patient location during evaluation: PACU Anesthesia Type: General Level of consciousness: awake and alert Pain management: pain level controlled Vital Signs Assessment: post-procedure vital signs reviewed and stable Respiratory status: spontaneous breathing, nonlabored ventilation, respiratory function stable and patient connected to nasal cannula oxygen Cardiovascular status: blood pressure returned to baseline and stable Postop Assessment: no apparent nausea or vomiting Anesthetic complications: no   No notable events documented.  Last Vitals:  Vitals:   03/22/23 1945 03/22/23 2000  BP: 126/68 115/64  Pulse: 69 62  Resp: 11 12  Temp:  (!) 36.1 C  SpO2: 98% 98%    Last Pain:  Vitals:   03/22/23 2000  TempSrc:   PainSc: 0-No pain                 Earl Lites P Mervil Wacker

## 2023-03-22 NOTE — Anesthesia Preprocedure Evaluation (Addendum)
Anesthesia Evaluation  Patient identified by MRN, date of birth, ID band Patient awake    Reviewed: Allergy & Precautions, NPO status , Patient's Chart, lab work & pertinent test results  History of Anesthesia Complications Negative for: history of anesthetic complications  Airway Mallampati: II  TM Distance: >3 FB Neck ROM: Full    Dental  (+) Dental Advisory Given   Pulmonary neg pulmonary ROS   Pulmonary exam normal        Cardiovascular hypertension, Pt. on medications Normal cardiovascular exam     Neuro/Psych  PSYCHIATRIC DISORDERS Anxiety     negative neurological ROS     GI/Hepatic Neg liver ROS,GERD  Medicated and Controlled,,  Endo/Other  negative endocrine ROS    Renal/GU negative Renal ROS  Female GU complaint     Musculoskeletal  (+) Arthritis ,    Abdominal   Peds  Hematology negative hematology ROS (+)   Anesthesia Other Findings On Glp-1a, last dose > 1 wk ago   Reproductive/Obstetrics                             Anesthesia Physical Anesthesia Plan  ASA: 2  Anesthesia Plan: General   Post-op Pain Management: Tylenol PO (pre-op)*   Induction: Intravenous  PONV Risk Score and Plan: 3 and Treatment may vary due to age or medical condition, Ondansetron and Dexamethasone  Airway Management Planned: Oral ETT  Additional Equipment: None  Intra-op Plan:   Post-operative Plan: Extubation in OR  Informed Consent: I have reviewed the patients History and Physical, chart, labs and discussed the procedure including the risks, benefits and alternatives for the proposed anesthesia with the patient or authorized representative who has indicated his/her understanding and acceptance.     Dental advisory given  Plan Discussed with: CRNA and Anesthesiologist  Anesthesia Plan Comments:        Anesthesia Quick Evaluation

## 2023-03-22 NOTE — Op Note (Signed)
Date of procedure: 12.05.24   Surgeon: Rennis Chris, MD, PhD   Assistant: Laurian Brim, Ophthalmic Assistant    Pre-operative Diagnosis: Macula involving Rhegmatogenous Retinal Detachment, Right Eye   Post-operative diagnosis: Macula involving Rhegmatogenous Retinal Detachment, Right Eye   Anesthesia: GETA   Procedures: 1)     Scleral Buckle, Right Eye 2)     25 gauge pars plana vitrectomy, Right Eye CPT [phone] 3)     Perfluorocarbon injection 4)     Fluid-air exchange, Right Eye 5)     Endolaser, Right Eye 5)     Injection of 14% C3F8 gas   Complications: none Estimated blood loss: minimal Specimens: none   Brief history:   The patient has a history of decreased vision in the affected right eye, and on examination, was noted to have a macula-involving retinal detachment, affecting activities of daily living. The risks, benefits, and alternatives were explained to the patient, including pain, bleeding, infection, loss of vision, double vision, droopy eyelids, and need for more surgeries.  Informed consent was obtained from the patient and placed in the chart.     Procedure:             The patient was brought to the preoperative holding area where the correct eye was confirmed and marked.  The patient was then brought to the operating room where general endotracheal anesthesia was induced. A secondary time-out was performed to identify the correct patient, eye, procedures, and any allergies. The right eye was prepped and draped in the usual sterile ophthalmic fashion followed by placement of a lid speculum.             A 360 conjunctival peritomy was created using Westcott scissors and 0.12 forceps. Each of the four quadrants between the rectus muscles was dissected using Stevens scissors to detach Tenon's attachments from the globe. Each of the four rectus muscles was isolated on a muscle hook and slung using 2-0 Silk suture in the usual standard fashion. Each of the four quadrants  between the rectus muscles was inspected and there were noted to be no areas of scleral thinning. A #41 silicone band was then brought onto the field and was threaded under each rectus muscle. The band was then loosely secured using a #70 Watzke sleeve in the inferotemporal quadrant. The band was then sutured to the sclera in each quadrant using 5-0 nylon sutures passed partial thickness through the sclera in a horizontal mattress fashion. The scleral buckle was then tightened to the appropriate height with two locking needle drivers. Attention was then turned to the vitrectomy portion of the procedure.              A 25 gauge trocar was placed in the inferotemporal quadrant in a beveled fashion. A 4 mm infusion cannula was placed through this trocar, and the infusion cannula was confirmed in the vitreous cavity with no incarceration of retina or choroid prior to turning it on. Two additional 25 gauge trocars were placed in the superonasal and superotemporal quadrants (2 and 10 oclock, respectively) in a similar beveled fashion. At this time, a standard three-port pars plana vitrectomy was performed using the light pipe, the cutter, and the BIOM viewing system. A thorough anterior, core and peripheral vitreous dissection was performed. A posterior vitreous detachment was confirmed over the optic nerve. There was a bullous temporal retinal detachment extending from 0600 to 1200 w/ macular involvement. The fovea was detached and there were 2 peripheral retinal breaks within  the detached retina spanning 01-1029 oclock.             Traction was removed from all retinal breaks. The breaks were trimmed using the cutter to smooth the edges. Perfluoron was injected to push the subretinal fluid anterior to the scleral buckle.  A complete fluid-air exchange was performed with a soft tip extrusion cannula over the most posterior retinal break, then posteriorly to remove the perfluoron. After completion of these maneuvers,  the retina was flat over the macula and over the scleral buckle. Under air, endolaser was applied to all the breaks and over and posterior to the scleral buckle.             At this time, the buckle height was confirmed and the buckle was finalized by trimming the band ends. The superonasal trocar was removed and sutured with 7-0 vicryl in an interrupted fashion.  A complete air to 14% C3F8 gas exchange was performed through the infusion cannula and vented through the superotemporal trocar using the extrusion cannula. The superotemporal trocar and infusion cannula and associated trocar were then removed and sutured with 7-0 vicryl in an interrupted fashion. Kefzol + polymixin irrigation was then used over the buckle. A subtenon's block containing 0.75% marcaine and 2% lidocaine was administered.              The conjunctiva was closed with 7-0 vicryl sutures. The eye's intraocular pressure was confirmed to be at a physiologic level by digital palpation. Subconjunctival injections of Antibiotic and kenalog were administered. The lid speculum and drapes were removed. Drops of an antibiotic, antihypertensives, and steroid were given. Copious antibiotic ointment was instilled into the eye. The eye was patched and shielded. The patient tolerated the procedure well without any intraoperative or immediate postoperative complications. The patient was taken to the recovery room in good condition. The patient was instructed to maintain a strict face-down position and will be seen by Dr. Vanessa Barbara tomorrow morning in clinic.

## 2023-03-22 NOTE — Discharge Instructions (Signed)
POSTOPERATIVE INSTRUCTIONS  Your doctor has performed vitreoretinal surgery on you at [facility].  - Keep eye patched and shielded until seen by Dr. Coralee Edberg 8 AM tomorrow in clinic - Do not use drops until return - FACE DOWN POSITIONING WHILE AWAKE - Sleep with belly down or on left side, avoid laying flat on back.    - No strenuous bending, stooping or lifting.  - You may not drive until further notice.  - If your doctor used a gas bubble in your eye during the procedure he will advise you on postoperative positioning. If you have a gas bubble you will be wearing a green bracelet that was applied in the operating room. The green bracelet should stay on as long as the gas bubble is in your eye. While the gas bubble is present you should not fly in an airplane. If you require general anesthesia while the gas bubble is present you must notify your anesthesiologist that an intraocular gas bubble is present so he can take the appropriate precautions.  - Tylenol or any other over-the-counter pain reliever can be used according to your doctor. If more pain medicine is required, your doctor will have a prescription for you.  - You may read, go up and down stairs, and watch television.     Irie Dowson, M.D., Ph.D.  

## 2023-03-23 ENCOUNTER — Ambulatory Visit (INDEPENDENT_AMBULATORY_CARE_PROVIDER_SITE_OTHER): Payer: PPO | Admitting: Ophthalmology

## 2023-03-23 ENCOUNTER — Encounter (HOSPITAL_COMMUNITY): Payer: Self-pay | Admitting: Ophthalmology

## 2023-03-23 DIAGNOSIS — H21233 Degeneration of iris (pigmentary), bilateral: Secondary | ICD-10-CM

## 2023-03-23 DIAGNOSIS — H3321 Serous retinal detachment, right eye: Secondary | ICD-10-CM

## 2023-03-23 DIAGNOSIS — I10 Essential (primary) hypertension: Secondary | ICD-10-CM

## 2023-03-23 DIAGNOSIS — H35371 Puckering of macula, right eye: Secondary | ICD-10-CM

## 2023-03-23 DIAGNOSIS — H25813 Combined forms of age-related cataract, bilateral: Secondary | ICD-10-CM

## 2023-03-23 DIAGNOSIS — H35033 Hypertensive retinopathy, bilateral: Secondary | ICD-10-CM

## 2023-03-23 NOTE — Progress Notes (Signed)
[facility] - Clinic Note  03/23/2023   CHIEF COMPLAINT Patient presents for Retina Follow Up  HISTORY OF PRESENT ILLNESS: Stephanie Armstrong is a 67 y.o. female who presents to the clinic today for:  HPI     Retina Follow Up   Patient presents with  Retinal Break/Detachment.  In right eye.  This started 1 day ago.  Duration of 1 day.  Since onset it is stable.  I, the attending physician,  performed the HPI with the patient and updated documentation appropriately.        Comments   Retina follow up ret detachment OD pt is reporting some discomfort last night she was unable to get her pain pill pharm was closed she is having a large floaters denies any flashes       Last edited by Rennis Chris, MD on 03/23/2023  8:14 AM.    Pt states she was not able to rest very much last night, she is trying to figure out comfortable ways to keep her face down and sleep, she states the pain was not too bad last night  Referring physician: Elfredia Nevins, MD [address] [place],  [place] [address]  HISTORICAL INFORMATION:  Selected notes from the MEDICAL RECORD NUMBER Referred by Dr. 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   prednisoLONE acetate (PRED FORTE) 1 % ophthalmic suspension Place 1 drop into both eyes 4 (four) times daily.   No current facility-administered medications for this visit. (Ophthalmic Drugs)   Current Outpatient Medications (Other)  Medication Sig   ALPRAZolam (XANAX) 0.5 MG tablet Take 1 tab p.o. qhs for 2 weeks, then take 1/2 tab p.o. qhs, then take 1/2 tab qod, then discontinue. (Patient taking differently: Take 0.25 mg by mouth 2 (two) times daily as needed for anxiety.)   amLODipine-benazepril (LOTREL) 5-40 MG capsule TAKE (1) CAPSULE  BY MOUTH ONCE DAILY.   ascorbic acid (VITAMIN C) 500 MG tablet Take 500 mg by mouth 3 (three) times a week.   Cholecalciferol (VITAMIN D3) 125 MCG (5000 UT) CAPS Take 5,000 Units by mouth daily.   diphenoxylate-atropine (LOMOTIL) 2.5-0.025 MG tablet TAKE 2 TABLETS BY MOUTH 4 TIMES DAILY AS NEEDED FOR DIARRHEA OR LOOSE STOOLS. MAX 8 TABLETS IN 24 HOURS.   estradiol (ESTRACE VAGINAL) 0.1 MG/GM vaginal cream Place 1/2 gram vaginally at hs two to three times per week as needed.   fluorouracil (EFUDEX) 5 % cream Apply 1 Application topically daily as needed (skin cancer issue).   hydrochlorothiazide (HYDRODIURIL) 25 MG tablet TAKE ONE TABLET BY MOUTH ONCE DAILY AS NEEDED. DO NOT EXCEED 3 PER WEEK. APPT REQUIRED FOR FUTURE REFILLS   HYDROcodone-acetaminophen (NORCO/VICODIN) 5-325 MG tablet Take 1 tablet by mouth every 4 (four) hours as needed for moderate pain (pain score 4-6).   HYDROcodone-acetaminophen (NORCO/VICODIN) 5-325 MG tablet Take 1 tablet by mouth every 4 (four) hours as needed for moderate pain (pain score 4-6).   ibuprofen (ADVIL) 800 MG tablet Take 1 tablet (800 mg total) by mouth every 8 (eight) hours as needed.   pantoprazole (PROTONIX) 40 MG tablet Take 40 mg by mouth daily as needed (acid reflux).   Plecanatide (TRULANCE) 3 MG TABS Take 1 tablet (3 mg total) by mouth daily. (Patient taking differently: Take 3  mg by mouth daily as needed (constipation).)   Probiotic, Lactobacillus, CAPS Take 1 capsule by mouth daily.   Semaglutide,0.25 or 0.5MG /DOS, (OZEMPIC, 0.25 OR 0.5 MG/DOSE,) 2 MG/1.5ML SOPN Inject 0.5 mg into the skin once a week.   tretinoin (RETIN-A) 0.05 % cream Apply 1 Application topically 3 (three) times a week.   zolpidem (AMBIEN) 10 MG tablet Take 10 mg by mouth at bedtime.   No current facility-administered medications for this visit. (Other)   REVIEW OF SYSTEMS: ROS   Positive for: Constitutional, Gastrointestinal, Eyes Negative for: Neurological, Skin,  Genitourinary, Musculoskeletal, HENT, Endocrine, Cardiovascular, Respiratory, Psychiatric, Allergic/Imm, Heme/Lymph Last edited by Etheleen Mayhew, COT on 03/23/2023  7:53 AM.     ALLERGIES Allergies  Allergen Reactions   Amoxicillin-Pot Clavulanate Diarrhea   Codeine Nausea Only   PAST MEDICAL HISTORY Past Medical History:  Diagnosis Date   Allergy    Anxiety    Arthritis    fingers   ASCUS with positive high risk human papillomavirus of vagina dx 10/11/15   Cancer (HCC)    basal cell skin   Hepatitis    age 76 , unknown type   History of vulvar dysplasia    VIN 2   Hypertension    IBS (irritable bowel syndrome)    Osteopenia 2018   Rectocele    Urethral pain    mass   Urinary incontinence    VAIN II (vaginal intraepithelial neoplasia grade II) dx 08/2015   right and left labia minora. This is really VIN II and not VAIN II.    Wears contact lenses    Past Surgical History:  Procedure Laterality Date   ABDOMINAL SACROCOLPOPEXY N/A 03/28/2016   Procedure: ABDOMINO SACROCOLPOPEXY;  Surgeon: Patton Salles, MD;  Location: [facility];  Service: Gynecology;  Laterality: N/A;   ANAL RECTAL MANOMETRY N/A 08/09/2022   Procedure: ANO RECTAL MANOMETRY;  Surgeon: Hilarie Fredrickson, MD;  Location: [facility];  Service: Gastroenterology;  Laterality: N/A;   ANTERIOR AND POSTERIOR REPAIR N/A 03/28/2016   Procedure: ANTERIOR (CYSTOCELE) AND POSTERIOR REPAIR (RECTOCELE);  Surgeon: Patton Salles, MD;  Location: [facility];  Service: Gynecology;  Laterality: N/A;   AUGMENTATION MAMMAPLASTY  APRIL 2013   BREAST IMPLANT CHANGED   BILATERAL SALPINGECTOMY Bilateral 03/28/2016   Procedure: BILATERAL SALPINGECTOMY;  Surgeon: Patton Salles, MD;  Location: [facility];  Service: Gynecology;  Laterality: Bilateral;   BLADDER SUSPENSION N/A 03/28/2016   Procedure: TRANSVAGINAL TAPE (TVT) PROCEDURE exact midurethral sling;  Surgeon: Patton Salles, MD;  Location: [facility];  Service: Gynecology;  Laterality: N/A;   COLONOSCOPY     CYSTOSCOPY N/A 03/28/2016   Procedure: CYSTOSCOPY;  Surgeon: Patton Salles, MD;  Location: [facility];  Service: Gynecology;  Laterality: N/A;   CYSTOSCOPY WITH BIOPSY Bilateral 11/03/2015   Procedure: CYSTOSCOPY WITH URETHRAL BIOPSY, FULGERATION, BILATERAL RETROGRADE PYELOGRAMS;  Surgeon: Sebastian Ache, MD;  Location[facility];  Service: Urology;  Laterality: Bilateral;   GAS INSERTION Right 03/22/2023   Procedure: INSERTION OF GAS;  Surgeon: Rennis Chris, MD;  Location: [facility] OR;  Service: Ophthalmology;  Laterality: Right;   LYSIS OF ADHESION N/A 03/28/2016   Procedure: LYSIS OF ADHESION;  Surgeon: Patton Salles, MD;  Location: [facility];  Service: Gynecology;  Laterality: N/A;   MASS EXCISION N/A 11/03/2015   Procedure: EXCISION  URETHRAL MASS;  Surgeon: Sebastian Ache, MD;  Location: [facility];  Service: Urology;  Laterality: N/A;   SCLERAL BUCKLE Right 03/22/2023   Procedure: SCLERAL BUCKLE;  Surgeon: Rennis Chris, MD;  Location: [facility] OR;  Service: Ophthalmology;  Laterality: Right;   TUBAL LIGATION     VAGINAL HYSTERECTOMY  1999   VITRECTOMY 25 GAUGE WITH SCLERAL BUCKLE Right 03/22/2023   Procedure: VITRECTOMY 25 GAUGE;  Surgeon: Rennis Chris, MD;  Location: [facility] OR;  Service: Ophthalmology;  Laterality: Right;   FAMILY HISTORY Family History  Problem Relation Age of Onset   Hypertension Mother    Osteoporosis Mother    Paget's disease of bone Mother    Heart failure Mother    Cancer Father        MELANOMA   Diabetes Father    Hypertension Father    Heart disease Father    Cancer Sister        THYROID   Breast cancer Paternal Grandmother    Colon cancer Neg Hx    Pancreatic cancer Neg Hx    Esophageal cancer Neg Hx    SOCIAL HISTORY Social History   Tobacco Use   Smoking status: Never   Smokeless tobacco: Never  Vaping Use   Vaping status: Never Used  Substance  Use Topics   Alcohol use: Not Currently   Drug use: No       OPHTHALMIC EXAM:  Base Eye Exam     Visual Acuity (Snellen - Linear)       Right Left   Dist [place] 20/40   Dist ph [place] NI NI         Tonometry (Tonopen, 7:58 AM)       Right Left   Pressure 15 10         Pupils       Pupils Dark Light Shape React APD   Right PERRL dilated  Round     Left PERRL 3 2 Round Brisk None         Visual Fields       Left Right    Full Full         Extraocular Movement       Right Left    Full, Ortho Full, Ortho         Neuro/Psych     Oriented x3: Yes   Mood/Affect: Normal         Dilation     Right eye: 2.5% Phenylephrine @ 7:58 AM           Slit Lamp and Fundus Exam     Slit Lamp Exam       Right Left   Lids/Lashes Dermatochalasis - upper lid, Meibomian gland dysfunction Dermatochalasis - upper lid, mild MGD   Conjunctiva/Sclera Subconjunctival hemorrhage, sutures intact White and quiet   Cornea arcus; central epi defect 1+ Punctate epithelial erosions   Anterior Chamber moderate depth, early fibrin reaction centrally, 3+ Cell and pigment deep, 3-4+ fine pigment   Iris Round and dilated Round and dilated   Lens 2-3+ Nuclear sclerosis, 2-3+ Cortical cataract, focal central PSC, fibrin reaction overlying, 2+PC feathering 2-3+ Nuclear sclerosis, 2-3+ Cortical cataract   Anterior Vitreous post vitrectomy, good gas fill syneresis, fine pigment, Posterior vitreous detachment, vitreous condensations         Fundus Exam       Right Left   Disc Pink and Sharp    C/D Ratio 0.2 0.1   Macula flat under gas    Vessels attenuated, Tortuous  Periphery Hazy view, attached over buckle, good buckle height, good laser over buckle and around tears; PRE:OP, Bullous temporal detachment from 0800-1030 with bi-lobed HST at 1030            IMAGING AND PROCEDURES  Imaging and Procedures for 03/23/2023         ASSESSMENT/PLAN:   ICD-10-CM   1. Right  retinal detachment  H33.21     2. Epiretinal membrane (ERM) of right eye  H35.371     3. Essential hypertension  I10     4. Hypertensive retinopathy of both eyes  H35.033     5. Combined forms of age-related cataract of both eyes  H25.813     6. Pigment dispersion syndrome of both eyes  H21.233      Rhegmatogenous retinal detachment, OD - bullous temporal mac off detachment -- pt reports center involvement maybe 1-2 wks prior - detached from 0800 to 1030, fovea off, bi-lobed HST at 1030 - POD1 s/p PPV/PFO/EL/FAX/14% C3F8 OD, 12.05.2024             - doing well this morning             - retina attached and in good position -- good buckle height and laser around breaks             - IOP okay at 15             - start   PF q2h OD                          zymaxid QID OD                          Atropine BID OD                          Brimonidine BID OD                         PSO ung QID OD              - cont face down positioning x7 days; avoid laying flat on back              - eye shield when sleeping              - post op drop and positioning instructions reviewed              - tylenol/ibuprofen for pain              - Rx given for breakthrough pain  - f/u next Thursday - POV, DFE  2. Epiretinal membrane, right eye  - original exam showed dense ERM w/ central thickening and preretinal fibrosis  3,4. Hypertensive retinopathy OU - discussed importance of tight BP control - monitor  5. Mixed Cataract OU - The symptoms of cataract, surgical options, and treatments and risks were discussed with patient. - discussed diagnosis and progression - has an appt with Dr. Vonna Kotyk in February  Ophthalmic Meds Ordered this visit:  No orders of the defined types were placed in this encounter.    Return in about 6 days (around 03/29/2023) for f/u RD OD, DFE, POV.  There are no Patient Instructions on file for this visit.  Explained the diagnoses, plan, and follow up with the  patient and they expressed  understanding.  Patient expressed understanding of the importance of proper follow up care.   This document serves as a record of services personally performed by Karie Chimera, MD, PhD. It was created on their behalf by Glee Arvin. Manson Passey, OA an ophthalmic technician. The creation of this record is the provider's dictation and/or activities during the visit.    Electronically signed by: Glee Arvin. Manson Passey, OA 03/24/23 10:39 AM   Karie Chimera, M.D., Ph.D. Diseases & Surgery of the Retina and Vitreous [facility] 03/23/2023  I have reviewed the above documentation for accuracy and completeness, and I agree with the above. Karie Chimera, M.D., Ph.D. 03/24/23 10:41 AM   Abbreviations: M myopia (nearsighted); A astigmatism; H hyperopia (farsighted); P presbyopia; Mrx spectacle prescription;  CTL contact lenses; OD right eye; OS left eye; OU both eyes  XT exotropia; ET esotropia; PEK punctate epithelial keratitis; PEE punctate epithelial erosions; DES dry eye syndrome; MGD meibomian gland dysfunction; ATs artificial tears; PFAT's preservative free artificial tears; NSC nuclear sclerotic cataract; PSC posterior subcapsular cataract; ERM epi-retinal membrane; PVD posterior vitreous detachment; RD retinal detachment; DM diabetes mellitus; DR diabetic retinopathy; NPDR non-proliferative diabetic retinopathy; PDR proliferative diabetic retinopathy; CSME clinically significant macular edema; DME diabetic macular edema; dbh dot blot hemorrhages; CWS cotton wool spot; POAG primary open angle glaucoma; C/D cup-to-disc ratio; HVF humphrey visual field; GVF goldmann visual field; OCT optical coherence tomography; IOP intraocular pressure; BRVO Branch retinal vein occlusion; CRVO central retinal vein occlusion; CRAO central retinal artery occlusion; BRAO branch retinal artery occlusion; RT retinal tear; SB scleral buckle; PPV pars plana vitrectomy; VH Vitreous  hemorrhage; PRP panretinal laser photocoagulation; IVK intravitreal kenalog; VMT vitreomacular traction; MH Macular hole;  NVD neovascularization of the disc; NVE neovascularization elsewhere; AREDS age related eye disease study; ARMD age related macular degeneration; POAG primary open angle glaucoma; EBMD epithelial/anterior basement membrane dystrophy; ACIOL anterior chamber intraocular lens; IOL intraocular lens; PCIOL posterior chamber intraocular lens; Phaco/IOL phacoemulsification with intraocular lens placement; PRK photorefractive keratectomy; LASIK laser assisted in situ keratomileusis; HTN hypertension; DM diabetes mellitus; COPD chronic obstructive pulmonary disease

## 2023-03-27 NOTE — Progress Notes (Signed)
[facility] - Clinic Note  03/29/2023   CHIEF COMPLAINT Patient presents for Retina Follow Up  HISTORY OF PRESENT ILLNESS: Stephanie Armstrong is a 67 y.o. female who presents to the clinic today for:  HPI     Retina Follow Up   Patient presents with  Other.  In right eye.  This started 6 days ago.  I, the attending physician,  performed the HPI with the patient and updated documentation appropriately.        Comments   Patient here for 6 days retina follow up for s/p RD OD. Patient states sees bursts of light. Sees zigzag like on cartoon conversation bubble. No eye pain. It is itchy. Using all drops and ung.       Last edited by Rennis Chris, MD on 03/30/2023  1:55 AM.    Patient states the positioning is rough.   Referring physician: Elfredia Nevins, MD [address] [place],  [place] [address]  HISTORICAL INFORMATION:  Selected notes from the MEDICAL RECORD NUMBER Referred by Dr. Charise Killian for concern of macular hole OD LEE:  Ocular Hx- PMH-   CURRENT MEDICATIONS: Current Outpatient Medications (Ophthalmic Drugs)  Medication Sig   bacitracin-polymyxin b (POLYSPORIN) ophthalmic ointment Place into the right eye 4 (four) times daily for 10 days. Place a 1/2 inch ribbon of ointment into the lower eyelid.   Bromfenac Sodium (PROLENSA) 0.07 % SOLN Place 1 drop into both eyes 4 (four) times daily.   prednisoLONE acetate (PRED FORTE) 1 % ophthalmic suspension Place 1 drop into both eyes 4 (four) times daily.   No current facility-administered medications for this visit. (Ophthalmic Drugs)   Current Outpatient Medications (Other)  Medication Sig   ALPRAZolam (XANAX) 0.5 MG tablet Take 1 tab p.o. qhs for 2 weeks, then take 1/2 tab p.o. qhs, then take 1/2 tab qod, then discontinue. (Patient taking differently: Take 0.25 mg by mouth 2 (two) times daily as needed for anxiety.)   amLODipine-benazepril (LOTREL) 5-40 MG capsule TAKE (1) CAPSULE BY MOUTH ONCE  DAILY.   ascorbic acid (VITAMIN C) 500 MG tablet Take 500 mg by mouth 3 (three) times a week.   Cholecalciferol (VITAMIN D3) 125 MCG (5000 UT) CAPS Take 5,000 Units by mouth daily.   diphenoxylate-atropine (LOMOTIL) 2.5-0.025 MG tablet TAKE 2 TABLETS BY MOUTH 4 TIMES DAILY AS NEEDED FOR DIARRHEA OR LOOSE STOOLS. MAX 8 TABLETS IN 24 HOURS.   estradiol (ESTRACE VAGINAL) 0.1 MG/GM vaginal cream Place 1/2 gram vaginally at hs two to three times per week as needed.   fluorouracil (EFUDEX) 5 % cream Apply 1 Application topically daily as needed (skin cancer issue).   hydrochlorothiazide (HYDRODIURIL) 25 MG tablet TAKE ONE TABLET BY MOUTH ONCE DAILY AS NEEDED. DO NOT EXCEED 3 PER WEEK. APPT REQUIRED FOR FUTURE REFILLS   HYDROcodone-acetaminophen (NORCO/VICODIN) 5-325 MG tablet Take 1 tablet by mouth every 4 (four) hours as needed for moderate pain (pain score 4-6).   HYDROcodone-acetaminophen (NORCO/VICODIN) 5-325 MG tablet Take 1 tablet by mouth every 4 (four) hours as needed for moderate pain (pain score 4-6).   ibuprofen (ADVIL) 800 MG tablet Take 1 tablet (800 mg total) by mouth every 8 (eight) hours as needed.   pantoprazole (PROTONIX) 40 MG tablet Take 40 mg by mouth daily as needed (acid reflux).   Plecanatide (TRULANCE) 3 MG TABS Take 1 tablet (3 mg total) by mouth daily. (Patient taking differently: Take 3 mg by mouth daily as needed (constipation).)  Probiotic, Lactobacillus, CAPS Take 1 capsule by mouth daily.   Semaglutide,0.25 or 0.5MG /DOS, (OZEMPIC, 0.25 OR 0.5 MG/DOSE,) 2 MG/1.5ML SOPN Inject 0.5 mg into the skin once a week.   tretinoin (RETIN-A) 0.05 % cream Apply 1 Application topically 3 (three) times a week.   zolpidem (AMBIEN) 10 MG tablet Take 10 mg by mouth at bedtime.   No current facility-administered medications for this visit. (Other)   REVIEW OF SYSTEMS: ROS   Positive for: Constitutional, Gastrointestinal, Eyes Negative for: Neurological, Skin, Genitourinary,  Musculoskeletal, HENT, Endocrine, Cardiovascular, Respiratory, Psychiatric, Allergic/Imm, Heme/Lymph Last edited by Laddie Aquas, COA on 03/29/2023  9:22 AM.      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 This is really VIN II and not VAIN II.    Wears contact lenses    Past Surgical History:  Procedure Laterality Date   ABDOMINAL SACROCOLPOPEXY N/A 03/28/2016   Procedure: ABDOMINO SACROCOLPOPEXY;  Surgeon: Patton Salles, MD;  Location: [facility];  Service: Gynecology;  Laterality: N/A;   ANAL RECTAL MANOMETRY N/A 08/09/2022   Procedure: ANO RECTAL MANOMETRY;  Surgeon: Hilarie Fredrickson, MD;  Location: [facility];  Service: Gastroenterology;  Laterality: N/A;   ANTERIOR AND POSTERIOR REPAIR N/A 03/28/2016   Procedure: ANTERIOR (CYSTOCELE) AND POSTERIOR REPAIR (RECTOCELE);  Surgeon: Patton Salles, MD;  Location: [facility];  Service: Gynecology;  Laterality: N/A;   AUGMENTATION MAMMAPLASTY  APRIL 2013   BREAST IMPLANT CHANGED   BILATERAL SALPINGECTOMY Bilateral 03/28/2016   Procedure: BILATERAL SALPINGECTOMY;  Surgeon: Patton Salles, MD;  Location: [facility];  Service: Gynecology;  Laterality: Bilateral;   BLADDER SUSPENSION N/A 03/28/2016   Procedure: TRANSVAGINAL TAPE (TVT) PROCEDURE exact midurethral sling;  Surgeon: Patton Salles, MD;  Location: [facility];  Service:  Gynecology;  Laterality: N/A;   COLONOSCOPY     CYSTOSCOPY N/A 03/28/2016   Procedure: CYSTOSCOPY;  Surgeon: Patton Salles, MD;  Location: [facility];  Service: Gynecology;  Laterality: N/A;   CYSTOSCOPY WITH BIOPSY Bilateral 11/03/2015   Procedure: CYSTOSCOPY WITH URETHRAL BIOPSY, FULGERATION, BILATERAL RETROGRADE PYELOGRAMS;  Surgeon: Sebastian Ache, MD;  Location: [facility];  Service: Urology;  Laterality: Bilateral;   GAS INSERTION Right 03/22/2023   Procedure: INSERTION OF GAS;  Surgeon: Rennis Chris, MD;  Location: [facility] OR;  Service: Ophthalmology;  Laterality: Right;   LYSIS OF ADHESION N/A 03/28/2016   Procedure: LYSIS OF ADHESION;  Surgeon: Patton Salles, MD;  Location: [facility];  Service: Gynecology;  Laterality: N/A;   MASS EXCISION N/A 11/03/2015   Procedure: EXCISION  URETHRAL MASS;  Surgeon: Sebastian Ache, MD;  Location: [facility];  Service: Urology;  Laterality: N/A;  SCLERAL BUCKLE Right 03/22/2023   Procedure: SCLERAL BUCKLE;  Surgeon: Rennis Chris, MD;  Location: [facility] OR;  Service: Ophthalmology;  Laterality: Right;   TUBAL LIGATION     VAGINAL HYSTERECTOMY  1999   VITRECTOMY 25 GAUGE WITH SCLERAL BUCKLE Right 03/22/2023   Procedure: VITRECTOMY 25 GAUGE;  Surgeon: Rennis Chris, MD;  Location: [facility] OR;  Service: Ophthalmology;  Laterality: Right;   FAMILY HISTORY Family History  Problem Relation Age of Onset   Hypertension Mother    Osteoporosis Mother    Paget's disease of bone Mother    Heart failure Mother    Cancer Father        MELANOMA   Diabetes Father    Hypertension Father    Heart disease Father    Cancer Sister        THYROID   Breast cancer Paternal Grandmother    Colon cancer Neg Hx    Pancreatic cancer Neg Hx    Esophageal cancer Neg Hx    SOCIAL HISTORY Social History   Tobacco Use   Smoking status: Never   Smokeless tobacco: Never  Vaping Use   Vaping status: Never Used  Substance Use Topics    Alcohol use: Not Currently   Drug use: No       OPHTHALMIC EXAM:  Base Eye Exam     Visual Acuity (Snellen - Linear)       Right Left   Dist [place] CF at 6"    Dist cc  20/20 -2    Correction: Contacts         Tonometry (Tonopen, 9:18 AM)       Right Left   Pressure 15 13         Pupils       Dark Light Shape React APD   Right 5 5 Round  None   Left 4 3 Round Brisk None         Visual Fields (Counting fingers)       Left Right    Full Full         Extraocular Movement       Right Left    Full, Ortho Full, Ortho         Neuro/Psych     Oriented x3: Yes   Mood/Affect: Normal         Dilation     Both eyes: 1.0% Mydriacyl, 2.5% Phenylephrine @ 9:17 AM           Slit Lamp and Fundus Exam     Slit Lamp Exam       Right Left   Lids/Lashes Dermatochalasis - upper lid, Meibomian gland dysfunction Dermatochalasis - upper lid, mild MGD   Conjunctiva/Sclera sutures intact, Subconjunctival hemorrhage- improving White and quiet   Cornea arcus; central epi defect - closed, 3+ Punctate epithelial erosions, +endo pigment and endo heme 1+ Punctate epithelial erosions   Anterior Chamber moderate depth, .5+ Cell and pigment deep, 3-4+ fine pigment   Iris Round and dilated Round and dilated   Lens 2-3+ Nuclear sclerosis, 2-3+ Cortical cataract, focal central PSC, 2+PC feathering/PSC 2-3+ Nuclear sclerosis, 2-3+ Cortical cataract   Anterior Vitreous post vitrectomy, good gas fill syneresis, fine pigment, Posterior vitreous detachment, vitreous condensations         Fundus Exam       Right Left   Disc Pink and Sharp    C/D Ratio 0.2 0.1   Macula Hazy view, Flat under gas    Vessels  attenuated, Tortuous    Periphery Hazy view, attached over buckle, good buckle height, good laser over buckle and around tears; PRE:OP, Bullous temporal detachment from 0800-1030 with bi-lobed HST at 1030            IMAGING AND PROCEDURES  Imaging and Procedures  for 03/29/2023         ASSESSMENT/PLAN:   ICD-10-CM   1. Right retinal detachment  H33.21     2. Epiretinal membrane (ERM) of right eye  H35.371     3. Essential hypertension  I10     4. Hypertensive retinopathy of both eyes  H35.033     5. 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 -- pt reports center involvement maybe 1-2 wks prior - detached from 0800 to 1030, fovea off, bi-lobed HST at 1030 - POW1 s/p PPV/PFO/EL/FAX/14% C3F8 OD, 12.05.2024             - did well through the week             - retina attached and in good position -- good buckle height and laser around breaks             - IOP  15             - cont PF QID OD  zymaxid QID OD - stop Saturday  Atropine BID OD - stop now Brimonidine QD OD PSO ung QID OD              - cont face down positioning, minimum 30 min/hr; avoid laying flat on back              - eye shield when sleeping x1 more wk             - post op drop and positioning instructions reviewed              - tylenol/ibuprofen for pain              - Rx given for breakthrough pain  - f/u 2-3 wks- POV, DFE  2. Epiretinal membrane, right eye  - original exam showed dense ERM with central thickening and pre retinal fibrosis  3,4. Hypertensive retinopathy OU - discussed importance of tight BP control - monitor  5. Mixed Cataract OU - The symptoms of cataract, surgical options, and treatments and risks were discussed with patient. - discussed diagnosis and progression - has an appointment with Dr. Vonna Kotyk in February  Ophthalmic Meds Ordered this visit:  Meds ordered this encounter  Medications   prednisoLONE acetate (PRED FORTE) 1 % ophthalmic suspension    Sig: Place 1 drop into both eyes 4 (four) times daily.    Dispense:  15 mL    Refill:  2   bacitracin-polymyxin b (POLYSPORIN) ophthalmic ointment    Sig: Place into the right eye 4 (four)  times daily for 10 days. Place a 1/2 inch ribbon of ointment into the lower eyelid.    Dispense:  3.5 g    Refill:  0     Return for f/u 2-3 wks - RD OD -- POV/ DFE.  There are no Patient Instructions on file for this visit.  Explained the diagnoses, plan, and follow up with the patient and they expressed understanding.  Patient expressed understanding of the importance of proper follow up care.   This document serves as a  record of services personally performed by Karie Chimera, MD, PhD. It was created on their behalf by Annalee Genta, COMT. The creation of this record is the provider's dictation and/or activities during the visit.  Electronically signed by: Annalee Genta, COMT 03/30/23 1:57 AM  This document serves as a record of services personally performed by Karie Chimera, MD, PhD. It was created on their behalf by Charlette Caffey, COT an ophthalmic technician. The creation of this record is the provider's dictation and/or activities during the visit.    Electronically signed by:  Charlette Caffey, COT  03/30/23 1:57 AM  Karie Chimera, M.D., Ph.D. Diseases & Surgery of the Retina and Vitreous [facility] 03/29/2023  I have reviewed the above documentation for accuracy and completeness, and I agree with the above. Karie Chimera, M.D., Ph.D. 03/30/23 2:01 AM  Abbreviations: M myopia (nearsighted); A astigmatism; H hyperopia (farsighted); P presbyopia; Mrx spectacle prescription;  CTL contact lenses; OD right eye; OS left eye; OU both eyes  XT exotropia; ET esotropia; PEK punctate epithelial keratitis; PEE punctate epithelial erosions; DES dry eye syndrome; MGD meibomian gland dysfunction; ATs artificial tears; PFAT's preservative free artificial tears; NSC nuclear sclerotic cataract; PSC posterior subcapsular cataract; ERM epi-retinal membrane; PVD posterior vitreous detachment; RD retinal detachment; DM diabetes mellitus; DR diabetic retinopathy;  NPDR non-proliferative diabetic retinopathy; PDR proliferative diabetic retinopathy; CSME clinically significant macular edema; DME diabetic macular edema; dbh dot blot hemorrhages; CWS cotton wool spot; POAG primary open angle glaucoma; C/D cup-to-disc ratio; HVF humphrey visual field; GVF goldmann visual field; OCT optical coherence tomography; IOP intraocular pressure; BRVO Branch retinal vein occlusion; CRVO central retinal vein occlusion; CRAO central retinal artery occlusion; BRAO branch retinal artery occlusion; RT retinal tear; SB scleral buckle; PPV pars plana vitrectomy; VH Vitreous hemorrhage; PRP panretinal laser photocoagulation; IVK intravitreal kenalog; VMT vitreomacular traction; MH Macular hole;  NVD neovascularization of the disc; NVE neovascularization elsewhere; AREDS age related eye disease study; ARMD age related macular degeneration; POAG primary open angle glaucoma; EBMD epithelial/anterior basement membrane dystrophy; ACIOL anterior chamber intraocular lens; IOL intraocular lens; PCIOL posterior chamber intraocular lens; Phaco/IOL phacoemulsification with intraocular lens placement; PRK photorefractive keratectomy; LASIK laser assisted in situ keratomileusis; HTN hypertension; DM diabetes mellitus; COPD chronic obstructive pulmonary disease

## 2023-03-29 ENCOUNTER — Encounter (INDEPENDENT_AMBULATORY_CARE_PROVIDER_SITE_OTHER): Payer: Self-pay | Admitting: Ophthalmology

## 2023-03-29 ENCOUNTER — Ambulatory Visit (INDEPENDENT_AMBULATORY_CARE_PROVIDER_SITE_OTHER): Payer: PPO | Admitting: Ophthalmology

## 2023-03-29 DIAGNOSIS — H3321 Serous retinal detachment, right eye: Secondary | ICD-10-CM

## 2023-03-29 DIAGNOSIS — H25813 Combined forms of age-related cataract, bilateral: Secondary | ICD-10-CM

## 2023-03-29 DIAGNOSIS — I10 Essential (primary) hypertension: Secondary | ICD-10-CM

## 2023-03-29 DIAGNOSIS — H35371 Puckering of macula, right eye: Secondary | ICD-10-CM

## 2023-03-29 DIAGNOSIS — H35033 Hypertensive retinopathy, bilateral: Secondary | ICD-10-CM

## 2023-03-29 MED ORDER — BACITRACIN-POLYMYXIN B 500-10000 UNIT/GM OP OINT: TOPICAL_OINTMENT | Freq: Four times a day (QID) | OPHTHALMIC | 0 refills | Status: AC

## 2023-03-29 MED ORDER — PREDNISOLONE ACETATE 1 % OP SUSP: 1.0000 [drp] | Freq: Four times a day (QID) | OPHTHALMIC | 2 refills | Status: DC

## 2023-03-30 ENCOUNTER — Encounter (INDEPENDENT_AMBULATORY_CARE_PROVIDER_SITE_OTHER): Payer: Self-pay | Admitting: Ophthalmology

## 2023-04-05 NOTE — Progress Notes (Signed)
 [facility] - Clinic Note  04/17/2023   CHIEF COMPLAINT Patient presents for Retina Follow Up  HISTORY OF PRESENT ILLNESS: Stephanie Armstrong is a 67 y.o. female who presents to the clinic today for:  HPI     Retina Follow Up   Patient presents with  Other.  In left eye.  This started 3 weeks ago.  I, the attending physician,  performed the HPI with the patient and updated documentation appropriately.        Comments   Patient here for 3 weeks retina follow up for s/p RD OD. Patient states vision can see in top part of vision. Not clear. Still is wavy. Still has funny feeling. The bubble is about half way down or a little bit lower. No eye pain just constant funny feeling in it. Still using drops.      Last edited by Valdemar Rogue, MD on 04/18/2023  2:17 AM.    Patient states the vision is improving and the bubble is getting smaller.   Referring physician: Bertell Satterfield, MD [address] [place],  [place] [address]  HISTORICAL INFORMATION:  Selected notes from the MEDICAL RECORD NUMBER Referred by Dr. Darroll for concern of macular hole OD LEE:  Ocular Hx- PMH-   CURRENT MEDICATIONS: Current Outpatient Medications (Ophthalmic Drugs)  Medication Sig   Bromfenac  Sodium (PROLENSA ) 0.07 % SOLN Place 1 drop into both eyes 4 (four) times daily.   prednisoLONE  acetate (PRED FORTE ) 1 % ophthalmic suspension Place 1 drop into both eyes 4 (four) times daily.   No current facility-administered medications for this visit. (Ophthalmic Drugs)   Current Outpatient Medications (Other)  Medication Sig   ALPRAZolam  (XANAX ) 0.5 MG tablet Take 1 tab p.o. qhs for 2 weeks, then take 1/2 tab p.o. qhs, then take 1/2 tab qod, then discontinue. (Patient taking differently: Take 0.25 mg by mouth 2 (two) times daily as needed for anxiety.)   amLODipine-benazepril (LOTREL) 5-40 MG capsule TAKE (1) CAPSULE BY MOUTH ONCE DAILY.   ascorbic acid (VITAMIN C) 500 MG tablet Take  500 mg by mouth 3 (three) times a week.   Cholecalciferol (VITAMIN D3) 125 MCG (5000 UT) CAPS Take 5,000 Units by mouth daily.   diphenoxylate-atropine  (LOMOTIL) 2.5-0.025 MG tablet TAKE 2 TABLETS BY MOUTH 4 TIMES DAILY AS NEEDED FOR DIARRHEA OR LOOSE STOOLS. MAX 8 TABLETS IN 24 HOURS.   estradiol  (ESTRACE  VAGINAL) 0.1 MG/GM vaginal cream Place 1/2 gram vaginally at hs two to three times per week as needed.   fluorouracil (EFUDEX) 5 % cream Apply 1 Application topically daily as needed (skin cancer issue).   hydrochlorothiazide (HYDRODIURIL) 25 MG tablet TAKE ONE TABLET BY MOUTH ONCE DAILY AS NEEDED. DO NOT EXCEED 3 PER WEEK. APPT REQUIRED FOR FUTURE REFILLS   HYDROcodone -acetaminophen  (NORCO/VICODIN) 5-325 MG tablet Take 1 tablet by mouth every 4 (four) hours as needed for moderate pain (pain score 4-6).   HYDROcodone -acetaminophen  (NORCO/VICODIN) 5-325 MG tablet Take 1 tablet by mouth every 4 (four) hours as needed for moderate pain (pain score 4-6).   ibuprofen  (ADVIL ) 800 MG tablet Take 1 tablet (800 mg total) by mouth every 8 (eight) hours as needed.   pantoprazole (PROTONIX) 40 MG tablet Take 40 mg by mouth daily as needed (acid reflux).   Plecanatide  (TRULANCE ) 3 MG TABS Take 1 tablet (3 mg total) by mouth daily. (Patient taking differently: Take 3 mg by mouth daily as needed (constipation).)   Probiotic, Lactobacillus, CAPS Take 1 capsule  by mouth daily.   Semaglutide,0.25 or 0.5MG /DOS, (OZEMPIC, 0.25 OR 0.5 MG/DOSE,) 2 MG/1.5ML SOPN Inject 0.5 mg into the skin once a week.   tretinoin (RETIN-A) 0.05 % cream Apply 1 Application topically 3 (three) times a week.   zolpidem (AMBIEN) 10 MG tablet Take 10 mg by mouth at bedtime.   No current facility-administered medications for this visit. (Other)   REVIEW OF SYSTEMS: ROS   Positive for: Constitutional, Gastrointestinal, Eyes Negative for: Neurological, Skin, Genitourinary, Musculoskeletal, HENT, Endocrine, Cardiovascular, Respiratory,  Psychiatric, Allergic/Imm, Heme/Lymph Last edited by Orval Asberry RAMAN, COA on 04/17/2023  8:17 AM.     ALLERGIES Allergies  Allergen Reactions   Amoxicillin-Pot Clavulanate Diarrhea   Codeine Nausea Only   PAST MEDICAL HISTORY Past Medical History:  Diagnosis Date   Allergy    Anxiety    Arthritis    fingers   ASCUS with positive high risk human papillomavirus of vagina dx 10/11/15   Cancer (HCC)    basal cell skin   Hepatitis    age 89 , unknown type   History of vulvar dysplasia    VIN 2   Hypertension    IBS (irritable bowel syndrome)    Osteopenia 2018   Rectocele    Urethral pain    mass   Urinary incontinence    VAIN II (vaginal intraepithelial neoplasia grade II) dx 08/2015   right and left labia minora. This is really VIN II and not VAIN II.    Wears contact lenses    Past Surgical History:  Procedure Laterality Date   ABDOMINAL SACROCOLPOPEXY N/A 03/28/2016   Procedure: ABDOMINO SACROCOLPOPEXY;  Surgeon: Bobie FORBES Cathlyn JAYSON Nikki, MD;  Location: [facility];  Service: Gynecology;  Laterality: N/A;   ANAL RECTAL MANOMETRY N/A 08/09/2022   Procedure: ANO RECTAL MANOMETRY;  Surgeon: Abran Norleen SAILOR, MD;  Location: [facility];  Service: Gastroenterology;  Laterality: N/A;   ANTERIOR AND POSTERIOR REPAIR N/A 03/28/2016   Procedure: ANTERIOR (CYSTOCELE) AND POSTERIOR REPAIR (RECTOCELE);  Surgeon: Bobie FORBES Cathlyn JAYSON Nikki, MD;  Location: [facility];  Service: Gynecology;  Laterality: N/A;   AUGMENTATION MAMMAPLASTY  APRIL 2013   BREAST IMPLANT CHANGED   BILATERAL SALPINGECTOMY Bilateral 03/28/2016   Procedure: BILATERAL SALPINGECTOMY;  Surgeon: Bobie FORBES Cathlyn JAYSON Nikki, MD;  Location: [facility];  Service: Gynecology;  Laterality: Bilateral;   BLADDER SUSPENSION N/A 03/28/2016   Procedure: TRANSVAGINAL TAPE (TVT) PROCEDURE exact midurethral sling;  Surgeon: Bobie FORBES Cathlyn JAYSON Nikki, MD;  Location: [facility];  Service: Gynecology;  Laterality: N/A;   COLONOSCOPY     CYSTOSCOPY N/A  03/28/2016   Procedure: CYSTOSCOPY;  Surgeon: Bobie FORBES Cathlyn JAYSON Nikki, MD;  Location: [facility];  Service: Gynecology;  Laterality: N/A;   CYSTOSCOPY WITH BIOPSY Bilateral 11/03/2015   Procedure: CYSTOSCOPY WITH URETHRAL BIOPSY, FULGERATION, BILATERAL RETROGRADE PYELOGRAMS;  Surgeon: Ricardo Likens, MD;  Location: [facility];  Service: Urology;  Laterality: Bilateral;   GAS INSERTION Right 03/22/2023   Procedure: INSERTION OF GAS;  Surgeon: Valdemar Rogue, MD;  Location: [facility] OR;  Service: Ophthalmology;  Laterality: Right;   LYSIS OF ADHESION N/A 03/28/2016   Procedure: LYSIS OF ADHESION;  Surgeon: Bobie FORBES Cathlyn JAYSON Nikki, MD;  Location: [facility];  Service: Gynecology;  Laterality: N/A;   MASS EXCISION N/A 11/03/2015   Procedure: EXCISION  URETHRAL MASS;  Surgeon: Ricardo Likens, MD;  Location: [facility];  Service: Urology;  Laterality: N/A;   SCLERAL BUCKLE Right 03/22/2023  Procedure: SCLERAL BUCKLE;  Surgeon: Valdemar Rogue, MD;  Location: [facility] OR;  Service: Ophthalmology;  Laterality: Right;   TUBAL LIGATION     VAGINAL HYSTERECTOMY  1999   VITRECTOMY 25 GAUGE WITH SCLERAL BUCKLE Right 03/22/2023   Procedure: VITRECTOMY 25 GAUGE;  Surgeon: Valdemar Rogue, MD;  Location: [facility] OR;  Service: Ophthalmology;  Laterality: Right;   FAMILY HISTORY Family History  Problem Relation Age of Onset   Hypertension Mother    Osteoporosis Mother    Paget's disease of bone Mother    Heart failure Mother    Cancer Father        MELANOMA   Diabetes Father    Hypertension Father    Heart disease Father    Cancer Sister        THYROID    Breast cancer Paternal Grandmother    Colon cancer Neg Hx    Pancreatic cancer Neg Hx    Esophageal cancer Neg Hx    SOCIAL HISTORY Social History   Tobacco Use   Smoking status: Never   Smokeless tobacco: Never  Vaping Use   Vaping status: Never Used  Substance Use Topics   Alcohol use: Not Currently   Drug use: No       OPHTHALMIC  EXAM:  Base Eye Exam     Visual Acuity (Snellen - Linear)       Right Left   Dist [place] 20/200 -2    Dist cc  20/30 +2   Dist ph cc  20/25 -2    Correction: Contacts         Tonometry (Tonopen, 8:11 AM)       Right Left   Pressure 12 14         Pupils       Dark Light Shape React APD   Right 5 5 Round NR None   Left 3 2 Round Brisk None         Visual Fields (Counting fingers)       Left Right    Full Full         Extraocular Movement       Right Left    Full, Ortho Full, Ortho         Neuro/Psych     Oriented x3: Yes   Mood/Affect: Normal         Dilation     Both eyes: 1.0% Mydriacyl , 2.5% Phenylephrine  @ 8:11 AM           Slit Lamp and Fundus Exam     Slit Lamp Exam       Right Left   Lids/Lashes Dermatochalasis - upper lid, Meibomian gland dysfunction Dermatochalasis - upper lid, mild MGD   Conjunctiva/Sclera sutures disolving, Subconjunctival hemorrhage- improved White and quiet   Cornea arcus; central epi defect - closed, 2+ Punctate epithelial erosions 1+ Punctate epithelial erosions   Anterior Chamber Deep and clear deep, 3-4+ fine pigment   Iris Round and dilated Round and dilated   Lens 2-3+ Nuclear sclerosis, 2-3+ Cortical cataract, focal central PSC, 2+PC feathering/PSC 2-3+ Nuclear sclerosis, 2-3+ Cortical cataract   Anterior Vitreous post vitrectomy, gas bubble ~45% syneresis, fine pigment, Posterior vitreous detachment, vitreous condensations         Fundus Exam       Right Left   Disc Pink and Sharp    C/D Ratio 0.2 0.1   Macula Flat under gas    Vessels attenuated, Tortuous    Periphery Attached over buckle,  good buckle height, good laser over buckle and around tears; PRE:OP, Bullous temporal detachment from 0800-1030 with bi-lobed HST at 1030            IMAGING AND PROCEDURES  Imaging and Procedures for 04/17/2023  OCT, Retina - OU - Both Eyes       Right Eye Quality was good. Central Foveal  Thickness: 244. Progression has improved. Findings include no IRF, no SRF, abnormal foveal contour, intraretinal hyper-reflective material, epiretinal membrane, outer retinal atrophy, preretinal fibrosis (Retina reattached, diffused retinal atrophy, ERM).   Left Eye Quality was good. Central Foveal Thickness: 267. Progression has been stable. Findings include normal foveal contour, no IRF, no SRF (Partial PVD).   Notes *Images captured and stored on drive  Diagnosis / Impression:  OD: Retina reattached, diffused retinal atrophy, ERM OS: NFP; no IRF/SRF   Clinical management:  See below  Abbreviations: NFP - Normal foveal profile. CME - cystoid macular edema. PED - pigment epithelial detachment. IRF - intraretinal fluid. SRF - subretinal fluid. EZ - ellipsoid zone. ERM - epiretinal membrane. ORA - outer retinal atrophy. ORT - outer retinal tubulation. SRHM - subretinal hyper-reflective material. IRHM - intraretinal hyper-reflective material           ASSESSMENT/PLAN:   ICD-10-CM   1. Right retinal detachment  H33.21 OCT, Retina - OU - Both Eyes    2. Epiretinal membrane (ERM) of right eye  H35.371     3. Essential hypertension  I10     4. Hypertensive retinopathy of both eyes  H35.033     5. Combined forms of age-related cataract of both eyes  H25.813       Rhegmatogenous retinal detachment, OD - bullous temporal mac off detachment -- pt reports center involvement maybe 1-2 wks prior - detached from 0800 to 1030, fovea off, bi-lobed HST at 1030 - POW3+ s/p SB + PPV/PFO/EL/FAX/14% C3F8 OD, 12.05.2024             - retina attached and in good position -- good buckle height and laser around breaks             - IOP  12  - Oct shows OD Retina reattached, diffused retinal atrophy, ERM  - gas bubble ~45%             - dec PF to TID OD    PSO ung to BID OD  - stop Brimonidine  - cont face down positioning, minimum 30 min/hr for 2 more weeks; avoid laying flat on back  - avoid  working and driving until the bubble is gone             - eye shield when sleeping - finished             - post op drop and positioning instructions reviewed              - tylenol /ibuprofen  for pain              - Rx given for breakthrough pain  - f/u 3-4 wks- POV, DFE  2. Epiretinal membrane, right eye  - original exam showed dense ERM with central thickening and pre retinal fibrosis  3,4. Hypertensive retinopathy OU - discussed importance of tight BP control - monitor  5. Mixed Cataract OU - The symptoms of cataract, surgical options, and treatments and risks were discussed with patient. - discussed diagnosis and progression - has an appointment with Dr. Lavonia in February  Ophthalmic Meds Ordered this  visit:  No orders of the defined types were placed in this encounter.    Return in about 4 weeks (around 05/15/2023) for f/u RD OD , DFE, OCT.  There are no Patient Instructions on file for this visit.  Explained the diagnoses, plan, and follow up with the patient and they expressed understanding.  Patient expressed understanding of the importance of proper follow up care.   This document serves as a record of services personally performed by Redell JUDITHANN Hans, MD, PhD. It was created on their behalf by Wanda GEANNIE Keens, COT an ophthalmic technician. The creation of this record is the provider's dictation and/or activities during the visit.    Electronically signed by:  Wanda GEANNIE Keens, COT  04/18/23 2:19 AM  Redell JUDITHANN Hans, M.D., Ph.D. Diseases & Surgery of the Retina and Vitreous [facility] 04/17/2023  I have reviewed the above documentation for accuracy and completeness, and I agree with the above. Redell JUDITHANN Hans, M.D., Ph.D. 04/18/23 2:21 AM   Abbreviations: M myopia (nearsighted); A astigmatism; H hyperopia (farsighted); P presbyopia; Mrx spectacle prescription;  CTL contact lenses; OD right eye; OS left eye; OU both eyes  XT exotropia; ET  esotropia; PEK punctate epithelial keratitis; PEE punctate epithelial erosions; DES dry eye syndrome; MGD meibomian gland dysfunction; ATs artificial tears; PFAT's preservative free artificial tears; NSC nuclear sclerotic cataract; PSC posterior subcapsular cataract; ERM epi-retinal membrane; PVD posterior vitreous detachment; RD retinal detachment; DM diabetes mellitus; DR diabetic retinopathy; NPDR non-proliferative diabetic retinopathy; PDR proliferative diabetic retinopathy; CSME clinically significant macular edema; DME diabetic macular edema; dbh dot blot hemorrhages; CWS cotton wool spot; POAG primary open angle glaucoma; C/D cup-to-disc ratio; HVF humphrey visual field; GVF goldmann visual field; OCT optical coherence tomography; IOP intraocular pressure; BRVO Branch retinal vein occlusion; CRVO central retinal vein occlusion; CRAO central retinal artery occlusion; BRAO branch retinal artery occlusion; RT retinal tear; SB scleral buckle; PPV pars plana vitrectomy; VH Vitreous hemorrhage; PRP panretinal laser photocoagulation; IVK intravitreal kenalog ; VMT vitreomacular traction; MH Macular hole;  NVD neovascularization of the disc; NVE neovascularization elsewhere; AREDS age related eye disease study; ARMD age related macular degeneration; POAG primary open angle glaucoma; EBMD epithelial/anterior basement membrane dystrophy; ACIOL anterior chamber intraocular lens; IOL intraocular lens; PCIOL posterior chamber intraocular lens; Phaco/IOL phacoemulsification with intraocular lens placement; PRK photorefractive keratectomy; LASIK laser assisted in situ keratomileusis; HTN hypertension; DM diabetes mellitus; COPD chronic obstructive pulmonary disease

## 2023-04-17 ENCOUNTER — Encounter (INDEPENDENT_AMBULATORY_CARE_PROVIDER_SITE_OTHER): Payer: Self-pay | Admitting: Ophthalmology

## 2023-04-17 ENCOUNTER — Ambulatory Visit (INDEPENDENT_AMBULATORY_CARE_PROVIDER_SITE_OTHER): Payer: PPO | Admitting: Ophthalmology

## 2023-04-17 DIAGNOSIS — H3321 Serous retinal detachment, right eye: Secondary | ICD-10-CM | POA: Diagnosis not present

## 2023-04-17 DIAGNOSIS — H35371 Puckering of macula, right eye: Secondary | ICD-10-CM

## 2023-04-17 DIAGNOSIS — H35033 Hypertensive retinopathy, bilateral: Secondary | ICD-10-CM | POA: Diagnosis not present

## 2023-04-17 DIAGNOSIS — I10 Essential (primary) hypertension: Secondary | ICD-10-CM | POA: Diagnosis not present

## 2023-04-17 DIAGNOSIS — H25813 Combined forms of age-related cataract, bilateral: Secondary | ICD-10-CM

## 2023-04-18 ENCOUNTER — Encounter (INDEPENDENT_AMBULATORY_CARE_PROVIDER_SITE_OTHER): Payer: Self-pay | Admitting: Ophthalmology

## 2023-04-24 NOTE — Progress Notes (Signed)
[facility] - Clinic Note  05/08/2023   CHIEF COMPLAINT Patient presents for Retina Follow Up  HISTORY OF PRESENT ILLNESS: Stephanie Armstrong is a 68 y.o. female who presents to the clinic today for:  HPI     Retina Follow Up   Patient presents with  Retinal Break/Detachment.  In right eye.  Severity is moderate.  Duration of 3 weeks.  Since onset it is stable.  I, the attending physician,  performed the HPI with the patient and updated documentation appropriately.        Comments   Patient states bubble getting smaller OD. Using Pred Forte tid OD. Stopped ointment due to itching OD.      Last edited by Rennis Chris, MD on 05/08/2023 10:01 PM.    Patient states she talked to Dr. Vonna Kotyk' office about cataract sx, she has an appt with them on February 11th, she states her gas bubble is small and she can see through it, she is using PF TID OD, she stopped using PSO Ung bc it was making her eye itch  Referring physician: Elfredia Nevins, MD [address] [place],  [place] [address]  HISTORICAL INFORMATION:  Selected notes from the MEDICAL RECORD NUMBER Referred by Dr. Charise Killian for concern of macular hole OD LEE:  Ocular Hx- PMH-   CURRENT MEDICATIONS: Current Outpatient Medications (Ophthalmic Drugs)  Medication Sig   prednisoLONE acetate (PRED FORTE) 1 % ophthalmic suspension Place 1 drop into both eyes 4 (four) times daily.   Bromfenac Sodium (PROLENSA) 0.07 % SOLN Place 1 drop into both eyes 4 (four) times daily. (Patient not taking: Reported on 05/08/2023)   No current facility-administered medications for this visit. (Ophthalmic Drugs)   Current Outpatient Medications (Other)  Medication Sig   ALPRAZolam (XANAX) 0.5 MG tablet Take 1 tab p.o. qhs for 2 weeks, then take 1/2 tab p.o. qhs, then take 1/2 tab qod, then discontinue. (Patient taking differently: Take 0.25 mg by mouth 2 (two) times daily as needed for anxiety.)   amLODipine-benazepril  (LOTREL) 5-40 MG capsule TAKE (1) CAPSULE BY MOUTH ONCE DAILY.   ascorbic acid (VITAMIN C) 500 MG tablet Take 500 mg by mouth 3 (three) times a week.   Cholecalciferol (VITAMIN D3) 125 MCG (5000 UT) CAPS Take 5,000 Units by mouth daily.   diphenoxylate-atropine (LOMOTIL) 2.5-0.025 MG tablet TAKE 2 TABLETS BY MOUTH 4 TIMES DAILY AS NEEDED FOR DIARRHEA OR LOOSE STOOLS. MAX 8 TABLETS IN 24 HOURS.   estradiol (ESTRACE VAGINAL) 0.1 MG/GM vaginal cream Place 1/2 gram vaginally at hs two to three times per week as needed.   fluorouracil (EFUDEX) 5 % cream Apply 1 Application topically daily as needed (skin cancer issue).   hydrochlorothiazide (HYDRODIURIL) 25 MG tablet TAKE ONE TABLET BY MOUTH ONCE DAILY AS NEEDED. DO NOT EXCEED 3 PER WEEK. APPT REQUIRED FOR FUTURE REFILLS   HYDROcodone-acetaminophen (NORCO/VICODIN) 5-325 MG tablet Take 1 tablet by mouth every 4 (four) hours as needed for moderate pain (pain score 4-6).   HYDROcodone-acetaminophen (NORCO/VICODIN) 5-325 MG tablet Take 1 tablet by mouth every 4 (four) hours as needed for moderate pain (pain score 4-6).   ibuprofen (ADVIL) 800 MG tablet Take 1 tablet (800 mg total) by mouth every 8 (eight) hours as needed.   pantoprazole (PROTONIX) 40 MG tablet Take 40 mg by mouth daily as needed (acid reflux).   Plecanatide (TRULANCE) 3 MG TABS Take 1 tablet (3 mg total) by mouth daily. (Patient taking differently: Take 3  mg by mouth daily as needed (constipation).)   Probiotic, Lactobacillus, CAPS Take 1 capsule by mouth daily.   Semaglutide,0.25 or 0.5MG /DOS, (OZEMPIC, 0.25 OR 0.5 MG/DOSE,) 2 MG/1.5ML SOPN Inject 0.5 mg into the skin once a week.   tretinoin (RETIN-A) 0.05 % cream Apply 1 Application topically 3 (three) times a week.   zolpidem (AMBIEN) 10 MG tablet Take 10 mg by mouth at bedtime.   No current facility-administered medications for this visit. (Other)   REVIEW OF SYSTEMS: ROS   Positive for: Constitutional, Gastrointestinal,  Eyes Negative for: Neurological, Skin, Genitourinary, Musculoskeletal, HENT, Endocrine, Cardiovascular, Respiratory, Psychiatric, Allergic/Imm, Heme/Lymph Last edited by Annalee Genta D, COT on 05/08/2023 12:47 PM.     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 This is really VIN II and not VAIN II.    Wears contact lenses    Past Surgical History:  Procedure Laterality Date   ABDOMINAL SACROCOLPOPEXY N/A 03/28/2016   Procedure: ABDOMINO SACROCOLPOPEXY;  Surgeon: Patton Salles, MD;  Location: [facility];  Service: Gynecology;  Laterality: N/A;   ANAL RECTAL MANOMETRY N/A 08/09/2022   Procedure: ANO RECTAL MANOMETRY;  Surgeon: Hilarie Fredrickson, MD;  Location: [facility];  Service: Gastroenterology;  Laterality: N/A;   ANTERIOR AND POSTERIOR REPAIR N/A 03/28/2016   Procedure: ANTERIOR (CYSTOCELE) AND POSTERIOR REPAIR (RECTOCELE);  Surgeon: Patton Salles, MD;  Location: [facility];  Service: Gynecology;  Laterality: N/A;   AUGMENTATION MAMMAPLASTY  APRIL 2013   BREAST IMPLANT CHANGED   BILATERAL SALPINGECTOMY Bilateral 03/28/2016   Procedure: BILATERAL SALPINGECTOMY;  Surgeon: Patton Salles, MD;  Location: [facility];  Service: Gynecology;  Laterality: Bilateral;   BLADDER SUSPENSION N/A 03/28/2016   Procedure: TRANSVAGINAL TAPE (TVT) PROCEDURE exact midurethral sling;  Surgeon: Patton Salles, MD;  Location: [facility];  Service: Gynecology;  Laterality: N/A;   COLONOSCOPY     CYSTOSCOPY N/A 03/28/2016   Procedure: CYSTOSCOPY;  Surgeon: Patton Salles, MD;  Location: [facility];  Service: Gynecology;  Laterality: N/A;   CYSTOSCOPY WITH BIOPSY Bilateral 11/03/2015   Procedure: CYSTOSCOPY WITH URETHRAL BIOPSY, FULGERATION, BILATERAL RETROGRADE PYELOGRAMS;  Surgeon: Sebastian Ache, MD;  Location: [facility];  Service: Urology;  Laterality: Bilateral;   GAS INSERTION Right 03/22/2023   Procedure: INSERTION OF GAS;  Surgeon: Rennis Chris, MD;  Location: [facility] OR;  Service: Ophthalmology;  Laterality: Right;   LYSIS OF ADHESION N/A 03/28/2016   Procedure: LYSIS OF ADHESION;  Surgeon: Patton Salles, MD;  Location: [facility];  Service: Gynecology;  Laterality: N/A;   MASS EXCISION N/A 11/03/2015   Procedure: EXCISION  URETHRAL MASS;  Surgeon: Sebastian Ache, MD;  [facility];  Service: Urology;  Laterality: N/A;   SCLERAL BUCKLE Right 03/22/2023   Procedure: SCLERAL BUCKLE;  Surgeon: Rennis Chris, MD;  Location: [facility] OR;  Service: Ophthalmology;  Laterality: Right;   TUBAL LIGATION     VAGINAL HYSTERECTOMY  1999   VITRECTOMY 25 GAUGE WITH SCLERAL BUCKLE Right 03/22/2023   Procedure: VITRECTOMY 25 GAUGE;  Surgeon: Rennis Chris, MD;  Location: [facility];  Service: Ophthalmology;  Laterality: Right;   FAMILY HISTORY Family History  Problem Relation Age of Onset   Hypertension Mother    Osteoporosis Mother    Paget's disease of bone Mother    Heart failure Mother    Cancer Father        MELANOMA   Diabetes Father    Hypertension Father    Heart disease Father    Cancer Sister        THYROID   Breast cancer Paternal Grandmother    Colon cancer Neg Hx    Pancreatic cancer Neg Hx    Esophageal cancer Neg Hx    SOCIAL HISTORY Social History   Tobacco Use   Smoking status: Never   Smokeless tobacco: Never  Vaping Use    Vaping status: Never Used  Substance Use Topics   Alcohol use: Not Currently   Drug use: No       OPHTHALMIC EXAM:  Base Eye Exam     Visual Acuity (Snellen - Linear)       Right Left   Dist [place] 20/800    Dist cc  20/25 -2   Dist ph [place] NI    Dist ph cc  20/25         Tonometry (Tonopen, 12:57 PM)       Right Left   Pressure 13 13         Pupils       Dark Light Shape React APD   Right 5 4.5 Round Minimal None   Left 3 2 Round Brisk None         Visual Fields (Counting fingers)       Left Right    Full Full         Extraocular Movement       Right Left    Full, Ortho Full, Ortho         Neuro/Psych     Oriented x3: Yes   Mood/Affect: Normal           Slit Lamp and Fundus Exam     Slit Lamp Exam       Right Left   Lids/Lashes Dermatochalasis - upper lid, Meibomian gland dysfunction Dermatochalasis - upper lid, mild MGD   Conjunctiva/Sclera sutures disolving, Subconjunctival hemorrhage- improved White and quiet   Cornea arcus; 1+ Punctate epithelial erosions 1+ Punctate epithelial erosions   Anterior Chamber Deep and clear deep, 3-4+ fine pigment   Iris Round and dilated Round and dilated   Lens 2-3+ Nuclear sclerosis, 2-3+ Cortical cataract, 2+PSC 2-3+ Nuclear sclerosis, 2-3+ Cortical cataract   Anterior Vitreous post vitrectomy, gas bubble ~25-30% syneresis, fine pigment, Posterior vitreous detachment, vitreous condensations         Fundus Exam       Right Left   Disc Pink and Sharp, Compact Pink and Sharp, Compact   C/D Ratio 0.2 0.1   Macula Flat, Blunted foveal reflex, ERM with central retinal thickening and fibrosis Flat, Blunted foveal reflex, RPE mottling and clumping, No heme or edema   Vessels attenuated, mild  tortuosity attenuated, Tortuous   Periphery Attached over buckle, good buckle height, good laser over buckle and around tears; PRE:OP, Bullous temporal detachment from 0800-1030 with bi-lobed HST at 1030 Attached, No  heme           IMAGING AND PROCEDURES  Imaging and Procedures for 05/08/2023  OCT, Retina - OU - Both Eyes       Right Eye Quality was good. Central Foveal Thickness: 788. Findings include no IRF, no SRF, abnormal foveal contour, intraretinal hyper-reflective material, epiretinal membrane, outer retinal atrophy, preretinal fibrosis (Retina reattached, diffuse retinal atrophy, ERM with progression of central retinal thickening).   Left Eye Quality was good. Central Foveal Thickness: 270. Progression has been stable. Findings include normal foveal contour, no IRF, no SRF (Partial PVD).   Notes *Images captured and stored on drive  Diagnosis / Impression:  OD: Retina reattached, diffuse retinal atrophy, ERM with progression of central retinal thickening OS: NFP; no IRF/SRF   Clinical management:  See below  Abbreviations: NFP - Normal foveal profile. CME - cystoid macular edema. PED - pigment epithelial detachment. IRF - intraretinal fluid. SRF - subretinal fluid. EZ - ellipsoid zone. ERM - epiretinal membrane. ORA - outer retinal atrophy. ORT - outer retinal tubulation. SRHM - subretinal hyper-reflective material. IRHM - intraretinal hyper-reflective material            ASSESSMENT/PLAN:   ICD-10-CM   1. Right retinal detachment  H33.21 OCT, Retina - OU - Both Eyes    2. Epiretinal membrane (ERM) of right eye  H35.371     3. Essential hypertension  I10     4. Hypertensive retinopathy of both eyes  H35.033     5. 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 -- pt reports center involvement maybe 1-2 wks prior - detached from 0800 to 1030, fovea off, bi-lobed HST at 1030 - POW6+ s/p SB + PPV/PFO/EL/FAX/14% C3F8 OD, 12.05.2024             - retina attached and in good position -- good buckle height and laser around breaks             - IOP  13  - Oct shows OD Retina  reattached, diffused retinal atrophy, +ERM  - gas bubble ~25-30%             - dec PF to BID OD from TID   PSO ung BID OD -- pt stopped due to itching -- okay to use prn  - stop Brimonidine - cont face down positioning, minimum 30 min/hr for 2 more weeks; avoid laying flat on back  - avoid working and driving until the bubble is gone             - eye shield when sleeping - finished             - post op drop and positioning instructions reviewed              - tylenol/ibuprofen for pain              - Rx given for breakthrough pain  - f/u 4 wks, POV, DFE  2. Epiretinal membrane, right eye  - original exam showed dense ERM with central thickening and pre retinal fibrosis  3,4. Hypertensive retinopathy OU - discussed importance of tight BP control - monitor  5. Mixed Cataract OU - The symptoms  of cataract, surgical options, and treatments and risks were discussed with patient. - discussed diagnosis and progression - has an appointment with Dr. Vonna Kotyk on February 11th - pt will be clear from a retina standpoint to proceed with cataract surgery once gas bubble is gone  Ophthalmic Meds Ordered this visit:  No orders of the defined types were placed in this encounter.    Return in about 4 weeks (around 06/05/2023) for f/u RD OD, DFE, OCT.  There are no Patient Instructions on file for this visit.  Explained the diagnoses, plan, and follow up with the patient and they expressed understanding.  Patient expressed understanding of the importance of proper follow up care.   This document serves as a record of services personally performed by Karie Chimera, MD, PhD. It was created on their behalf by Charlette Caffey, COT an ophthalmic technician. The creation of this record is the provider's dictation and/or activities during the visit.    Electronically signed by:  Charlette Caffey, COT  05/08/23 10:02 PM  This document serves as a record of services personally performed by Karie Chimera, MD, PhD. It was created on their behalf by Glee Arvin. Manson Passey, OA an ophthalmic technician. The creation of this record is the provider's dictation and/or activities during the visit.    Electronically signed by: Glee Arvin. Manson Passey, OA 05/08/23 10:02 PM.  Karie Chimera, M.D., Ph.D. Diseases & Surgery of the Retina and Vitreous [facility] 05/08/2023  I have reviewed the above documentation for accuracy and completeness, and I agree with the above. Karie Chimera, M.D., Ph.D. 05/08/23 10:05 PM   Abbreviations: M myopia (nearsighted); A astigmatism; H hyperopia (farsighted); P presbyopia; Mrx spectacle prescription;  CTL contact lenses; OD right eye; OS left eye; OU both eyes  XT exotropia; ET esotropia; PEK punctate epithelial keratitis; PEE punctate epithelial erosions; DES dry eye syndrome; MGD meibomian gland dysfunction; ATs artificial tears; PFAT's preservative free artificial tears; NSC nuclear sclerotic cataract; PSC posterior subcapsular cataract; ERM epi-retinal membrane; PVD posterior vitreous detachment; RD retinal detachment; DM diabetes mellitus; DR diabetic retinopathy; NPDR non-proliferative diabetic retinopathy; PDR proliferative diabetic retinopathy; CSME clinically significant macular edema; DME diabetic macular edema; dbh dot blot hemorrhages; CWS cotton wool spot; POAG primary open angle glaucoma; C/D cup-to-disc ratio; HVF humphrey visual field; GVF goldmann visual field; OCT optical coherence tomography; IOP intraocular pressure; BRVO Branch retinal vein occlusion; CRVO central retinal vein occlusion; CRAO central retinal artery occlusion; BRAO branch retinal artery occlusion; RT retinal tear; SB scleral buckle; PPV pars plana vitrectomy; VH Vitreous hemorrhage; PRP panretinal laser photocoagulation; IVK intravitreal kenalog; VMT vitreomacular traction; MH Macular hole;  NVD neovascularization of the disc; NVE neovascularization elsewhere; AREDS age  related eye disease study; ARMD age related macular degeneration; POAG primary open angle glaucoma; EBMD epithelial/anterior basement membrane dystrophy; ACIOL anterior chamber intraocular lens; IOL intraocular lens; PCIOL posterior chamber intraocular lens; Phaco/IOL phacoemulsification with intraocular lens placement; PRK photorefractive keratectomy; LASIK laser assisted in situ keratomileusis; HTN hypertension; DM diabetes mellitus; COPD chronic obstructive pulmonary disease

## 2023-05-08 ENCOUNTER — Encounter (INDEPENDENT_AMBULATORY_CARE_PROVIDER_SITE_OTHER): Payer: Self-pay | Admitting: Ophthalmology

## 2023-05-08 ENCOUNTER — Ambulatory Visit (INDEPENDENT_AMBULATORY_CARE_PROVIDER_SITE_OTHER): Payer: PPO | Admitting: Ophthalmology

## 2023-05-08 DIAGNOSIS — H35033 Hypertensive retinopathy, bilateral: Secondary | ICD-10-CM

## 2023-05-08 DIAGNOSIS — H25813 Combined forms of age-related cataract, bilateral: Secondary | ICD-10-CM

## 2023-05-08 DIAGNOSIS — H35371 Puckering of macula, right eye: Secondary | ICD-10-CM

## 2023-05-08 DIAGNOSIS — H3321 Serous retinal detachment, right eye: Secondary | ICD-10-CM | POA: Diagnosis not present

## 2023-05-08 DIAGNOSIS — I10 Essential (primary) hypertension: Secondary | ICD-10-CM

## 2023-05-21 ENCOUNTER — Other Ambulatory Visit: Payer: Self-pay | Admitting: Physician Assistant

## 2023-05-24 NOTE — Progress Notes (Shared)
[facility] - Clinic Note  06/05/2023   CHIEF COMPLAINT Patient presents for Retina Follow Up  HISTORY OF PRESENT ILLNESS: Stephanie Armstrong is a 68 y.o. female who presents to the clinic today for:  HPI     Retina Follow Up   Patient presents with  Retinal Break/Detachment.  In right eye.  Severity is moderate.  Duration of 4 weeks.  Since onset it is stable.  I, the attending physician,  performed the HPI with the patient and updated documentation appropriately.        Comments   Patient states the gas bubble is gone and can see out of the eye but the vision is still jumbled. She is using Pred OD every day.       Last edited by Rennis Chris, MD on 06/05/2023  5:41 PM.    Patient states she had a consultation with Dr. Vonna Kotyk last Tuesday, but had to cx due to weather, she states it got rescheduled to May  Referring physician: Elfredia Nevins, MD [address] [place],  [place] [address]  HISTORICAL INFORMATION:  Selected notes from the MEDICAL RECORD NUMBER Referred by Dr. Charise Killian for concern of macular hole OD LEE:  Ocular Hx- PMH-   CURRENT MEDICATIONS: Current Outpatient Medications (Ophthalmic Drugs)  Medication Sig   prednisoLONE acetate (PRED FORTE) 1 % ophthalmic suspension Place 1 drop into both eyes 4 (four) times daily.   Bromfenac Sodium (PROLENSA) 0.07 % SOLN Place 1 drop into both eyes 4 (four) times daily. (Patient not taking: Reported on 05/08/2023)   No current facility-administered medications for this visit. (Ophthalmic Drugs)   Current Outpatient Medications (Other)  Medication Sig   ALPRAZolam (XANAX) 0.5 MG tablet Take 1 tab p.o. qhs for 2 weeks, then take 1/2 tab p.o. qhs, then take 1/2 tab qod, then discontinue. (Patient taking differently: Take 0.25 mg by mouth 2 (two) times daily as needed for anxiety.)   amLODipine-benazepril (LOTREL) 5-40 MG capsule TAKE (1) CAPSULE BY MOUTH ONCE DAILY.   ascorbic acid (VITAMIN C) 500 MG  tablet Take 500 mg by mouth 3 (three) times a week.   Cholecalciferol (VITAMIN D3) 125 MCG (5000 UT) CAPS Take 5,000 Units by mouth daily.   diphenoxylate-atropine (LOMOTIL) 2.5-0.025 MG tablet TAKE 2 TABLETS BY MOUTH 4 TIMES DAILY AS NEEDED FOR DIARRHEA OR LOOSE STOOLS. MAX 8 TABLETS IN 24 HOURS.   estradiol (ESTRACE VAGINAL) 0.1 MG/GM vaginal cream Place 1/2 gram vaginally at hs two to three times per week as needed.   fluorouracil (EFUDEX) 5 % cream Apply 1 Application topically daily as needed (skin cancer issue).   hydrochlorothiazide (HYDRODIURIL) 25 MG tablet TAKE ONE TABLET BY MOUTH ONCE DAILY AS NEEDED. DO NOT EXCEED 3 PER WEEK. APPT REQUIRED FOR FUTURE REFILLS   HYDROcodone-acetaminophen (NORCO/VICODIN) 5-325 MG tablet Take 1 tablet by mouth every 4 (four) hours as needed for moderate pain (pain score 4-6).   HYDROcodone-acetaminophen (NORCO/VICODIN) 5-325 MG tablet Take 1 tablet by mouth every 4 (four) hours as needed for moderate pain (pain score 4-6).   ibuprofen (ADVIL) 800 MG tablet Take 1 tablet (800 mg total) by mouth every 8 (eight) hours as needed.   pantoprazole (PROTONIX) 40 MG tablet Take 40 mg by mouth daily as needed (acid reflux).   Probiotic, Lactobacillus, CAPS Take 1 capsule by mouth daily.   tretinoin (RETIN-A) 0.05 % cream Apply 1 Application topically 3 (three) times a week.   TRULANCE 3 MG TABS TAKE 1  TABLET BY MOUTH DAILY   zolpidem (AMBIEN) 10 MG tablet Take 10 mg by mouth at bedtime.   Semaglutide,0.25 or 0.5MG /DOS, (OZEMPIC, 0.25 OR 0.5 MG/DOSE,) 2 MG/1.5ML SOPN Inject 0.5 mg into the skin once a week. (Patient not taking: Reported on 06/05/2023)   No current facility-administered medications for this visit. (Other)   REVIEW OF SYSTEMS:   ALLERGIES Allergies  Allergen Reactions   Amoxicillin-Pot Clavulanate Diarrhea   Codeine Nausea Only   PAST MEDICAL HISTORY Past Medical History:  Diagnosis Date   Allergy    Anxiety    Arthritis    fingers    ASCUS with positive high risk human papillomavirus of vagina dx 10/11/15   Cancer (HCC)    basal cell skin   Hepatitis    age 29 , unknown type   History of vulvar dysplasia    VIN 2   Hypertension    IBS (irritable bowel syndrome)    Osteopenia 2018   Rectocele    Urethral pain    mass   Urinary incontinence    VAIN II (vaginal intraepithelial neoplasia grade II) dx 08/2015   right and left labia minora. This is really VIN II and not VAIN II.    Wears contact lenses    Past Surgical History:  Procedure Laterality Date   ABDOMINAL SACROCOLPOPEXY N/A 03/28/2016   Procedure: ABDOMINO SACROCOLPOPEXY;  Surgeon: Patton Salles, MD;  Location: [facility];  Service: Gynecology;  Laterality: N/A;   ANAL RECTAL MANOMETRY N/A 08/09/2022   Procedure: ANO RECTAL MANOMETRY;  Surgeon: Hilarie Fredrickson, MD;  Location: [facility];  Service: Gastroenterology;  Laterality: N/A;   ANTERIOR AND POSTERIOR REPAIR N/A 03/28/2016   Procedure: ANTERIOR (CYSTOCELE) AND POSTERIOR REPAIR (RECTOCELE);  Surgeon: Patton Salles, MD;  Location: [facility];  Service: Gynecology;  Laterality: N/A;   AUGMENTATION MAMMAPLASTY  APRIL 2013   BREAST IMPLANT CHANGED   BILATERAL SALPINGECTOMY Bilateral 03/28/2016   Procedure: BILATERAL SALPINGECTOMY;  Surgeon: Patton Salles, MD;  Location: [facility];  Service: Gynecology;  Laterality: Bilateral;   BLADDER SUSPENSION N/A 03/28/2016   Procedure: TRANSVAGINAL TAPE (TVT) PROCEDURE exact midurethral sling;  Surgeon: Patton Salles, MD;  Location: [facility];  Service: Gynecology;  Laterality: N/A;   COLONOSCOPY     CYSTOSCOPY N/A 03/28/2016   Procedure: CYSTOSCOPY;  Surgeon: Patton Salles, MD;  Location: [facility];  Service: Gynecology;  Laterality: N/A;   CYSTOSCOPY WITH BIOPSY Bilateral 11/03/2015   Procedure: CYSTOSCOPY WITH URETHRAL BIOPSY, FULGERATION, BILATERAL RETROGRADE PYELOGRAMS;  Surgeon: Sebastian Ache, MD;  Location[facility];  Service: Urology;  Laterality: Bilateral;   GAS INSERTION Right 03/22/2023   Procedure: INSERTION OF GAS;  Surgeon: Rennis Chris, MD;  Location: [facility] OR;  Service: Ophthalmology;  Laterality: Right;   LYSIS OF ADHESION N/A 03/28/2016   Procedure: LYSIS OF ADHESION;  Surgeon: Patton Salles, MD;  Location: [facility];  Service: Gynecology;  Laterality: N/A;   MASS EXCISION N/A 11/03/2015   Procedure: EXCISION  URETHRAL MASS;  Surgeon: Sebastian Ache, MD;  Location: [facility];  Service: Urology;  Laterality: N/A;   SCLERAL BUCKLE Right 03/22/2023   Procedure: SCLERAL BUCKLE;  Surgeon: Rennis Chris, MD;  Location: [facility] OR;  Service: Ophthalmology;  Laterality: Right;   TUBAL LIGATION     VAGINAL HYSTERECTOMY  1999   VITRECTOMY 25 GAUGE WITH SCLERAL BUCKLE Right 03/22/2023   Procedure:  VITRECTOMY 25 GAUGE;  Surgeon: Rennis Chris, MD;  Location: [facility] OR;  Service: Ophthalmology;  Laterality: Right;   FAMILY HISTORY Family History  Problem Relation Age of Onset   Hypertension Mother    Osteoporosis Mother    Paget's disease of bone Mother    Heart failure Mother    Cancer Father        MELANOMA   Diabetes Father    Hypertension Father    Heart disease Father    Cancer Sister        THYROID   Breast cancer Paternal Grandmother    Colon cancer Neg Hx    Pancreatic cancer Neg Hx    Esophageal cancer Neg Hx    SOCIAL HISTORY Social History   Tobacco Use   Smoking status: Never   Smokeless tobacco: Never  Vaping Use   Vaping status: Never Used  Substance Use Topics   Alcohol use: Not Currently   Drug use: No       OPHTHALMIC EXAM:  Base Eye Exam     Visual Acuity (Snellen - Linear)       Right Left   Dist [place] CF at 3'    Dist cc  20/25   Dist ph [place] 20/400    Dist ph cc  NI         Tonometry (Tonopen, 2:23 PM)       Right Left   Pressure 13 14         Neuro/Psych     Oriented x3: Yes   Mood/Affect: Normal            Slit Lamp and Fundus Exam     Slit Lamp Exam       Right Left   Lids/Lashes Dermatochalasis - upper lid, Meibomian gland dysfunction Dermatochalasis - upper lid, mild MGD   Conjunctiva/Sclera sutures disolving, Subconjunctival hemorrhage- improved White and quiet   Cornea arcus; 1+ fine Punctate epithelial erosions 1+ Punctate epithelial erosions   Anterior Chamber Deep and clear deep, 3-4+ fine pigment   Iris Round and dilated Round and dilated   Lens 2-3+ Nuclear sclerosis, 2-3+ Cortical cataract, 2-3+PSC 2-3+ Nuclear sclerosis, 2-3+ Cortical cataract   Anterior Vitreous post vitrectomy, gas bubble gone syneresis, fine pigment, Posterior vitreous detachment, vitreous condensations         Fundus Exam       Right Left   Disc Pink and Sharp, Compact Pink and Sharp, Compact   C/D Ratio 0.2 0.1   Macula Flat, Blunted foveal reflex, ERM with central retinal thickening and fibrosis Flat, Blunted foveal reflex, RPE mottling and clumping, No heme or edema   Vessels attenuated, Tortuous attenuated, Tortuous   Periphery Attached over buckle, good buckle height, good laser over buckle and around tears; PRE:OP, Bullous temporal detachment from 0800-1030 with bi-lobed HST at 1030 Attached, No heme           IMAGING AND PROCEDURES  Imaging and Procedures for 06/05/2023  OCT, Retina - OU - Both Eyes       Right Eye Quality was good. Central Foveal Thickness: 570. Progression has improved. Findings include no IRF, no SRF, abnormal foveal contour, intraretinal hyper-reflective material, epiretinal membrane, outer retinal atrophy, preretinal fibrosis (Retina reattached, diffuse retinal atrophy, ERM with mild interval improvement in central retinal thickening and PRF).   Left Eye Quality was good. Central Foveal Thickness: 277. Progression has been stable. Findings include normal foveal contour, no IRF, no SRF (Partial PVD).   Notes *Images  captured and stored on drive  Diagnosis /  Impression:  OD: Retina reattached, diffuse retinal atrophy, ERM with mild interval improvement in central retinal thickening and PRF OS: NFP; no IRF/SRF   Clinical management:  See below  Abbreviations: NFP - Normal foveal profile. CME - cystoid macular edema. PED - pigment epithelial detachment. IRF - intraretinal fluid. SRF - subretinal fluid. EZ - ellipsoid zone. ERM - epiretinal membrane. ORA - outer retinal atrophy. ORT - outer retinal tubulation. SRHM - subretinal hyper-reflective material. IRHM - intraretinal hyper-reflective material             ASSESSMENT/PLAN:   ICD-10-CM   1. Right retinal detachment  H33.21 OCT, Retina - OU - Both Eyes    2. Epiretinal membrane (ERM) of right eye  H35.371 OCT, Retina - OU - Both Eyes    3. Essential hypertension  I10     4. Hypertensive retinopathy of both eyes  H35.033     5. Combined forms of age-related cataract of both eyes  H25.813       Rhegmatogenous retinal detachment, OD - bullous temporal mac off detachment -- pt reports center involvement maybe 1-2 wks prior - detached from 0800 to 1030, fovea off, bi-lobed HST at 1030 - s/p SB + PPV/PFO/EL/FAX/14% C3F8 OD, 12.05.2024             - retina attached and in good position -- good buckle height and laser around breaks             - IOP  13  - OCT shows OD Retina reattached, diffuse retinal atrophy, +ERM  - gas bubble gone             - dec PF to BID OD -- okay to stop   PSO ung BID OD -- pt stopped due to itching -- okay to stopn  - f/u 2 months -- DFE/OCT  2. Epiretinal membrane, right eye  - original exam showed dense ERM with central thickening and pre retinal fibrosis - persistent - will likely benefit from PPV w/ membrane peel once cataract extracted  3,4. Hypertensive retinopathy OU - discussed importance of tight BP control - monitor  5. Mixed Cataract OU - The symptoms of cataract, surgical options, and treatments and risks were discussed with patient. -  discussed diagnosis and progression - will refer to [facility] [place] for consult - cleared from a retina standpoint to proceed with cataract surgery  Ophthalmic Meds Ordered this visit:  No orders of the defined types were placed in this encounter.    Return in about 2 months (around 08/03/2023) for f/u RD OD, DFE, OCT.  There are no Patient Instructions on file for this visit.  Explained the diagnoses, plan, and follow up with the patient and they expressed understanding.  Patient expressed understanding of the importance of proper follow up care.   This document serves as a record of services personally performed by Karie Chimera, MD, PhD. It was created on their behalf by Charlette Caffey, COT an ophthalmic technician. The creation of this record is the provider's dictation and/or activities during the visit.    Electronically signed by:  Charlette Caffey, COT  06/06/23 2:37 PM  This document serves as a record of services personally performed by Karie Chimera, MD, PhD. It was created on their behalf by Glee Arvin. Manson Passey, OA an ophthalmic technician. The creation of this record is the provider's dictation and/or activities during the visit.  Electronically signed by: Glee Arvin. Manson Passey, OA 06/06/23 2:37 PM  Karie Chimera, M.D., Ph.D. Diseases & Surgery of the Retina and Vitreous [facility] 06/05/2023  I have reviewed the above documentation for accuracy and completeness, and I agree with the above. Karie Chimera, M.D., Ph.D. 06/06/23 2:37 PM   Abbreviations: M myopia (nearsighted); A astigmatism; H hyperopia (farsighted); P presbyopia; Mrx spectacle prescription;  CTL contact lenses; OD right eye; OS left eye; OU both eyes  XT exotropia; ET esotropia; PEK punctate epithelial keratitis; PEE punctate epithelial erosions; DES dry eye syndrome; MGD meibomian gland dysfunction; ATs artificial tears; PFAT's preservative free artificial  tears; NSC nuclear sclerotic cataract; PSC posterior subcapsular cataract; ERM epi-retinal membrane; PVD posterior vitreous detachment; RD retinal detachment; DM diabetes mellitus; DR diabetic retinopathy; NPDR non-proliferative diabetic retinopathy; PDR proliferative diabetic retinopathy; CSME clinically significant macular edema; DME diabetic macular edema; dbh dot blot hemorrhages; CWS cotton wool spot; POAG primary open angle glaucoma; C/D cup-to-disc ratio; HVF humphrey visual field; GVF goldmann visual field; OCT optical coherence tomography; IOP intraocular pressure; BRVO Branch retinal vein occlusion; CRVO central retinal vein occlusion; CRAO central retinal artery occlusion; BRAO branch retinal artery occlusion; RT retinal tear; SB scleral buckle; PPV pars plana vitrectomy; VH Vitreous hemorrhage; PRP panretinal laser photocoagulation; IVK intravitreal kenalog; VMT vitreomacular traction; MH Macular hole;  NVD neovascularization of the disc; NVE neovascularization elsewhere; AREDS age related eye disease study; ARMD age related macular degeneration; POAG primary open angle glaucoma; EBMD epithelial/anterior basement membrane dystrophy; ACIOL anterior chamber intraocular lens; IOL intraocular lens; PCIOL posterior chamber intraocular lens; Phaco/IOL phacoemulsification with intraocular lens placement; PRK photorefractive keratectomy; LASIK laser assisted in situ keratomileusis; HTN hypertension; DM diabetes mellitus; COPD chronic obstructive pulmonary disease

## 2023-05-31 ENCOUNTER — Other Ambulatory Visit: Payer: Self-pay | Admitting: Obstetrics and Gynecology

## 2023-05-31 DIAGNOSIS — Z1231 Encounter for screening mammogram for malignant neoplasm of breast: Secondary | ICD-10-CM

## 2023-06-05 ENCOUNTER — Ambulatory Visit (INDEPENDENT_AMBULATORY_CARE_PROVIDER_SITE_OTHER): Payer: PPO | Admitting: Ophthalmology

## 2023-06-05 ENCOUNTER — Encounter (INDEPENDENT_AMBULATORY_CARE_PROVIDER_SITE_OTHER): Payer: Self-pay | Admitting: Ophthalmology

## 2023-06-05 DIAGNOSIS — I10 Essential (primary) hypertension: Secondary | ICD-10-CM

## 2023-06-05 DIAGNOSIS — H3321 Serous retinal detachment, right eye: Secondary | ICD-10-CM | POA: Diagnosis not present

## 2023-06-05 DIAGNOSIS — H21233 Degeneration of iris (pigmentary), bilateral: Secondary | ICD-10-CM

## 2023-06-05 DIAGNOSIS — H25813 Combined forms of age-related cataract, bilateral: Secondary | ICD-10-CM

## 2023-06-05 DIAGNOSIS — H35033 Hypertensive retinopathy, bilateral: Secondary | ICD-10-CM

## 2023-06-05 DIAGNOSIS — H35371 Puckering of macula, right eye: Secondary | ICD-10-CM

## 2023-06-18 DIAGNOSIS — G629 Polyneuropathy, unspecified: Secondary | ICD-10-CM | POA: Diagnosis not present

## 2023-06-18 DIAGNOSIS — E559 Vitamin D deficiency, unspecified: Secondary | ICD-10-CM | POA: Diagnosis not present

## 2023-06-18 DIAGNOSIS — K580 Irritable bowel syndrome with diarrhea: Secondary | ICD-10-CM | POA: Diagnosis not present

## 2023-06-18 DIAGNOSIS — Z1331 Encounter for screening for depression: Secondary | ICD-10-CM | POA: Diagnosis not present

## 2023-06-18 DIAGNOSIS — D518 Other vitamin B12 deficiency anemias: Secondary | ICD-10-CM | POA: Diagnosis not present

## 2023-06-18 DIAGNOSIS — F411 Generalized anxiety disorder: Secondary | ICD-10-CM | POA: Diagnosis not present

## 2023-06-18 DIAGNOSIS — Z0001 Encounter for general adult medical examination with abnormal findings: Secondary | ICD-10-CM | POA: Diagnosis not present

## 2023-06-18 DIAGNOSIS — E039 Hypothyroidism, unspecified: Secondary | ICD-10-CM | POA: Diagnosis not present

## 2023-06-18 DIAGNOSIS — I10 Essential (primary) hypertension: Secondary | ICD-10-CM | POA: Diagnosis not present

## 2023-06-18 DIAGNOSIS — Z6822 Body mass index (BMI) 22.0-22.9, adult: Secondary | ICD-10-CM | POA: Diagnosis not present

## 2023-06-27 DIAGNOSIS — H04123 Dry eye syndrome of bilateral lacrimal glands: Secondary | ICD-10-CM | POA: Diagnosis not present

## 2023-06-27 DIAGNOSIS — H59811 Chorioretinal scars after surgery for detachment, right eye: Secondary | ICD-10-CM | POA: Diagnosis not present

## 2023-06-27 DIAGNOSIS — H35371 Puckering of macula, right eye: Secondary | ICD-10-CM | POA: Diagnosis not present

## 2023-06-27 DIAGNOSIS — H2513 Age-related nuclear cataract, bilateral: Secondary | ICD-10-CM | POA: Diagnosis not present

## 2023-07-03 ENCOUNTER — Ambulatory Visit
Admission: RE | Admit: 2023-07-03 | Discharge: 2023-07-03 | Disposition: A | Discharge status: Home / Self Care | Payer: PPO | Source: Ambulatory Visit | Attending: Obstetrics and Gynecology | Admitting: Obstetrics and Gynecology

## 2023-07-03 DIAGNOSIS — Z1231 Encounter for screening mammogram for malignant neoplasm of breast: Secondary | ICD-10-CM

## 2023-07-05 ENCOUNTER — Encounter: Payer: Self-pay | Admitting: Obstetrics and Gynecology

## 2023-07-20 ENCOUNTER — Other Ambulatory Visit: Payer: Self-pay | Admitting: Obstetrics and Gynecology

## 2023-07-20 DIAGNOSIS — Z780 Asymptomatic menopausal state: Secondary | ICD-10-CM

## 2023-07-20 NOTE — Telephone Encounter (Signed)
 Med refill request: Estradiol 0.1 MG/GM vaginal cream Last AEX: 06/13/2022 BS Next AEX: 10/22/2023 BS Last MMG (if hormonal med) 07/03/2023 Refill authorized: Last Rx sent #[id number] with one refill on 06/13/2022. Please approve or deny as appropriate.

## 2023-07-25 DIAGNOSIS — L814 Other melanin hyperpigmentation: Secondary | ICD-10-CM | POA: Diagnosis not present

## 2023-07-25 DIAGNOSIS — L821 Other seborrheic keratosis: Secondary | ICD-10-CM | POA: Diagnosis not present

## 2023-07-25 DIAGNOSIS — L578 Other skin changes due to chronic exposure to nonionizing radiation: Secondary | ICD-10-CM | POA: Diagnosis not present

## 2023-07-25 DIAGNOSIS — D229 Melanocytic nevi, unspecified: Secondary | ICD-10-CM | POA: Diagnosis not present

## 2023-07-25 DIAGNOSIS — D1801 Hemangioma of skin and subcutaneous tissue: Secondary | ICD-10-CM | POA: Diagnosis not present

## 2023-07-25 DIAGNOSIS — T1490XD Injury, unspecified, subsequent encounter: Secondary | ICD-10-CM | POA: Diagnosis not present

## 2023-07-25 DIAGNOSIS — L570 Actinic keratosis: Secondary | ICD-10-CM | POA: Diagnosis not present

## 2023-07-31 DIAGNOSIS — H2521 Age-related cataract, morgagnian type, right eye: Secondary | ICD-10-CM | POA: Diagnosis not present

## 2023-08-01 NOTE — Progress Notes (Signed)
 [facility] - Clinic Note  08/06/2023   CHIEF COMPLAINT Patient presents for Retina Follow Up  HISTORY OF PRESENT ILLNESS: Stephanie Armstrong is a 68 y.o. female who presents to the clinic today for:  HPI     Retina Follow Up   Patient presents with  Retinal Break/Detachment.  In right eye.  This started 4 months ago.  Duration of 4 months.  Since onset it is stable.  I, the attending physician,  performed the HPI with the patient and updated documentation appropriately.        Comments   4 month retina follow up RD OD pt is reporting vision is about the same she denies any flashes or floaters she is having cataract surgery Friday Dr Artemio Larry       Last edited by [name], MD on 08/08/2023  9:55 PM.    Patient is having cataract sx OD with Dr. Artemio Larry on Friday, April 25th  Referring physician: Kathyleen Parkins, MD [address] [place],  [place] [address]  HISTORICAL INFORMATION:  Selected notes from the MEDICAL RECORD NUMBER Referred by Dr. Barbra Boone for concern of macular hole OD LEE:  Ocular Hx- PMH-   CURRENT MEDICATIONS: Current Outpatient Medications (Ophthalmic Drugs)  Medication Sig   Bromfenac  Sodium (PROLENSA ) 0.07 % SOLN Place 1 drop into both eyes 4 (four) times daily. (Patient not taking: Reported on 05/08/2023)   prednisoLONE  acetate (PRED FORTE ) 1 % ophthalmic suspension Place 1 drop into both eyes 4 (four) times daily.   No current facility-administered medications for this visit. (Ophthalmic Drugs)   Current Outpatient Medications (Other)  Medication Sig   ALPRAZolam  (XANAX ) 0.5 MG tablet Take 1 tab p.o. qhs for 2 weeks, then take 1/2 tab p.o. qhs, then take 1/2 tab qod, then discontinue. (Patient taking differently: Take 0.25 mg by mouth 2 (two) times daily as needed for anxiety.)   amLODipine-benazepril (LOTREL) 5-40 MG capsule TAKE (1) CAPSULE BY MOUTH ONCE DAILY.   ascorbic acid (VITAMIN C) 500 MG tablet Take 500 mg by mouth 3  (three) times a week.   Cholecalciferol (VITAMIN D3) 125 MCG (5000 UT) CAPS Take 5,000 Units by mouth daily.   diphenoxylate-atropine  (LOMOTIL) 2.5-0.025 MG tablet TAKE 2 TABLETS BY MOUTH 4 TIMES DAILY AS NEEDED FOR DIARRHEA OR LOOSE STOOLS. MAX 8 TABLETS IN 24 HOURS.   estradiol  (ESTRACE ) 0.1 MG/GM vaginal cream Place 1/2 gram vaginally at hs two to three times per week as needed.   fluorouracil (EFUDEX) 5 % cream Apply 1 Application topically daily as needed (skin cancer issue).   hydrochlorothiazide (HYDRODIURIL) 25 MG tablet TAKE ONE TABLET BY MOUTH ONCE DAILY AS NEEDED. DO NOT EXCEED 3 PER WEEK. APPT REQUIRED FOR FUTURE REFILLS   HYDROcodone -acetaminophen  (NORCO/VICODIN) 5-325 MG tablet Take 1 tablet by mouth every 4 (four) hours as needed for moderate pain (pain score 4-6).   HYDROcodone -acetaminophen  (NORCO/VICODIN) 5-325 MG tablet Take 1 tablet by mouth every 4 (four) hours as needed for moderate pain (pain score 4-6).   ibuprofen  (ADVIL ) 800 MG tablet Take 1 tablet (800 mg total) by mouth every 8 (eight) hours as needed.   pantoprazole (PROTONIX) 40 MG tablet Take 40 mg by mouth daily as needed (acid reflux).   Probiotic, Lactobacillus, CAPS Take 1 capsule by mouth daily.   Semaglutide,0.25 or 0.5MG /DOS, (OZEMPIC, 0.25 OR 0.5 MG/DOSE,) 2 MG/1.5ML SOPN Inject 0.5 mg into the skin once a week. (Patient not taking: Reported on 06/05/2023)   tretinoin (RETIN-A) 0.05 %  cream Apply 1 Application topically 3 (three) times a week.   TRULANCE  3 MG TABS TAKE 1 TABLET BY MOUTH DAILY   zolpidem (AMBIEN) 10 MG tablet Take 10 mg by mouth at bedtime.   No current facility-administered medications for this visit. (Other)   REVIEW OF SYSTEMS: ROS   Positive for: Constitutional, Gastrointestinal, Eyes Negative for: Neurological, Skin, Genitourinary, Musculoskeletal, HENT, Endocrine, Cardiovascular, Respiratory, Psychiatric, Allergic/Imm, Heme/Lymph Last edited by Alise Appl, COT on 08/06/2023  12:37 PM.      ALLERGIES Allergies  Allergen Reactions   Amoxicillin-Pot Clavulanate Diarrhea   Codeine Nausea Only   PAST MEDICAL HISTORY Past Medical History:  Diagnosis Date   Allergy    Anxiety    Arthritis    fingers   ASCUS with positive high risk human papillomavirus of vagina dx 10/11/15   Cancer (HCC)    basal cell skin   Hepatitis    age 80 , unknown type   History of vulvar dysplasia    VIN 2   Hypertension    IBS (irritable bowel syndrome)    Osteopenia 2018   Rectocele    Urethral pain    mass   Urinary incontinence    VAIN II (vaginal intraepithelial neoplasia grade II) dx 08/2015   right and left labia minora. This is really VIN II and not VAIN II.    Wears contact lenses    Past Surgical History:  Procedure Laterality Date   ABDOMINAL SACROCOLPOPEXY N/A 03/28/2016   Procedure: ABDOMINO SACROCOLPOPEXY;  Surgeon: Greta Leatherwood, MD;  Location: [facility];  Service: Gynecology;  Laterality: N/A;   ANAL RECTAL MANOMETRY N/A 08/09/2022   Procedure: ANO RECTAL MANOMETRY;  Surgeon: Tobin Forts, MD;  Location: [facility];  Service: Gastroenterology;  Laterality: N/A;   ANTERIOR AND POSTERIOR REPAIR N/A 03/28/2016   Procedure: ANTERIOR (CYSTOCELE) AND POSTERIOR REPAIR (RECTOCELE);  Surgeon: Greta Leatherwood, MD;  Location: [facility];  Service: Gynecology;  Laterality: N/A;   AUGMENTATION MAMMAPLASTY  APRIL 2013   BREAST IMPLANT CHANGED   BILATERAL SALPINGECTOMY Bilateral 03/28/2016   Procedure: BILATERAL SALPINGECTOMY;  Surgeon: Greta Leatherwood, MD;  Location: [facility];  Service: Gynecology;  Laterality: Bilateral;   BLADDER SUSPENSION N/A 03/28/2016   Procedure: TRANSVAGINAL TAPE (TVT) PROCEDURE exact midurethral sling;  Surgeon: Greta Leatherwood, MD;  Location: [facility];  Service: Gynecology;  Laterality: N/A;   COLONOSCOPY     CYSTOSCOPY N/A 03/28/2016   Procedure: CYSTOSCOPY;  Surgeon: Greta Leatherwood, MD;  Location: [facility];  Service: Gynecology;  Laterality: N/A;   CYSTOSCOPY WITH BIOPSY Bilateral 11/03/2015   Procedure: CYSTOSCOPY WITH URETHRAL BIOPSY, FULGERATION, BILATERAL RETROGRADE PYELOGRAMS;  Surgeon: Osborn Blaze, MD;  Location: [facility];  Service: Urology;  Laterality: Bilateral;   GAS INSERTION Right 03/22/2023   Procedure: INSERTION OF GAS;  Surgeon: [name], MD;  [facility];  Service: Ophthalmology;  Laterality: Right;   LYSIS OF ADHESION N/A 03/28/2016   Procedure: LYSIS OF ADHESION;  Surgeon: Greta Leatherwood, MD;  Location: [facility];  Service: Gynecology;  Laterality: N/A;   MASS EXCISION N/A 11/03/2015   Procedure: EXCISION  URETHRAL MASS;  Surgeon: Osborn Blaze, MD;  [facility]  Service: Urology;  Laterality: N/A;   SCLERAL BUCKLE Right 03/22/2023   Procedure: SCLERAL BUCKLE;  Surgeon: [name], MD;  Location: [facility] OR;  Service: Ophthalmology;  Laterality: Right;  TUBAL LIGATION     VAGINAL HYSTERECTOMY  1999   VITRECTOMY 25 GAUGE WITH SCLERAL BUCKLE Right 03/22/2023   Procedure: VITRECTOMY 25 GAUGE;  Surgeon: [name], MD;  Location: [facility] OR;  Service: Ophthalmology;  Laterality: Right;   FAMILY HISTORY Family History  Problem Relation Age of Onset   Hypertension Mother    Osteoporosis Mother    Paget's disease of bone Mother    Heart failure Mother    Cancer Father        MELANOMA   Diabetes Father    Hypertension Father    Heart disease Father    Cancer Sister        THYROID    Breast cancer Paternal Grandmother    Colon cancer Neg Hx    Pancreatic cancer Neg Hx    Esophageal cancer Neg Hx    SOCIAL HISTORY Social History   Tobacco Use   Smoking status: Never   Smokeless tobacco: Never  Vaping Use   Vaping status: Never Used  Substance Use Topics   Alcohol use: Not Currently   Drug use: No       OPHTHALMIC EXAM:  Base Eye Exam     Visual Acuity (Snellen - Linear)       Right Left   Dist cc  CF at 3' 20/25   Dist ph cc NI NI         Tonometry (Applanation, 12:43 PM)       Right Left   Pressure 16 18         Pupils       Dark Light Shape React APD   Right 5 4 Round Sluggish None   Left 4 3 Round Brisk None         Visual Fields       Left Right    Full Full         Extraocular Movement       Right Left    Full, Ortho Full, Ortho         Neuro/Psych     Oriented x3: Yes   Mood/Affect: Normal         Dilation     Both eyes: 2.5% Phenylephrine  @ 12:43 PM           Slit Lamp and Fundus Exam     Slit Lamp Exam       Right Left   Lids/Lashes Dermatochalasis - upper lid Dermatochalasis - upper lid, mild MGD   Conjunctiva/Sclera White and quiet White and quiet   Cornea arcus; 1-2+ fine Punctate epithelial erosions 1+ Punctate epithelial erosions   Anterior Chamber Deep and clear deep and clear   Iris Irregular dilation, focal PS at 0130 and 0200 Round and dilated   Lens 2-3+ Nuclear sclerosis, 2-3+ Cortical cataract, 2-3+PSC 2-3+ Nuclear sclerosis, 2-3+ Cortical cataract   Anterior Vitreous post vitrectomy, gas bubble gone syneresis, fine pigment, Posterior vitreous detachment, vitreous condensations         Fundus Exam       Right Left   Disc Pink and Sharp, Compact Pink and Sharp, Compact   C/D Ratio 0.2 0.1   Macula Flat, Blunted foveal reflex, ERM with central retinal thickening and fibrosis, no heme, RPE mottling Flat, Blunted foveal reflex, RPE mottling and clumping, No heme or edema   Vessels attenuated, Tortuous attenuated, Tortuous   Periphery Attached over buckle, good buckle height, good laser over buckle and around tears; PRE:OP, Bullous temporal detachment from 0800-1030 with  bi-lobed HST at 1030 Attached, No heme, mild paving stone degeneration inferiorly, mild reticular degeneration           IMAGING AND PROCEDURES  Imaging and Procedures for 08/06/2023  OCT, Retina - OU - Both Eyes       Right Eye Quality  was good. Central Foveal Thickness: 276. Progression has been stable. Findings include no IRF, no SRF, abnormal foveal contour, intraretinal hyper-reflective material, epiretinal membrane, outer retinal atrophy, preretinal fibrosis (Retina reattached, diffuse retinal atrophy, persistent ERM with central retinal thickening and PRF).   Left Eye Quality was good. Central Foveal Thickness: 271. Progression has been stable. Findings include normal foveal contour, no IRF, no SRF (Partial PVD).   Notes *Images captured and stored on drive  Diagnosis / Impression:  OD: Retina reattached, diffuse retinal atrophy, persistent ERM with mild central retinal thickening and PRF OS: NFP; no IRF/SRF   Clinical management:  See below  Abbreviations: NFP - Normal foveal profile. CME - cystoid macular edema. PED - pigment epithelial detachment. IRF - intraretinal fluid. SRF - subretinal fluid. EZ - ellipsoid zone. ERM - epiretinal membrane. ORA - outer retinal atrophy. ORT - outer retinal tubulation. SRHM - subretinal hyper-reflective material. IRHM - intraretinal hyper-reflective material           ASSESSMENT/PLAN:   ICD-10-CM   1. Right retinal detachment  H33.21     2. Epiretinal membrane (ERM) of right eye  H35.371 OCT, Retina - OU - Both Eyes    3. Essential hypertension  I10     4. Hypertensive retinopathy of both eyes  H35.033     5. Combined forms of age-related cataract of both eyes  H25.813     6. Pigment dispersion syndrome of both eyes  H21.233      Rhegmatogenous retinal detachment, OD - bullous temporal mac off detachment -- pt reports center involvement maybe 1-2 wks prior - detached from 0800 to 1030, fovea off, bi-lobed HST at 1030 - s/p SB + PPV/PFO/EL/FAX/14% C3F8 OD, 12.05.2024             - retina attached and in good position -- good buckle height and laser around breaks             - IOP  16  - OCT shows OD Retina reattached, diffuse retinal atrophy, persistent ERM with  central thickening and PRF  - f/u 2-3 months -- DFE/OCT  2. Epiretinal membrane, right eye  - original exam showed dense ERM with central thickening and pre retinal fibrosis - persistent - will likely benefit from PPV w/ membrane peel once cataract extracted  3,4. Hypertensive retinopathy OU - discussed importance of tight BP control - monitor  5. Mixed Cataract OU - The symptoms of cataract, surgical options, and treatments and risks were discussed with patient. - discussed diagnosis and progression - surgery schedule for Friday, April 25th with Dr. Artemio Larry  Ophthalmic Meds Ordered this visit:  No orders of the defined types were placed in this encounter.    Return for f/u 2-3 months, ERM OD, DFE, OCT.  There are no Patient Instructions on file for this visit.  Explained the diagnoses, plan, and follow up with the patient and they expressed understanding.  Patient expressed understanding of the importance of proper follow up care.   This document serves as a record of services personally performed by Jeanice Millard, MD, PhD. It was created on their behalf by Morley Arabia. Bevin Bucks, OA an ophthalmic technician. The creation  of this record is the provider's dictation and/or activities during the visit.    Electronically signed by: Morley Arabia. Bevin Bucks, OA 08/08/23 10:02 PM  Jeanice Millard, M.D., Ph.D. Diseases & Surgery of the Retina and Vitreous [facility] 08/06/2023  I have reviewed the above documentation for accuracy and completeness, and I agree with the above. Jeanice Millard, M.D., Ph.D. 08/08/23 10:02 PM   Abbreviations: M myopia (nearsighted); A astigmatism; H hyperopia (farsighted); P presbyopia; Mrx spectacle prescription;  CTL contact lenses; OD right eye; OS left eye; OU both eyes  XT exotropia; ET esotropia; PEK punctate epithelial keratitis; PEE punctate epithelial erosions; DES dry eye syndrome; MGD meibomian gland dysfunction; ATs artificial tears;  PFAT's preservative free artificial tears; NSC nuclear sclerotic cataract; PSC posterior subcapsular cataract; ERM epi-retinal membrane; PVD posterior vitreous detachment; RD retinal detachment; DM diabetes mellitus; DR diabetic retinopathy; NPDR non-proliferative diabetic retinopathy; PDR proliferative diabetic retinopathy; CSME clinically significant macular edema; DME diabetic macular edema; dbh dot blot hemorrhages; CWS cotton wool spot; POAG primary open angle glaucoma; C/D cup-to-disc ratio; HVF humphrey visual field; GVF goldmann visual field; OCT optical coherence tomography; IOP intraocular pressure; BRVO Branch retinal vein occlusion; CRVO central retinal vein occlusion; CRAO central retinal artery occlusion; BRAO branch retinal artery occlusion; RT retinal tear; SB scleral buckle; PPV pars plana vitrectomy; VH Vitreous hemorrhage; PRP panretinal laser photocoagulation; IVK intravitreal kenalog ; VMT vitreomacular traction; MH Macular hole;  NVD neovascularization of the disc; NVE neovascularization elsewhere; AREDS age related eye disease study; ARMD age related macular degeneration; POAG primary open angle glaucoma; EBMD epithelial/anterior basement membrane dystrophy; ACIOL anterior chamber intraocular lens; IOL intraocular lens; PCIOL posterior chamber intraocular lens; Phaco/IOL phacoemulsification with intraocular lens placement; PRK photorefractive keratectomy; LASIK laser assisted in situ keratomileusis; HTN hypertension; DM diabetes mellitus; COPD chronic obstructive pulmonary disease

## 2023-08-06 ENCOUNTER — Ambulatory Visit (INDEPENDENT_AMBULATORY_CARE_PROVIDER_SITE_OTHER): Payer: PPO | Admitting: Ophthalmology

## 2023-08-06 ENCOUNTER — Encounter (INDEPENDENT_AMBULATORY_CARE_PROVIDER_SITE_OTHER): Payer: Self-pay | Admitting: Ophthalmology

## 2023-08-06 DIAGNOSIS — H35371 Puckering of macula, right eye: Secondary | ICD-10-CM | POA: Diagnosis not present

## 2023-08-06 DIAGNOSIS — H3321 Serous retinal detachment, right eye: Secondary | ICD-10-CM

## 2023-08-06 DIAGNOSIS — H21233 Degeneration of iris (pigmentary), bilateral: Secondary | ICD-10-CM

## 2023-08-06 DIAGNOSIS — H25813 Combined forms of age-related cataract, bilateral: Secondary | ICD-10-CM

## 2023-08-06 DIAGNOSIS — I10 Essential (primary) hypertension: Secondary | ICD-10-CM | POA: Diagnosis not present

## 2023-08-06 DIAGNOSIS — H35033 Hypertensive retinopathy, bilateral: Secondary | ICD-10-CM

## 2023-08-07 ENCOUNTER — Other Ambulatory Visit: Payer: Self-pay

## 2023-08-07 ENCOUNTER — Encounter (HOSPITAL_COMMUNITY)
Admission: RE | Admit: 2023-08-07 | Discharge: 2023-08-07 | Disposition: A | Discharge status: Home / Self Care | Source: Ambulatory Visit | Attending: Optometry | Admitting: Optometry

## 2023-08-07 ENCOUNTER — Encounter (HOSPITAL_COMMUNITY): Payer: Self-pay

## 2023-08-07 NOTE — H&P (Signed)
 Surgical History & Physical  Patient Name: Stephanie Armstrong  DOB: June 19, 1955  Surgery: Cataract extraction with intraocular lens implant phacoemulsification; Right Eye Surgeon: Ardeth Krabbe MD Surgery Date: 08/10/2023 Pre-Op Date: 06/27/2023  HPI: A 35 Yr. old female patient 1. The patient is here for cataract eval-OD. Ref: [facility] Dr. Karyl Paget. Pt. complains of sunlight glare causing poor vision. The right eye is affected. The episode is constant. Symptoms occur when the patient is driving and reading. The complaint is associated with blurry vision. Pt. is seeing retina specialist. Pt. only wanted to be dilated in OS due to cataract. Pt. wears contacts-multi-focal.\r\nHPI was performed by Ardeth Krabbe .  Medical History: Cataracts ERM OD, RD w/repair OD 2024, Hypertensive Retinopathy OU  Anxiety, Hepatitis, IBS Arthritis Cancer High Blood Pressure  Review of Systems Cardiovascular High Blood Pressure Musculoskeletal arthritis pains Psychiatry Anxiety All recorded systems are negative except as noted above  Social Never smoked   Medication Estradiol , Semaglutide 2.5mg /ml Inj, Trulance , Alprazolam , Amlodipine-benazepril, Zolpidem  Sx/Procedures None  Drug Allergies  Codeine, Amoxicillin,   History & Physical: Heent: cataracts NECK: supple without bruits LUNGS: lungs clear to auscultation CV: regular rate and rhythm Abdomen: soft and non-tender  Impression & Plan: Assessment: 1.  CATARACT NUCLEAR SCLEROSIS AGE RELATED; Both Eyes (H25.13) 2.  Epiretinal Membrane; Right Eye (H35.371) 3.  Dry Eye Syndrome; Both Eyes (H04.123) 4.  RETINAL DETACHMENT RD PRIOR SURGICAL REPAIR; Right Eye (H59.811)  Plan: 1.  Cataracts are visually significant and account for the patient's complaints. Discussed all risks, benefits, procedures and recovery, including infection, loss of vision and eye, need for glasses after surgery or additional procedures. Patient understands  changing glasses will not improve vision. Patient indicated understanding of procedure. All questions answered. Patient desires to have surgery, recommend phacoemulsification with intraocular lens. Patient to have preliminary testing necessary ([place]os/IOL Master, Mac OCT, TOPO) Educational materials provided:Cataract.  Plan: - Proceed with cataract surgery OD, hold on OS for now - Plan for best distance target with DIB00 - No DM, no fuchs, no prior eye surgery - Hx of buckle and vitrectomy - good dilation - dextenza   2.  Discussed diagnosis in detail with patient. Discussed treatment options with patient including vitrectomy. Being treated by Dr. Karyl Paget  3.  Dry eye. Mild signs of dry eye at this time. Can use artificial tears QID OU PRN and warm compresses once daily as needed.  4.  With buckle and vitrectomy. attached macula

## 2023-08-08 ENCOUNTER — Encounter (INDEPENDENT_AMBULATORY_CARE_PROVIDER_SITE_OTHER): Payer: Self-pay | Admitting: Ophthalmology

## 2023-08-10 ENCOUNTER — Ambulatory Visit (HOSPITAL_COMMUNITY): Admitting: Certified Registered"

## 2023-08-10 ENCOUNTER — Other Ambulatory Visit: Payer: Self-pay

## 2023-08-10 ENCOUNTER — Encounter (HOSPITAL_COMMUNITY)
Admission: RE | Disposition: A | Discharge status: Home / Self Care | Payer: Self-pay | Source: Home / Self Care | Attending: Optometry

## 2023-08-10 ENCOUNTER — Ambulatory Visit (HOSPITAL_COMMUNITY)
Admission: RE | Admit: 2023-08-10 | Discharge: 2023-08-10 | Disposition: A | Discharge status: Home / Self Care | Attending: Optometry | Admitting: Optometry

## 2023-08-10 ENCOUNTER — Encounter (HOSPITAL_COMMUNITY): Payer: Self-pay | Admitting: Optometry

## 2023-08-10 DIAGNOSIS — H2511 Age-related nuclear cataract, right eye: Secondary | ICD-10-CM

## 2023-08-10 DIAGNOSIS — H59021 Cataract (lens) fragments in eye following cataract surgery, right eye: Secondary | ICD-10-CM | POA: Diagnosis not present

## 2023-08-10 DIAGNOSIS — I10 Essential (primary) hypertension: Secondary | ICD-10-CM | POA: Diagnosis not present

## 2023-08-10 DIAGNOSIS — H2521 Age-related cataract, morgagnian type, right eye: Secondary | ICD-10-CM | POA: Diagnosis not present

## 2023-08-10 DIAGNOSIS — F419 Anxiety disorder, unspecified: Secondary | ICD-10-CM | POA: Insufficient documentation

## 2023-08-10 DIAGNOSIS — H538 Other visual disturbances: Secondary | ICD-10-CM | POA: Insufficient documentation

## 2023-08-10 DIAGNOSIS — H04123 Dry eye syndrome of bilateral lacrimal glands: Secondary | ICD-10-CM | POA: Insufficient documentation

## 2023-08-10 DIAGNOSIS — H5711 Ocular pain, right eye: Secondary | ICD-10-CM | POA: Diagnosis not present

## 2023-08-10 DIAGNOSIS — H35371 Puckering of macula, right eye: Secondary | ICD-10-CM | POA: Insufficient documentation

## 2023-08-10 HISTORY — PX: CATARACT EXTRACTION W/PHACO: SHX586

## 2023-08-10 HISTORY — PX: INSERTION, STENT, DRUG-ELUTING, LACRIMAL CANALICULUS: SHX7453

## 2023-08-10 SURGERY — PHACOEMULSIFICATION, CATARACT, WITH IOL INSERTION
Anesthesia: Monitor Anesthesia Care | Site: Eye | Laterality: Right

## 2023-08-10 MED ORDER — LIDOCAINE HCL (PF) 1 % IJ SOLN
INTRAMUSCULAR | Status: AC
Start: 2023-08-10 — End: ?
  Filled 2023-08-10: qty 2

## 2023-08-10 MED ORDER — ACETAZOLAMIDE SODIUM 500 MG IJ SOLR
500.0000 mg | Freq: Once | INTRAMUSCULAR | Status: DC
  Filled 2023-08-10: qty 500

## 2023-08-10 MED ORDER — TROPICAMIDE 1 % OP SOLN
1.0000 [drp] | OPHTHALMIC | Status: AC
  Administered 2023-08-10 (×3): 1 [drp] via OPHTHALMIC

## 2023-08-10 MED ORDER — ACETAZOLAMIDE 250 MG PO TABS
500.0000 mg | ORAL_TABLET | Freq: Once | ORAL | Status: AC
  Administered 2023-08-10: 500 mg via ORAL
  Filled 2023-08-10: qty 2

## 2023-08-10 MED ORDER — PHENYLEPHRINE-KETOROLAC 1-0.3 % IO SOLN
INTRAOCULAR | Status: DC | PRN
  Administered 2023-08-10: 500 mL via OPHTHALMIC

## 2023-08-10 MED ORDER — MIDAZOLAM HCL 2 MG/2ML IJ SOLN
INTRAMUSCULAR | Status: AC
  Filled 2023-08-10: qty 2

## 2023-08-10 MED ORDER — TETRACAINE HCL 0.5 % OP SOLN
1.0000 [drp] | OPHTHALMIC | Status: AC
  Administered 2023-08-10 (×3): 1 [drp] via OPHTHALMIC

## 2023-08-10 MED ORDER — BSS IO SOLN
INTRAOCULAR | Status: DC | PRN
  Administered 2023-08-10: 15 mL via INTRAOCULAR

## 2023-08-10 MED ORDER — STERILE WATER FOR IRRIGATION IR SOLN
Status: DC | PRN
  Administered 2023-08-10: 250 mL

## 2023-08-10 MED ORDER — MOXIFLOXACIN HCL 5 MG/ML IO SOLN
INTRAOCULAR | Status: AC
Start: 2023-08-10 — End: ?
  Filled 2023-08-10: qty 1

## 2023-08-10 MED ORDER — MIDAZOLAM HCL 5 MG/5ML IJ SOLN
INTRAMUSCULAR | Status: DC | PRN
  Administered 2023-08-10: 2 mg via INTRAVENOUS

## 2023-08-10 MED ORDER — DEXAMETHASONE 0.4 MG OP INST
VAGINAL_INSERT | OPHTHALMIC | Status: AC
  Filled 2023-08-10: qty 1

## 2023-08-10 MED ORDER — PHENYLEPHRINE HCL 2.5 % OP SOLN
1.0000 [drp] | OPHTHALMIC | Status: AC
  Administered 2023-08-10 (×3): 1 [drp] via OPHTHALMIC

## 2023-08-10 MED ORDER — PHENYLEPHRINE-KETOROLAC 1-0.3 % IO SOLN
INTRAOCULAR | Status: AC
  Filled 2023-08-10: qty 4

## 2023-08-10 MED ORDER — SIGHTPATH DOSE#1 NA HYALUR & NA CHOND-NA HYALUR IO KIT
PACK | INTRAOCULAR | Status: DC | PRN
  Administered 2023-08-10: 1 via OPHTHALMIC

## 2023-08-10 MED ORDER — LIDOCAINE HCL 3.5 % OP GEL
1.0000 | Freq: Once | OPHTHALMIC | Status: AC
  Administered 2023-08-10: 1 via OPHTHALMIC

## 2023-08-10 MED ORDER — TETRACAINE 0.5 % OP SOLN OPTIME - NO CHARGE
OPHTHALMIC | Status: DC | PRN
  Administered 2023-08-10: 2 [drp] via OPHTHALMIC

## 2023-08-10 MED ORDER — POVIDONE-IODINE 5 % OP SOLN
OPHTHALMIC | Status: DC | PRN
  Administered 2023-08-10: 1 via OPHTHALMIC

## 2023-08-10 MED ORDER — DEXAMETHASONE 0.4 MG OP INST
VAGINAL_INSERT | OPHTHALMIC | Status: DC | PRN
  Administered 2023-08-10: .4 mg via OPHTHALMIC

## 2023-08-10 MED ORDER — MOXIFLOXACIN HCL 5 MG/ML IO SOLN
INTRAOCULAR | Status: DC | PRN
  Administered 2023-08-10: .2 mL via OPHTHALMIC

## 2023-08-10 MED ORDER — LIDOCAINE HCL (PF) 1 % IJ SOLN
INTRAMUSCULAR | Status: DC | PRN
  Administered 2023-08-10: 2 mL

## 2023-08-10 SURGICAL SUPPLY — 13 items
CATARACT SUITE SIGHTPATH (MISCELLANEOUS) ×1 IMPLANT
CLOTH BEACON ORANGE TIMEOUT ST (SAFETY) ×2 IMPLANT
DRSG TEGADERM 4X4.75 (GAUZE/BANDAGES/DRESSINGS) ×2 IMPLANT
EYE SHIELD UNIVERSAL CLEAR (GAUZE/BANDAGES/DRESSINGS) IMPLANT
FEE CATARACT SUITE SIGHTPATH (MISCELLANEOUS) ×2 IMPLANT
GLOVE BIOGEL PI IND STRL 7.0 (GLOVE) ×4 IMPLANT
NDL HYPO 18GX1.5 BLUNT FILL (NEEDLE) ×2 IMPLANT
NEEDLE HYPO 18GX1.5 BLUNT FILL (NEEDLE) ×1 IMPLANT
PAD ARMBOARD POSITIONER FOAM (MISCELLANEOUS) ×2 IMPLANT
POSITIONER HEAD 8X9X4 ADT (SOFTGOODS) ×2 IMPLANT
SYR TB 1ML LL NO SAFETY (SYRINGE) ×2 IMPLANT
TAPE SURG TRANSPORE 1 IN (GAUZE/BANDAGES/DRESSINGS) IMPLANT
WATER STERILE IRR 250ML POUR (IV SOLUTION) ×2 IMPLANT

## 2023-08-10 NOTE — Op Note (Signed)
 Date of procedure: 08/10/23  Pre-operative diagnosis: Visually significant age-related cataract, Right Eye; Prior Vitrectomy with Scleral Buckle, Right eye  Post-operative diagnosis: Visually significant age-related cataract, Right Eye H25.11, Retained Lens Fragments, Right eye H59.021   Procedure: Removal of cataract via phacoemulsification of the Right Eye (CPT [phone])  Attending surgeon: Ardeth Krabbe, MD  Anesthesia: MAC, Topical Akten  Complications: Zonular loss during cataract surgery with displacement of lens fragments within posterior segment, retained lens fragments  Estimated Blood Loss: <70mL (minimal)  Specimens: None  Implants: None  Indications:  Visually significant cataract, Right Eye  Procedure:  The patient was seen and identified in the pre-operative area. The operative eye was identified and dilated.  The operative eye was marked.  Topical anesthesia was administered to the operative eye.     The patient was then to the operative suite and placed in the supine position.  A timeout was performed confirming the patient, procedure to be performed, and all other relevant information. The patient's face was prepped and draped in the usual fashion for intra-ocular surgery.  A lid speculum was placed into the operative eye and the surgical microscope moved into place and focused. A superotemporal paracentesis and inferior temporal paracentesis were created using a 20 gauge paracentesis blade.  BSS mixed with Omidria , followed by 1% lidocaine  was injected into the anterior chamber.   Viscoelastic was injected into the anterior chamber.  A temporal clear-corneal main wound incision was created using a 2.32mm microkeratome.  A continuous curvilinear capsulorrhexis was initiated using an irrigating cystitome and completed using capsulorrhexis forceps.  Hydrodissection and hydrodeliniation were performed. A phacoemulsification handpiece and a chopper as a second instrument were used  to remove the nucleus and epinucleus. Upon turning infusion on for the phaco handpiece, the anterior chamber deepened considerably. The IOP was lowered to 50 and then subsequently to 35. A chop was attempted, but the lens moved farther posteriorly and temporal zonular loss was noted. Maneuvers were attempted to chop the lens into pieces without placing force on the capsular bag, however there was not sufficient zonular support. The remaining lens pieces were displaced into the vitreous cavity and could not be removed from an anterior approach. Given the prior vitrectomy, no anterior vitrectomy was performed. The irrigation/aspiration handpiece was used to remove any visible cortical material and any remaining viscoelastic.  The clear corneal wound and paracentesis wounds were then hydrated and checked with Weck-Cels to be watertight. 0.1mL of moxifloxacin  was injected into the anterior chamber.  The lid-speculum and drape was removed, and the patient's face was cleaned with a wet and dry 4x4. A clear shield was taped over the eye. The patient was taken to the post-operative care unit in good condition, having tolerated the procedure well.  Post-Op Instructions: The patient will follow up at [facility] for a same day post-operative evaluation and will receive all other orders and instructions.

## 2023-08-10 NOTE — Anesthesia Preprocedure Evaluation (Signed)
 Anesthesia Evaluation  Patient identified by MRN, date of birth, ID band Patient awake    Reviewed: Allergy & Precautions, H&P , NPO status , Patient's Chart, lab work & pertinent test results, reviewed documented beta blocker date and time   Airway Mallampati: II  TM Distance: >3 FB Neck ROM: full    Dental no notable dental hx.    Pulmonary neg pulmonary ROS   Pulmonary exam normal breath sounds clear to auscultation       Cardiovascular Exercise Tolerance: Good hypertension, negative cardio ROS  Rhythm:regular Rate:Normal     Neuro/Psych  PSYCHIATRIC DISORDERS Anxiety      Neuromuscular disease negative neurological ROS  negative psych ROS   GI/Hepatic negative GI ROS, Neg liver ROS,,,(+) Hepatitis -  Endo/Other  negative endocrine ROS    Renal/GU negative Renal ROS  negative genitourinary   Musculoskeletal   Abdominal   Peds  Hematology negative hematology ROS (+)   Anesthesia Other Findings   Reproductive/Obstetrics negative OB ROS                             Anesthesia Physical Anesthesia Plan  ASA: 2  Anesthesia Plan: MAC   Post-op Pain Management:    Induction:   PONV Risk Score and Plan:   Airway Management Planned:   Additional Equipment:   Intra-op Plan:   Post-operative Plan:   Informed Consent: I have reviewed the patients History and Physical, chart, labs and discussed the procedure including the risks, benefits and alternatives for the proposed anesthesia with the patient or authorized representative who has indicated his/her understanding and acceptance.     Dental Advisory Given  Plan Discussed with: CRNA  Anesthesia Plan Comments:        Anesthesia Quick Evaluation

## 2023-08-10 NOTE — Transfer of Care (Signed)
 Immediate Anesthesia Transfer of Care Note  Patient: Stephanie Armstrong  Procedure(s) Performed: PHACOEMULSIFICATION, CATARACT, WITH IOL INSERTION (Right: Eye) INSERTION, STENT, DRUG-ELUTING, LACRIMAL CANALICULUS (Right: Eye)  Patient Location: PACU  Anesthesia Type:MAC  Level of Consciousness: awake, alert , oriented, and patient cooperative  Airway & Oxygen Therapy: Patient Spontanous Breathing  Post-op Assessment: Report given to RN, Post -op Vital signs reviewed and stable, and Patient moving all extremities X 4  Post vital signs: Reviewed and stable  Last Vitals:  Vitals Value Taken Time  BP 123/80   Temp 97.9   Pulse 70   Resp 18   SpO2 97     Last Pain:  Vitals:   08/10/23 0711  TempSrc: Oral  PainSc: 0-No pain         Complications: No notable events documented.

## 2023-08-10 NOTE — Interval H&P Note (Signed)
 History and Physical Interval Note:  08/10/2023 7:46 AM  The H and P was reviewed and updated. The patient was examined.  No changes were found after exam.  The surgical eye was marked.   Stephanie Armstrong

## 2023-08-10 NOTE — Discharge Instructions (Signed)
 Please discharge patient when stable, will follow up today with Dr. Ilsa Iha at the [facility] office immediately following discharge.  Leave shield in place until visit.  All paperwork with discharge instructions will be given at the office.  [facility] Address:  [address]  [place], [place] [address]  Dr. Chaya Jan Phone: [phone]

## 2023-08-10 NOTE — Anesthesia Postprocedure Evaluation (Signed)
 Anesthesia Post Note  Patient: [name]  Procedure(s) Performed: PHACOEMULSIFICATION, CATARACT (Right: Eye) INSERTION, STENT, DRUG-ELUTING, LACRIMAL CANALICULUS (Right: Eye)  Patient location during evaluation: Phase II Anesthesia Type: MAC Level of consciousness: awake Pain management: pain level controlled Vital Signs Assessment: post-procedure vital signs reviewed and stable Respiratory status: spontaneous breathing and respiratory function stable Cardiovascular status: blood pressure returned to baseline and stable Postop Assessment: no headache and no apparent nausea or vomiting Anesthetic complications: no Comments: Late entry   No notable events documented.   Last Vitals:  Vitals:   08/10/23 0711 08/10/23 0830  BP: 114/72 123/80  Pulse: 68 72  Resp: 15 13  Temp: 36.5 C 36.6 C  SpO2: 100% 100%    Last Pain:  Vitals:   08/10/23 0830  TempSrc: Oral  PainSc: 0-No pain                 Stephanie Armstrong

## 2023-08-11 ENCOUNTER — Encounter (HOSPITAL_COMMUNITY): Payer: Self-pay | Admitting: Optometry

## 2023-08-16 ENCOUNTER — Encounter (INDEPENDENT_AMBULATORY_CARE_PROVIDER_SITE_OTHER): Payer: Self-pay | Admitting: Ophthalmology

## 2023-08-16 ENCOUNTER — Ambulatory Visit (INDEPENDENT_AMBULATORY_CARE_PROVIDER_SITE_OTHER): Admitting: Ophthalmology

## 2023-08-16 DIAGNOSIS — H35371 Puckering of macula, right eye: Secondary | ICD-10-CM | POA: Diagnosis not present

## 2023-08-16 DIAGNOSIS — H59021 Cataract (lens) fragments in eye following cataract surgery, right eye: Secondary | ICD-10-CM

## 2023-08-16 DIAGNOSIS — H3321 Serous retinal detachment, right eye: Secondary | ICD-10-CM

## 2023-08-16 DIAGNOSIS — I10 Essential (primary) hypertension: Secondary | ICD-10-CM | POA: Diagnosis not present

## 2023-08-16 DIAGNOSIS — H25812 Combined forms of age-related cataract, left eye: Secondary | ICD-10-CM

## 2023-08-16 DIAGNOSIS — H35033 Hypertensive retinopathy, bilateral: Secondary | ICD-10-CM

## 2023-08-16 NOTE — Progress Notes (Signed)
 [facility] - Clinic Note  08/16/2023   CHIEF COMPLAINT Patient presents for Retina Follow Up  HISTORY OF PRESENT ILLNESS: Stephanie Armstrong is a 68 y.o. female who presents to the clinic today for:  HPI     Retina Follow Up   Patient presents with  Retinal Break/Detachment.  In right eye.  This started 4 months ago.  Duration of 4 months.  Since onset it is stable.  I, the attending physician,  performed the HPI with the patient and updated documentation appropriately.        Comments   Patient had cataract surgery in the right eye 04.25.25. The bag broke and the lens fell back. She is using Pred OD QID, combo drop OD QID, Timolol  OD BID,and Brimonidine  OD QID. She is also taking Diamox  PO.       Last edited by [name], MD on 08/16/2023  9:53 PM.    Patient had cataract sx OD with Dr. Artemio Larry on Friday, April 25th, during the sx, the bag broke and the lens fell to the back of the eye, she is on a PF QID OD, a yellow cap drop QID that she just got yesterday and brimonidine  OD, she is also on a fluid pill  Referring physician: Kathyleen Parkins, MD [address] [place],  [place] [address]  HISTORICAL INFORMATION:  Selected notes from the MEDICAL RECORD NUMBER Referred by Dr. Barbra Boone for concern of macular hole OD LEE:  Ocular Hx- PMH-   CURRENT MEDICATIONS: Current Outpatient Medications (Ophthalmic Drugs)  Medication Sig   Bromfenac  Sodium (PROLENSA ) 0.07 % SOLN Place 1 drop into both eyes 4 (four) times daily. (Patient not taking: Reported on 05/08/2023)   prednisoLONE  acetate (PRED FORTE ) 1 % ophthalmic suspension Place 1 drop into both eyes 4 (four) times daily.   No current facility-administered medications for this visit. (Ophthalmic Drugs)   Current Outpatient Medications (Other)  Medication Sig   ALPRAZolam  (XANAX ) 0.5 MG tablet Take 1 tab p.o. qhs for 2 weeks, then take 1/2 tab p.o. qhs, then take 1/2 tab qod, then discontinue. (Patient  taking differently: Take 0.25 mg by mouth 2 (two) times daily as needed for anxiety.)   amLODipine-benazepril (LOTREL) 5-40 MG capsule TAKE (1) CAPSULE BY MOUTH ONCE DAILY.   ascorbic acid (VITAMIN C) 500 MG tablet Take 500 mg by mouth 3 (three) times a week.   Cholecalciferol (VITAMIN D3) 125 MCG (5000 UT) CAPS Take 5,000 Units by mouth daily.   diphenoxylate-atropine  (LOMOTIL) 2.5-0.025 MG tablet TAKE 2 TABLETS BY MOUTH 4 TIMES DAILY AS NEEDED FOR DIARRHEA OR LOOSE STOOLS. MAX 8 TABLETS IN 24 HOURS.   estradiol  (ESTRACE ) 0.1 MG/GM vaginal cream Place 1/2 gram vaginally at hs two to three times per week as needed.   fluorouracil (EFUDEX) 5 % cream Apply 1 Application topically daily as needed (skin cancer issue).   hydrochlorothiazide (HYDRODIURIL) 25 MG tablet TAKE ONE TABLET BY MOUTH ONCE DAILY AS NEEDED. DO NOT EXCEED 3 PER WEEK. APPT REQUIRED FOR FUTURE REFILLS   HYDROcodone -acetaminophen  (NORCO/VICODIN) 5-325 MG tablet Take 1 tablet by mouth every 4 (four) hours as needed for moderate pain (pain score 4-6).   HYDROcodone -acetaminophen  (NORCO/VICODIN) 5-325 MG tablet Take 1 tablet by mouth every 4 (four) hours as needed for moderate pain (pain score 4-6).   ibuprofen  (ADVIL ) 800 MG tablet Take 1 tablet (800 mg total) by mouth every 8 (eight) hours as needed.   pantoprazole (PROTONIX) 40 MG tablet Take 40  mg by mouth daily as needed (acid reflux).   Probiotic, Lactobacillus, CAPS Take 1 capsule by mouth daily.   Semaglutide,0.25 or 0.5MG /DOS, (OZEMPIC, 0.25 OR 0.5 MG/DOSE,) 2 MG/1.5ML SOPN Inject 0.5 mg into the skin once a week.   tretinoin (RETIN-A) 0.05 % cream Apply 1 Application topically 3 (three) times a week.   TRULANCE  3 MG TABS TAKE 1 TABLET BY MOUTH DAILY   zolpidem (AMBIEN) 10 MG tablet Take 10 mg by mouth at bedtime.   No current facility-administered medications for this visit. (Other)   REVIEW OF SYSTEMS: ROS   Positive for: Constitutional, Gastrointestinal, Eyes Negative  for: Neurological, Skin, Genitourinary, Musculoskeletal, HENT, Endocrine, Cardiovascular, Respiratory, Psychiatric, Allergic/Imm, Heme/Lymph Last edited by Olene Berne, COT on 08/16/2023  8:55 AM.     ALLERGIES Allergies  Allergen Reactions   Amoxicillin-Pot Clavulanate Diarrhea   Codeine Nausea Only   PAST MEDICAL HISTORY Past Medical History:  Diagnosis Date   Allergy    Anxiety    Arthritis    fingers   ASCUS with positive high risk human papillomavirus of vagina dx 10/11/15   Cancer (HCC)    basal cell skin   Hepatitis    age 41 , unknown type   History of vulvar dysplasia    VIN 2   Hypertension    IBS (irritable bowel syndrome)    Osteopenia 2018   Rectocele    Urethral pain    mass   Urinary incontinence    VAIN II (vaginal intraepithelial neoplasia grade II) dx 08/2015   right and left labia minora. This is really VIN II and not VAIN II.    Wears contact lenses    Past Surgical History:  Procedure Laterality Date   ABDOMINAL SACROCOLPOPEXY N/A 03/28/2016   Procedure: ABDOMINO SACROCOLPOPEXY;  Surgeon: Greta Leatherwood, MD;  Location: [facility];  Service: Gynecology;  Laterality: N/A;   ANAL RECTAL MANOMETRY N/A 08/09/2022   Procedure: ANO RECTAL MANOMETRY;  Surgeon: Tobin Forts, MD;  Location: [facility];  Service: Gastroenterology;  Laterality: N/A;   ANTERIOR AND POSTERIOR REPAIR N/A 03/28/2016   Procedure: ANTERIOR (CYSTOCELE) AND POSTERIOR REPAIR (RECTOCELE);  Surgeon: Greta Leatherwood, MD;  Location: [facility];  Service: Gynecology;  Laterality: N/A;   AUGMENTATION MAMMAPLASTY  APRIL 2013   BREAST IMPLANT CHANGED   BILATERAL SALPINGECTOMY Bilateral 03/28/2016   Procedure: BILATERAL SALPINGECTOMY;  Surgeon: Greta Leatherwood, MD;  Location: [facility];  Service: Gynecology;  Laterality: Bilateral;   BLADDER SUSPENSION N/A 03/28/2016   Procedure: TRANSVAGINAL TAPE (TVT) PROCEDURE exact midurethral sling;  Surgeon: Greta Leatherwood, MD;  Location: [facility];  Service: Gynecology;  Laterality: N/A;   CATARACT EXTRACTION W/PHACO Right 08/10/2023   Procedure: PHACOEMULSIFICATION, CATARACT;  Surgeon: Ardeth Krabbe, MD;  Location: [facility];  Service: Ophthalmology;  Laterality: Right;  CDE: 6.32   COLONOSCOPY     CYSTOSCOPY N/A 03/28/2016   Procedure: CYSTOSCOPY;  Surgeon: Greta Leatherwood, MD;  Location: [facility];  Service: Gynecology;  Laterality: N/A;   CYSTOSCOPY WITH BIOPSY Bilateral 11/03/2015   Procedure: CYSTOSCOPY WITH URETHRAL BIOPSY, FULGERATION, BILATERAL RETROGRADE PYELOGRAMS;  Surgeon: Osborn Blaze, MD;  Location: [facility];  Service: Urology;  Laterality: Bilateral;   GAS INSERTION Right 03/22/2023   Procedure: INSERTION OF GAS;  Surgeon: [name], MD;  Location: [facility] OR;  Service: Ophthalmology;  Laterality: Right;   INSERTION, STENT, DRUG-ELUTING, LACRIMAL CANALICULUS Right 08/10/2023  Procedure: INSERTION, STENT, DRUG-ELUTING, LACRIMAL CANALICULUS;  Surgeon: Ardeth Krabbe, MD;  Location: [facility];  Service: Ophthalmology;  Laterality: Right;   LYSIS OF ADHESION N/A 03/28/2016   Procedure: LYSIS OF ADHESION;  Surgeon: Greta Leatherwood, MD;  Location: [facility];  Service: Gynecology;  Laterality: N/A;   MASS EXCISION N/A 11/03/2015   Procedure: EXCISION  URETHRAL MASS;  Surgeon: Osborn Blaze, MD;  [facility];  Service: Urology;  Laterality: N/A;   SCLERAL BUCKLE Right 03/22/2023   Procedure: SCLERAL BUCKLE;  Surgeon: [name], MD;  Location: [facility] OR;  Service: Ophthalmology;  Laterality: Right;   TUBAL LIGATION     VAGINAL HYSTERECTOMY  1999   VITRECTOMY 25 GAUGE WITH SCLERAL BUCKLE Right 03/22/2023   Procedure: VITRECTOMY 25 GAUGE;  Surgeon: [name], MD;  Location: [facility];  Service: Ophthalmology;  Laterality: Right;   FAMILY HISTORY Family History  Problem Relation Age of Onset   Hypertension Mother    Osteoporosis Mother    Paget's  disease of bone Mother    Heart failure Mother    Cancer Father        MELANOMA   Diabetes Father    Hypertension Father    Heart disease Father    Cancer Sister        THYROID    Breast cancer Paternal Grandmother    Colon cancer Neg Hx    Pancreatic cancer Neg Hx    Esophageal cancer Neg Hx    SOCIAL HISTORY Social History   Tobacco Use   Smoking status: Never   Smokeless tobacco: Never  Vaping Use   Vaping status: Never Used  Substance Use Topics   Alcohol use: Not Currently   Drug use: No       OPHTHALMIC EXAM:  Base Eye Exam     Visual Acuity (Snellen - Linear)       Right Left   Dist [place] HM 20/30   Dist ph [place] NI 20/25         Tonometry (Tonopen, 9:03 AM)       Right Left   Pressure 22 10         Pupils       Dark Light Shape React APD   Right 5 4 Round Sluggish None   Left 4 3 Round Brisk None         Visual Fields       Left Right    Full Full         Extraocular Movement       Right Left    Full, Ortho Full, Ortho         Neuro/Psych     Oriented x3: Yes   Mood/Affect: Normal         Dilation     Right eye: 1.0% Mydriacyl , 2.5% Phenylephrine  @ 8:57 AM           Slit Lamp and Fundus Exam     Slit Lamp Exam       Right Left   Lids/Lashes Dermatochalasis - upper lid Dermatochalasis - upper lid, mild MGD   Conjunctiva/Sclera White and quiet White and quiet   Cornea arcus; trace Punctate epithelial erosions, well healed cataract wound 1+ Punctate epithelial erosions   Anterior Chamber Deep, cortical remnants, 4+cell deep and clear   Iris Round and dilated Round and dilated   Lens  2-3+ Nuclear sclerosis, 2-3+ Cortical cataract   Anterior Vitreous post vitrectomy, gas bubble gone  syneresis, fine pigment, Posterior vitreous detachment, vitreous condensations         Fundus Exam       Right Left   Disc No view Pink and Sharp, Compact   C/D Ratio 0.2 0.1   Macula No view Flat, Blunted foveal reflex, RPE  mottling and clumping, No heme or edema   Vessels no view attenuated, Tortuous   Periphery No view, +red reflex Attached, No heme, mild paving stone degeneration inferiorly, mild reticular degeneration           Refraction     Manifest Refraction   Unable to improve with refraction+/- 10.00-12.00          IMAGING AND PROCEDURES  Imaging and Procedures for 08/16/2023  OCT, Retina - OU - Both Eyes       Right Eye Quality was poor.   Left Eye Quality was good. Central Foveal Thickness: 278. Progression has been stable. Findings include normal foveal contour, no IRF, no SRF (Partial PVD).   Notes *Images captured and stored on drive  Diagnosis / Impression:  OD: no images obtained OS: NFP; no IRF/SRF   Clinical management:  See below  Abbreviations: NFP - Normal foveal profile. CME - cystoid macular edema. PED - pigment epithelial detachment. IRF - intraretinal fluid. SRF - subretinal fluid. EZ - ellipsoid zone. ERM - epiretinal membrane. ORA - outer retinal atrophy. ORT - outer retinal tubulation. SRHM - subretinal hyper-reflective material. IRHM - intraretinal hyper-reflective material      B-Scan Ultrasound - OD - Right Eye       Quality was good. Findings included vitreous opacities.   Notes **Images stored on drive**  Impression: OD: fine vitreous opacities and larger vit opacity settled inferiorly consistent with retained lens fragment; thickening of macula consistent with known history of ERM; ?CME          ASSESSMENT/PLAN:   ICD-10-CM   1. Right retinal detachment  H33.21     2. Epiretinal membrane (ERM) of right eye  H35.371 OCT, Retina - OU - Both Eyes    B-Scan Ultrasound - OD - Right Eye    3. Essential hypertension  I10     4. Hypertensive retinopathy of both eyes  H35.033     5. Combined forms of age-related cataract of left eye  H25.812     6. Retained lens material following cataract surgery, right eye  H59.021 B-Scan Ultrasound - OD  - Right Eye     6. Retained lens material OD  - Pt underwent phaco on 04.25.25 - complicated by posterior capsule rupture and dislocation of lens into the vitreous  - pt left aphakic  - currently on OmniPred  QID OD, PredForte QID OD, Timolol  BID OD, Brim TID OD, and 500mg  po diamox  BID.  - BCVA OD HM  - IOP 22  - exam shows cortical remnants in AC, no view of posterior pole  - B-scan ultrasound 05.01.25 shows retina attached, retained lens material settled inferiorly, and +ERM with thickening in macula -- ?CME component  - recommend:   Cont OmniPred  QID OD, Timolol  BID OD, Brim TID OD and 500 mg po diamox  BID   Increase PredForte to q1h OD while awake  - recommend 25g PPV/PPL to retained lens material +/- ERM peel OD under general anesthesia  - pt wishes to proceed with surgery next week - RBA of procedure discussed, questions answered - informed consent obtained and signed - case scheduled for Thursday, 5.8.25, 1130 am -- [facility]  OR 08 - f/u Monday for IOP and AC check  2. 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 Epiretinal membrane, right eye  - original exam showed dense ERM with central thickening and pre retinal fibrosis - discussed possible TissueBlue-assisted ERM peel along with lensectomy next week (see above)  4,5. Hypertensive retinopathy OU - discussed importance of tight BP control - monitor  6. Mixed Cataract OS - The symptoms of cataract, surgical options, and treatments and risks were discussed with patient. - discussed diagnosis and progression - monitor  Ophthalmic Meds Ordered this visit:  No orders of the  defined types were placed in this encounter.    Return in about 4 days (around 08/20/2023) for f/u retained lens material OD, DFE, OCT.  There are no Patient Instructions on file for this visit.  Explained the diagnoses, plan, and follow up with the patient and they expressed understanding.  Patient expressed understanding of the importance of proper follow up care.   This document serves as a record of services personally performed by Jeanice Millard, MD, PhD. It was created on their behalf by Morley Arabia. Bevin Bucks, OA an ophthalmic technician. The creation of this record is the provider's dictation and/or activities during the visit.    Electronically signed by: Morley Arabia. Bevin Bucks, OA 08/17/23 1:50 AM  Jeanice Millard, M.D., Ph.D. Diseases & Surgery of the Retina and Vitreous [facility] 08/16/2023  I have reviewed the above documentation for accuracy and completeness, and I agree with the above. Jeanice Millard, M.D., Ph.D. 08/17/23 2:08 AM   Abbreviations: M myopia (nearsighted); A astigmatism; H hyperopia (farsighted); P presbyopia; Mrx spectacle prescription;  CTL contact lenses; OD right eye; OS left eye; OU both eyes  XT exotropia; ET esotropia; PEK punctate epithelial keratitis; PEE punctate epithelial erosions; DES dry eye syndrome; MGD meibomian gland dysfunction; ATs artificial tears; PFAT's preservative free artificial tears; NSC nuclear sclerotic cataract; PSC posterior subcapsular cataract; ERM epi-retinal membrane; PVD posterior vitreous detachment; RD retinal detachment; DM diabetes mellitus; DR diabetic retinopathy; NPDR non-proliferative diabetic retinopathy; PDR proliferative diabetic retinopathy; CSME clinically significant macular edema; DME diabetic macular edema; dbh dot blot hemorrhages; CWS cotton wool spot; POAG primary open angle glaucoma; C/D cup-to-disc ratio; HVF humphrey visual field; GVF goldmann visual field; OCT optical coherence tomography; IOP  intraocular pressure; BRVO Branch retinal vein occlusion; CRVO central retinal vein occlusion; CRAO central retinal artery occlusion; BRAO branch retinal artery occlusion; RT retinal tear; SB scleral buckle; PPV pars plana vitrectomy; VH Vitreous hemorrhage; PRP panretinal laser photocoagulation; IVK intravitreal kenalog ; VMT vitreomacular traction; MH Macular hole;  NVD neovascularization of the disc; NVE neovascularization elsewhere; AREDS age related eye disease study; ARMD age related macular degeneration; POAG primary open angle glaucoma; EBMD epithelial/anterior basement membrane dystrophy; ACIOL anterior chamber intraocular lens; IOL intraocular lens; PCIOL posterior chamber intraocular lens; Phaco/IOL phacoemulsification with intraocular lens placement; PRK photorefractive keratectomy; LASIK laser assisted in situ keratomileusis; HTN hypertension; DM diabetes mellitus; COPD chronic obstructive  pulmonary disease

## 2023-08-20 ENCOUNTER — Ambulatory Visit (INDEPENDENT_AMBULATORY_CARE_PROVIDER_SITE_OTHER): Admitting: Ophthalmology

## 2023-08-20 DIAGNOSIS — H35033 Hypertensive retinopathy, bilateral: Secondary | ICD-10-CM

## 2023-08-20 DIAGNOSIS — H3321 Serous retinal detachment, right eye: Secondary | ICD-10-CM

## 2023-08-20 DIAGNOSIS — I10 Essential (primary) hypertension: Secondary | ICD-10-CM | POA: Diagnosis not present

## 2023-08-20 DIAGNOSIS — H25812 Combined forms of age-related cataract, left eye: Secondary | ICD-10-CM

## 2023-08-20 DIAGNOSIS — H59021 Cataract (lens) fragments in eye following cataract surgery, right eye: Secondary | ICD-10-CM

## 2023-08-20 DIAGNOSIS — H35371 Puckering of macula, right eye: Secondary | ICD-10-CM | POA: Diagnosis not present

## 2023-08-20 NOTE — Progress Notes (Signed)
 PCP - Kathyleen Parkins, MD  Cardiologist - ***  PPM/ICD - *** Device Orders - *** Rep Notified - ***  Chest x-ray - 02-16-21 EKG - 03-22-23 Stress Test - *** ECHO - *** Cardiac Cath - ***  CPAP - ***  GLP-1 -  Fasting Blood Sugar - *** Checks Blood Sugar ***/day  Blood Thinner Instructions: *** Aspirin Instructions: ***  ERAS Protcol - ***  COVID TEST- ***  Anesthesia review: ***  Patient verbally denies any shortness of breath, fever, cough and chest pain during phone call   -------------  SDW INSTRUCTIONS given:  Your procedure is scheduled on Aug 23, 2023.  Report to [facility] Main Entrance "A" at 9:00 A.M., and check in at the Admitting office.  Call this number if you have problems the morning of surgery:  [phone]   Remember:  Do not eat after midnight the night before your surgery  You may drink clear liquids until 8:30 the morning of your surgery.   Clear liquids allowed are: Water , Non-Citrus Juices (without pulp), Carbonated Beverages, Clear Tea, Black Coffee Only, and Gatorade    Take these medicines the morning of surgery with A SIP OF WATER   ALPRAZolam  (XANAX )     As of today, STOP taking any Aspirin (unless otherwise instructed by your surgeon) Aleve, Naproxen, Ibuprofen , Motrin , Advil , Goody's, BC's, all herbal medications, fish oil, and all vitamins.                      Do not wear jewelry, make up, or nail polish            Do not wear lotions, powders, perfumes/colognes, or deodorant.            Do not shave 48 hours prior to surgery.  Men may shave face and neck.            Do not bring valuables to the hospital.            [facility] is not responsible for any belongings or valuables.  Do NOT Smoke (Tobacco/Vaping) 24 hours prior to your procedure If you use a CPAP at night, you may bring all equipment for your overnight stay.   Contacts, glasses, dentures or bridgework may not be worn into surgery.      For patients admitted to  the hospital, discharge time will be determined by your treatment team.   Patients discharged the day of surgery will not be allowed to drive home, and someone needs to stay with them for 24 hours.    Special instructions:   [facility]- Preparing For Surgery  Before surgery, you can play an important role. Because skin is not sterile, your skin needs to be as free of germs as possible. You can reduce the number of germs on your skin by washing with CHG (chlorahexidine gluconate) Soap before surgery.  CHG is an antiseptic cleaner which kills germs and bonds with the skin to continue killing germs even after washing.    Oral Hygiene is also important to reduce your risk of infection.  Remember - BRUSH YOUR TEETH THE MORNING OF SURGERY WITH YOUR REGULAR TOOTHPASTE  Please do not use if you have an allergy to CHG or antibacterial soaps. If your skin becomes reddened/irritated stop using the CHG.  Do not shave (including legs and underarms) for at least 48 hours prior to first CHG shower. It is OK to shave your face.  Please follow these instructions carefully.  Shower the NIGHT BEFORE SURGERY and the MORNING OF SURGERY with DIAL Soap.   Pat yourself dry with a CLEAN TOWEL.  Wear CLEAN PAJAMAS to bed the night before surgery  Place CLEAN SHEETS on your bed the night of your first shower and DO NOT SLEEP WITH PETS.   Day of Surgery: Please shower morning of surgery  Wear Clean/Comfortable clothing the morning of surgery Do not apply any deodorants/lotions.   Remember to brush your teeth WITH YOUR REGULAR TOOTHPASTE.   Questions were answered. Patient verbalized understanding of instructions.

## 2023-08-20 NOTE — Progress Notes (Signed)
 [facility] - Clinic Note  08/20/2023   CHIEF COMPLAINT Patient presents for Retina Follow Up  HISTORY OF PRESENT ILLNESS: Stephanie Armstrong is a 68 y.o. female who presents to the clinic today for:  HPI     Retina Follow Up   Patient presents with  Retinal Break/Detachment.  In right eye.  This started 4 months ago.  Duration of 4 months.  Since onset it is stable.  I, the attending physician,  performed the HPI with the patient and updated documentation appropriately.        Comments   Pt presents for pre surgical check for OD fragment lens material. Pt has been consistent with Timolol  BID OD, Brim TID OD and 500 mg po diamox  BID and has increased PredForte to q1h OD while awake. Pt denies any discomfort but her eye does feel a little more dry than usual. Pt states when she looks up to put in her drops she sees a brown mosaic circle in her OD vision, possibly debris. Pt denies andy FOL.        Last edited by [name], MD on 08/21/2023  8:32 AM.    Patient states she is keeping up with all her eye drops and she is still taking diamox , she states she feels "really depleted" on the medication, she denies new fol  Referring physician: Kathyleen Parkins, MD [address] [place],  [place] [address]  HISTORICAL INFORMATION:  Selected notes from the MEDICAL RECORD NUMBER Referred by Dr. Barbra Boone for concern of macular hole OD LEE:  Ocular Hx- PMH-   CURRENT MEDICATIONS: Current Outpatient Medications (Ophthalmic Drugs)  Medication Sig   brimonidine  (ALPHAGAN ) 0.2 % ophthalmic solution Place 1 drop into the right eye in the morning and at bedtime.   prednisoLONE  acetate (PRED FORTE ) 1 % ophthalmic suspension Place 1 drop into both eyes 4 (four) times daily. (Patient taking differently: Place 1 drop into the right eye See admin instructions. 1 drop in right eye every hour)   timolol  (TIMOPTIC ) 0.5 % ophthalmic solution Place 1 drop into the right eye 2 (two)  times daily.   No current facility-administered medications for this visit. (Ophthalmic Drugs)   Current Outpatient Medications (Other)  Medication Sig   acetaZOLAMIDE  ER (DIAMOX ) 500 MG capsule Take 500 mg by mouth 2 (two) times daily.   ALPRAZolam  (XANAX ) 0.5 MG tablet Take 1 tab p.o. qhs for 2 weeks, then take 1/2 tab p.o. qhs, then take 1/2 tab qod, then discontinue. (Patient taking differently: Take 0.25 mg by mouth 2 (two) times daily as needed for anxiety.)   amLODipine-benazepril (LOTREL) 5-40 MG capsule TAKE (1) CAPSULE BY MOUTH ONCE DAILY.   Cholecalciferol (VITAMIN D3) 125 MCG (5000 UT) CAPS Take 5,000 Units by mouth daily.   diphenoxylate-atropine  (LOMOTIL) 2.5-0.025 MG tablet TAKE 2 TABLETS BY MOUTH 4 TIMES DAILY AS NEEDED FOR DIARRHEA OR LOOSE STOOLS. MAX 8 TABLETS IN 24 HOURS.   estradiol  (ESTRACE ) 0.1 MG/GM vaginal cream Place 1/2 gram vaginally at hs two to three times per week as needed. (Patient taking differently: Place 0.5 Applicatorfuls vaginally 2 (two) times a week.)   fluorouracil (EFUDEX) 5 % cream Apply 1 Application topically daily as needed (skin cancer issue).   hydrochlorothiazide (HYDRODIURIL) 25 MG tablet TAKE ONE TABLET BY MOUTH ONCE DAILY AS NEEDED. DO NOT EXCEED 3 PER WEEK. APPT REQUIRED FOR FUTURE REFILLS (Patient taking differently: Take 25 mg by mouth 3 (three) times a week.  DO NOT  EXCEED 3 PER WEEK. APPT REQUIRED FOR FUTURE REFILLS)   ibuprofen  (ADVIL ) 800 MG tablet Take 1 tablet (800 mg total) by mouth every 8 (eight) hours as needed.   pantoprazole (PROTONIX) 40 MG tablet Take 40 mg by mouth daily as needed (acid reflux).   Probiotic, Lactobacillus, CAPS Take 1 capsule by mouth daily.   Semaglutide,0.25 or 0.5MG /DOS, (OZEMPIC, 0.25 OR 0.5 MG/DOSE,) 2 MG/1.5ML SOPN Inject 0.5 mg into the skin once a week.   tretinoin (RETIN-A) 0.05 % cream Apply 1 Application topically 3 (three) times a week.   TRULANCE  3 MG TABS TAKE 1 TABLET BY MOUTH DAILY (Patient  taking differently: Take 3 mg by mouth 2 (two) times a week.)   zolpidem (AMBIEN) 10 MG tablet Take 10 mg by mouth at bedtime.   No current facility-administered medications for this visit. (Other)   REVIEW OF SYSTEMS: ROS   Positive for: Constitutional, Gastrointestinal, Eyes Negative for: Neurological, Skin, Genitourinary, Musculoskeletal, HENT, Endocrine, Cardiovascular, Respiratory, Psychiatric, Allergic/Imm, Heme/Lymph Last edited by Carrington Clack, COT on 08/20/2023  9:55 AM.     ALLERGIES Allergies  Allergen Reactions   Augmentin [Amoxicillin-Pot Clavulanate] Diarrhea   Codeine Nausea Only   PAST MEDICAL HISTORY Past Medical History:  Diagnosis Date   Allergy    Anxiety    Arthritis    fingers   ASCUS with positive high risk human papillomavirus of vagina dx 10/11/15   Cancer (HCC)    basal cell skin   Hepatitis    age 47 , unknown type   History of vulvar dysplasia    VIN 2   Hypertension    IBS (irritable bowel syndrome)    Osteopenia 2018   Rectocele    Urethral pain    mass   Urinary incontinence    VAIN II (vaginal intraepithelial neoplasia grade II) dx 08/2015   right and left labia minora. This is really VIN II and not VAIN II.    Wears contact lenses    Past Surgical History:  Procedure Laterality Date   ABDOMINAL SACROCOLPOPEXY N/A 03/28/2016   Procedure: ABDOMINO SACROCOLPOPEXY;  Surgeon: Greta Leatherwood, MD;  Location: [facility];  Service: Gynecology;  Laterality: N/A;   ANAL RECTAL MANOMETRY N/A 08/09/2022   Procedure: ANO RECTAL MANOMETRY;  Surgeon: Tobin Forts, MD;  Location: [facility];  Service: Gastroenterology;  Laterality: N/A;   ANTERIOR AND POSTERIOR REPAIR N/A 03/28/2016   Procedure: ANTERIOR (CYSTOCELE) AND POSTERIOR REPAIR (RECTOCELE);  Surgeon: Greta Leatherwood, MD;  Location: [facility];  Service: Gynecology;  Laterality: N/A;   AUGMENTATION MAMMAPLASTY  APRIL 2013   BREAST IMPLANT CHANGED   BILATERAL SALPINGECTOMY  Bilateral 03/28/2016   Procedure: BILATERAL SALPINGECTOMY;  Surgeon: Greta Leatherwood, MD;  Location: [facility];  Service: Gynecology;  Laterality: Bilateral;   BLADDER SUSPENSION N/A 03/28/2016   Procedure: TRANSVAGINAL TAPE (TVT) PROCEDURE exact midurethral sling;  Surgeon: Greta Leatherwood, MD;  Location: [facility];  Service: Gynecology;  Laterality: N/A;   CATARACT EXTRACTION W/PHACO Right 08/10/2023   Procedure: PHACOEMULSIFICATION, CATARACT;  Surgeon: Ardeth Krabbe, MD;  Location: [facility];  Service: Ophthalmology;  Laterality: Right;  CDE: 6.32   COLONOSCOPY     CYSTOSCOPY N/A 03/28/2016   Procedure: CYSTOSCOPY;  Surgeon: Greta Leatherwood, MD;  Location: [facility];  Service: Gynecology;  Laterality: N/A;   CYSTOSCOPY WITH BIOPSY Bilateral 11/03/2015   Procedure: CYSTOSCOPY WITH URETHRAL BIOPSY, FULGERATION, BILATERAL RETROGRADE PYELOGRAMS;  Surgeon: Osborn Blaze, MD;  Location: [facility];  Service: Urology;  Laterality: Bilateral;   GAS INSERTION Right 03/22/2023   Procedure: INSERTION OF GAS;  Surgeon: [name], MD;  Location: [facility] OR;  Service: Ophthalmology;  Laterality: Right;   INSERTION, STENT, DRUG-ELUTING, LACRIMAL CANALICULUS Right 08/10/2023   Procedure: INSERTION, STENT, DRUG-ELUTING, LACRIMAL CANALICULUS;  Surgeon: Ardeth Krabbe, MD;  Location: [facility];  Service: Ophthalmology;  Laterality: Right;   LYSIS OF ADHESION N/A 03/28/2016   Procedure: LYSIS OF ADHESION;  Surgeon: Greta Leatherwood, MD;  Location: [facility];  Service: Gynecology;  Laterality: N/A;   MASS EXCISION N/A 11/03/2015   Procedure: EXCISION  URETHRAL MASS;  Surgeon: Osborn Blaze, MD;  Location: [facility];  Service: Urology;  Laterality: N/A;   SCLERAL BUCKLE Right 03/22/2023   Procedure: SCLERAL BUCKLE;  Surgeon: [name], MD;  Location: [facility] OR;  Service: Ophthalmology;  Laterality: Right;   TUBAL LIGATION     VAGINAL HYSTERECTOMY  1999    VITRECTOMY 25 GAUGE WITH SCLERAL BUCKLE Right 03/22/2023   Procedure: VITRECTOMY 25 GAUGE;  Surgeon: [name], MD;  Location: [facility] OR;  Service: Ophthalmology;  Laterality: Right;   FAMILY HISTORY Family History  Problem Relation Age of Onset   Hypertension Mother    Osteoporosis Mother    Paget's disease of bone Mother    Heart failure Mother    Cancer Father        MELANOMA   Diabetes Father    Hypertension Father    Heart disease Father    Cancer Sister        THYROID    Breast cancer Paternal Grandmother    Colon cancer Neg Hx    Pancreatic cancer Neg Hx    Esophageal cancer Neg Hx    SOCIAL HISTORY Social History   Tobacco Use   Smoking status: Never   Smokeless tobacco: Never  Vaping Use   Vaping status: Never Used  Substance Use Topics   Alcohol use: Not Currently   Drug use: No       OPHTHALMIC EXAM:  Base Eye Exam     Visual Acuity (Snellen - Linear)       Right Left   Dist [place] HM 20/20   Dist ph [place] NI          Tonometry (Tonopen, 10:04 AM)       Right Left   Pressure 23 10         Pupils       Dark Light Shape React APD   Right 4 4 Irregular Minimal None   Left 3 2 Round Brisk None         Visual Fields       Left Right    Full Full         Extraocular Movement       Right Left    Full, Ortho Full, Ortho         Neuro/Psych     Oriented x3: Yes   Mood/Affect: Normal         Dilation     Right eye: 1.0% Mydriacyl , 2.5% Phenylephrine  @ 10:05 AM           Slit Lamp and Fundus Exam     Slit Lamp Exam       Right Left   Lids/Lashes Dermatochalasis - upper lid Dermatochalasis - upper lid, mild MGD   Conjunctiva/Sclera White and quiet White and quiet  Cornea arcus; trace Punctate epithelial erosions, well healed cataract wound 1+ Punctate epithelial erosions   Anterior Chamber Deep, cortical remnants, 4+cell deep and clear   Iris Round and dilated Round and dilated   Lens  2-3+ Nuclear sclerosis, 2-3+  Cortical cataract   Anterior Vitreous post vitrectomy, gas bubble gone syneresis, fine pigment, Posterior vitreous detachment, vitreous condensations         Fundus Exam       Right Left   Disc No view Pink and Sharp, Compact   C/D Ratio 0.2 0.1   Macula No view Flat, Blunted foveal reflex, RPE mottling and clumping, No heme or edema   Vessels no view attenuated, Tortuous   Periphery No view, +red reflex Attached, No heme, mild paving stone degeneration inferiorly, mild reticular degeneration           Refraction     Wearing Rx       Sphere Cylinder Axis Add   Right +1.50 +0.50 167 +2.25   Left +1.75 +0.50 009 +2.25    Type: Progressive           IMAGING AND PROCEDURES  Imaging and Procedures for 08/20/2023         ASSESSMENT/PLAN:   ICD-10-CM   1. Retained lens material following cataract surgery, right eye  H59.021     2. Right retinal detachment  H33.21     3. Epiretinal membrane (ERM) of right eye  H35.371     4. Essential hypertension  I10     5. Hypertensive retinopathy of both eyes  H35.033     6. Combined forms of age-related cataract of left eye  H25.812      1. Retained lens material OD  - Pt underwent phaco on 04.25.25 - complicated by posterior capsule rupture and dislocation of lens into the vitreous  - pt left aphakic  - currently on OmniPred  QID OD, PredForte q1h while awake OD, Timolol  BID OD, Brim TID OD, and 500mg  po diamox  BID.  - BCVA OD HM -- stable  - IOP 23  - exam shows persistent cortical remnants in AC, no view of posterior pole  - B-scan ultrasound 05.01.25 shows retina attached, retained lens material settled inferiorly, and +ERM with thickening in macula -- ?CME component  - Cont OmniPred  QID OD, Timolol  BID OD, Brim TID OD and 500 mg po diamox  BID  - cont PredForte q1h OD while awake  - recommend 25g PPV/PPL to retained lens material +/- ERM peel OD under general anesthesia  - pt wishes to proceed with surgery on Thursday  as scheduled - RBA of procedure discussed, questions answered - informed consent obtained and signed - case scheduled for Thursday, 5.8.25, 1130 am -- [facility] OR 08 - f/u Friday, POD 1, DFE OD only, no OCT  2. 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 Epiretinal membrane, right eye  - original exam showed dense ERM with central thickening and pre retinal fibrosis - discussed possible TissueBlue-assisted ERM peel  along with lensectomy (see above)  4,5. Hypertensive retinopathy OU - discussed importance of tight BP control - monitor  6. Mixed Cataract OS - The symptoms of cataract, surgical options, and treatments and risks were discussed with patient. - discussed diagnosis and progression - monitor  Ophthalmic Meds Ordered this visit:  No orders of the defined types were placed in this encounter.    Return in about 4 days (around 08/24/2023) for f/u Friday as scheduled, no OCT, DFE OD only.  There are no Patient Instructions on file for this visit.  Explained the diagnoses, plan, and follow up with the patient and they expressed understanding.  Patient expressed understanding of the importance of proper follow up care.   This document serves as a record of services personally performed by Jeanice Millard, MD, PhD. It was created on their behalf by Morley Arabia. Bevin Bucks, OA an ophthalmic technician. The creation of this record is the provider's dictation and/or activities during the visit.    Electronically signed by: Morley Arabia. Bevin Bucks, OA 08/21/23 8:33 AM  Jeanice Millard, M.D., Ph.D. Diseases & Surgery of the Retina and Vitreous [facility] 08/20/2023  I have reviewed the above documentation for accuracy and completeness, and I agree with the above. Jeanice Millard, M.D., Ph.D. 08/21/23 8:35 AM   Abbreviations: M myopia (nearsighted); A astigmatism; H hyperopia (farsighted); P presbyopia; Mrx spectacle prescription;  CTL contact lenses; OD right eye; OS left eye; OU both eyes  XT exotropia; ET esotropia; PEK punctate epithelial keratitis; PEE punctate epithelial erosions; DES dry eye syndrome; MGD meibomian gland dysfunction; ATs artificial tears; PFAT's preservative free artificial tears; NSC nuclear sclerotic cataract; PSC posterior subcapsular cataract; ERM epi-retinal membrane; PVD posterior vitreous detachment; RD retinal detachment; DM diabetes mellitus; DR diabetic retinopathy; NPDR non-proliferative diabetic retinopathy; PDR proliferative diabetic retinopathy; CSME clinically significant macular edema; DME diabetic macular edema; dbh dot blot hemorrhages; CWS cotton wool spot; POAG primary open angle glaucoma; C/D cup-to-disc ratio; HVF humphrey visual field; GVF goldmann visual field; OCT optical coherence tomography; IOP intraocular pressure; BRVO Branch retinal vein occlusion; CRVO central retinal vein occlusion; CRAO central retinal artery occlusion; BRAO branch retinal artery occlusion; RT retinal tear; SB scleral buckle; PPV pars plana vitrectomy; VH Vitreous hemorrhage; PRP panretinal laser photocoagulation; IVK intravitreal kenalog ; VMT vitreomacular traction; MH Macular hole;  NVD neovascularization of the disc; NVE neovascularization elsewhere; AREDS age related eye disease study; ARMD age related macular degeneration; POAG primary open angle glaucoma; EBMD epithelial/anterior basement membrane dystrophy; ACIOL anterior chamber intraocular lens; IOL intraocular lens; PCIOL posterior chamber intraocular lens; Phaco/IOL phacoemulsification with intraocular lens placement; PRK photorefractive keratectomy;  LASIK laser assisted in situ keratomileusis; HTN hypertension; DM diabetes mellitus; COPD chronic obstructive pulmonary disease

## 2023-08-21 ENCOUNTER — Encounter (HOSPITAL_COMMUNITY): Payer: Self-pay | Admitting: Ophthalmology

## 2023-08-21 ENCOUNTER — Encounter (INDEPENDENT_AMBULATORY_CARE_PROVIDER_SITE_OTHER): Payer: Self-pay | Admitting: Ophthalmology

## 2023-08-21 ENCOUNTER — Other Ambulatory Visit: Payer: Self-pay

## 2023-08-21 NOTE — Progress Notes (Signed)
 [facility] - Clinic Note  08/24/2023   CHIEF COMPLAINT Patient presents for Retina Follow Up  HISTORY OF PRESENT ILLNESS: Stephanie Armstrong is a 68 y.o. female who presents to the clinic today for:  HPI     Retina Follow Up   In right eye.  This started 1 day ago.  Duration of 1 day.  Since onset it is stable.  I, the attending physician,  performed the HPI with the patient and updated documentation appropriately.        Comments   Retina follow up POD 1, DFE OD Retained lens material OD pt is reporting she did not sleep well last night she denies any pain or discomfort       Last edited by [name], MD on 08/27/2023 12:02 AM.     Patient states she has not slept all night  Referring physician: Kathyleen Parkins, MD [address] [place],  [place] [address]  HISTORICAL INFORMATION:  Selected notes from the MEDICAL RECORD NUMBER Referred by Dr. Barbra Boone for concern of macular hole OD LEE:  Ocular Hx- PMH-   CURRENT MEDICATIONS: Current Outpatient Medications (Ophthalmic Drugs)  Medication Sig   erythromycin ophthalmic ointment Place 1 Application into the right eye 4 (four) times daily.   brimonidine  (ALPHAGAN ) 0.2 % ophthalmic solution Place 1 drop into the right eye in the morning and at bedtime.   prednisoLONE  acetate (PRED FORTE ) 1 % ophthalmic suspension Place 1 drop into both eyes 4 (four) times daily. (Patient taking differently: Place 1 drop into the right eye See admin instructions. 1 drop in right eye every hour)   timolol  (TIMOPTIC ) 0.5 % ophthalmic solution Place 1 drop into the right eye 2 (two) times daily.   No current facility-administered medications for this visit. (Ophthalmic Drugs)   Current Outpatient Medications (Other)  Medication Sig   acetaZOLAMIDE  ER (DIAMOX ) 500 MG capsule Take 500 mg by mouth 2 (two) times daily.   ALPRAZolam  (XANAX ) 0.5 MG tablet Take 1 tab p.o. qhs for 2 weeks, then take 1/2 tab p.o. qhs, then take  1/2 tab qod, then discontinue. (Patient taking differently: Take 0.25 mg by mouth 2 (two) times daily as needed for anxiety.)   amLODipine-benazepril (LOTREL) 5-40 MG capsule TAKE (1) CAPSULE BY MOUTH ONCE DAILY.   Cholecalciferol (VITAMIN D3) 125 MCG (5000 UT) CAPS Take 5,000 Units by mouth daily.   diphenoxylate-atropine  (LOMOTIL) 2.5-0.025 MG tablet TAKE 2 TABLETS BY MOUTH 4 TIMES DAILY AS NEEDED FOR DIARRHEA OR LOOSE STOOLS. MAX 8 TABLETS IN 24 HOURS.   estradiol  (ESTRACE ) 0.1 MG/GM vaginal cream Place 1/2 gram vaginally at hs two to three times per week as needed. (Patient taking differently: Place 0.5 Applicatorfuls vaginally 2 (two) times a week.)   fluorouracil (EFUDEX) 5 % cream Apply 1 Application topically daily as needed (skin cancer issue).   hydrochlorothiazide (HYDRODIURIL) 25 MG tablet TAKE ONE TABLET BY MOUTH ONCE DAILY AS NEEDED. DO NOT EXCEED 3 PER WEEK. APPT REQUIRED FOR FUTURE REFILLS (Patient taking differently: Take 25 mg by mouth 3 (three) times a week.  DO NOT EXCEED 3 PER WEEK. APPT REQUIRED FOR FUTURE REFILLS)   ibuprofen  (ADVIL ) 800 MG tablet Take 1 tablet (800 mg total) by mouth every 8 (eight) hours as needed.   pantoprazole (PROTONIX) 40 MG tablet Take 40 mg by mouth daily as needed (acid reflux).   Probiotic, Lactobacillus, CAPS Take 1 capsule by mouth daily.   Semaglutide,0.25 or 0.5MG /DOS, (OZEMPIC, 0.25 OR  0.5 MG/DOSE,) 2 MG/1.5ML SOPN Inject 0.5 mg into the skin once a week.   tretinoin (RETIN-A) 0.05 % cream Apply 1 Application topically 3 (three) times a week.   TRULANCE  3 MG TABS TAKE 1 TABLET BY MOUTH DAILY (Patient taking differently: Take 3 mg by mouth 2 (two) times a week.)   zolpidem (AMBIEN) 10 MG tablet Take 10 mg by mouth at bedtime.   No current facility-administered medications for this visit. (Other)   REVIEW OF SYSTEMS: ROS   Positive for: Constitutional, Gastrointestinal, Eyes Negative for: Neurological, Skin, Genitourinary, Musculoskeletal,  HENT, Endocrine, Cardiovascular, Respiratory, Psychiatric, Allergic/Imm, Heme/Lymph Last edited by Alise Appl, COT on 08/24/2023  7:57 AM.      ALLERGIES Allergies  Allergen Reactions   Augmentin [Amoxicillin-Pot Clavulanate] Diarrhea   Codeine Nausea Only   PAST MEDICAL HISTORY Past Medical History:  Diagnosis Date   Allergy    Anxiety    Arthritis    fingers   ASCUS with positive high risk human papillomavirus of vagina dx 10/11/15   Cancer (HCC)    basal cell skin   Hepatitis    age 76 , unknown type   History of vulvar dysplasia    VIN 2   Hypertension    IBS (irritable bowel syndrome)    Osteopenia 2018   Rectocele    Urethral pain    mass   Urinary incontinence    VAIN II (vaginal intraepithelial neoplasia grade II) dx 08/2015   right and left labia minora. This is really VIN II and not VAIN II.    Wears contact lenses    Past Surgical History:  Procedure Laterality Date   ABDOMINAL SACROCOLPOPEXY N/A 03/28/2016   Procedure: ABDOMINO SACROCOLPOPEXY;  Surgeon: Greta Leatherwood, MD;  Location: [facility];  Service: Gynecology;  Laterality: N/A;   ANAL RECTAL MANOMETRY N/A 08/09/2022   Procedure: ANO RECTAL MANOMETRY;  Surgeon: Tobin Forts, MD;  [facility];  Service: Gastroenterology;  Laterality: N/A;   ANTERIOR AND POSTERIOR REPAIR N/A 03/28/2016   Procedure: ANTERIOR (CYSTOCELE) AND POSTERIOR REPAIR (RECTOCELE);  Surgeon: Greta Leatherwood, MD;  Location: [facility];  Service: Gynecology;  Laterality: N/A;   AUGMENTATION MAMMAPLASTY  APRIL 2013   BREAST IMPLANT CHANGED   BILATERAL SALPINGECTOMY Bilateral 03/28/2016   Procedure: BILATERAL SALPINGECTOMY;  Surgeon: Greta Leatherwood, MD;  [facility]: [facility];  Service: Gynecology;  Laterality: Bilateral;   BLADDER SUSPENSION N/A 03/28/2016   Procedure: TRANSVAGINAL TAPE (TVT) PROCEDURE exact midurethral sling;  Surgeon: Greta Leatherwood, MD;  [facility];  Service:  Gynecology;  Laterality: N/A;   CATARACT EXTRACTION W/PHACO Right 08/10/2023   Procedure: PHACOEMULSIFICATION, CATARACT;  Surgeon: Ardeth Krabbe, MD;  Location: [facility];  Service: Ophthalmology;  Laterality: Right;  CDE: 6.32   COLONOSCOPY     CYSTOSCOPY N/A 03/28/2016   Procedure: CYSTOSCOPY;  Surgeon: Greta Leatherwood, MD;  [facility];  Service: Gynecology;  Laterality: N/A;   CYSTOSCOPY WITH BIOPSY Bilateral 11/03/2015   Procedure: CYSTOSCOPY WITH URETHRAL BIOPSY, FULGERATION, BILATERAL RETROGRADE PYELOGRAMS;  Surgeon: Osborn Blaze, MD;  Location: [facility];  Service: Urology;  Laterality: Bilateral;   GAS INSERTION Right 03/22/2023   Procedure: INSERTION OF GAS;  Surgeon: [name], MD;  Location: [facility] OR;  Service: Ophthalmology;  Laterality: Right;   INSERTION, STENT, DRUG-ELUTING, LACRIMAL CANALICULUS Right 08/10/2023   Procedure: INSERTION, STENT, DRUG-ELUTING, LACRIMAL CANALICULUS;  Surgeon: Ardeth Krabbe, MD;  [facility];  Service: Ophthalmology;  Laterality: Right;   LYSIS OF ADHESION N/A 03/28/2016   Procedure: LYSIS OF ADHESION;  Surgeon: Greta Leatherwood, MD;  [facility];  Service: Gynecology;  Laterality: N/A;   MASS EXCISION N/A 11/03/2015   Procedure: EXCISION  URETHRAL MASS;  Surgeon: Osborn Blaze, MD;  Location: [facility];  Service: Urology;  Laterality: N/A;   PARS PLANA VITRECTOMY W/REMOVE OF INTERNAL LIMITING MEMBRANE W/TAMPONADE Right 08/23/2023   Procedure: PARS PLANA VITRECTOMY WITH 25 GAUGE WITH REMOVAL OF INTERNAL LIMITING MEMBRANE OF RETINA WITH INTRAOCULAR TAMPONADE;  Surgeon: [name], MD;  Location: [facility] OR;  Service: Ophthalmology;  Laterality: Right;   REMOVAL RETAINED LENS Right 08/23/2023   Procedure: REMOVAL, RETAINED LENS MATTER;  Surgeon: [name], MD;  Location: [facility] OR;  Service: Ophthalmology;  Laterality: Right;   SCLERAL BUCKLE Right 03/22/2023   Procedure: SCLERAL BUCKLE;   Surgeon: [name], MD;  Location: [facility] OR;  Service: Ophthalmology;  Laterality: Right;   TUBAL LIGATION     VAGINAL HYSTERECTOMY  1999   VITRECTOMY 25 GAUGE WITH SCLERAL BUCKLE Right 03/22/2023   Procedure: VITRECTOMY 25 GAUGE;  Surgeon: [name], MD;  Location: [facility] OR;  Service: Ophthalmology;  Laterality: Right;   FAMILY HISTORY Family History  Problem Relation Age of Onset   Hypertension Mother    Osteoporosis Mother    Paget's disease of bone Mother    Heart failure Mother    Cancer Father        MELANOMA   Diabetes Father    Hypertension Father    Heart disease Father    Cancer Sister        THYROID    Breast cancer Paternal Grandmother    Colon cancer Neg Hx    Pancreatic cancer Neg Hx    Esophageal cancer Neg Hx    SOCIAL HISTORY Social History   Tobacco Use   Smoking status: Never   Smokeless tobacco: Never  Vaping Use   Vaping status: Never Used  Substance Use Topics   Alcohol use: Not Currently   Drug use: No       OPHTHALMIC EXAM:  Base Eye Exam     Visual Acuity (Snellen - Linear)       Right Left   Dist [place] HM 20/30   Dist ph [place] NI NI         Tonometry (Tonopen, 8:00 AM)       Right Left   Pressure 10 10         Pupils       Dark Light Shape React APD   Right 5 5 Round dilated None   Left 3 2 Round Brisk None         Visual Fields       Left Right    Full    Restrictions  Total superior temporal, inferior temporal, superior nasal, inferior nasal deficiencies         Extraocular Movement       Right Left    Full, Ortho Full, Ortho         Neuro/Psych     Oriented x3: Yes   Mood/Affect: Normal         Dilation     Right eye: 2.5% Phenylephrine  @ 8:01 AM           Slit Lamp and Fundus Exam     Slit Lamp Exam       Right Left  Lids/Lashes Dermatochalasis - upper lid, Meibomian gland dysfunction Dermatochalasis - upper lid, mild MGD   Conjunctiva/Sclera Subconjunctival hemorrhage, sutures  intact, 2+ Injection White and quiet   Cornea arcus; 2+Punctate epithelial erosions, well healed cataract wound, tear film debris, 1+ Descemet's folds 1+ Punctate epithelial erosions   Anterior Chamber Deep, 2+cell and pigment deep and clear   Iris dilated, defect at 0130 Round and dilated   Lens Aphakia 2-3+ Nuclear sclerosis, 2-3+ Cortical cataract   Anterior Vitreous post vitrectomy, mild cell and pigment syneresis, fine pigment, Posterior vitreous detachment, vitreous condensations         Fundus Exam       Right Left   Disc hazy view, mild Pallor, Sharp rim, Compact Pink and Sharp, Compact   C/D Ratio 0.2 0.1   Macula hazy view, Flat, Blunted foveal reflex, ERM improved, punctate heme IT mac Flat, Blunted foveal reflex, RPE mottling and clumping, No heme or edema   Vessels no view, attenuated, Tortuous attenuated, Tortuous   Periphery Attached over buckle, good buckle height, good laser over buckle and around tears; PRE:OP, Bullous temporal detachment from 0800-1030 with bi-lobed HST at 1030 Attached, No heme, mild paving stone degeneration inferiorly, mild reticular degeneration           Refraction     Wearing Rx       Sphere Cylinder Axis Add   Right +1.50 +0.50 167 +2.25   Left +1.75 +0.50 009 +2.25    Type: Progressive           IMAGING AND PROCEDURES  Imaging and Procedures for 08/24/2023         ASSESSMENT/PLAN:   ICD-10-CM   1. Retained lens material following cataract surgery, right eye  H59.021     2. Right retinal detachment  H33.21     3. Epiretinal membrane (ERM) of right eye  H35.371 CANCELED: OCT, Retina - OU - Both Eyes    4. Essential hypertension  I10     5. Hypertensive retinopathy of both eyes  H35.033     6. Combined forms of age-related cataract of left eye  H25.812     7. Combined forms of age-related cataract of both eyes  H25.813      1. Retained lens material OD  - Pt underwent phaco on 04.25.25 - complicated by posterior  capsule rupture and dislocation of lens into the vitreous  - pt left aphakic  - pre op BCVA OD HM  - now POD1 s/p PPV/removal of retained lens material/tissue blue/MP OD, 05.08.25             - doing well this morning  - retained lens material removed             - retina attached and in good position              - IOP 10             - start   PF 6x/day OD                          zymaxid  QID OD                          Atropine  BID OD                          PSO ung QID OD  --  pt reports burning with PSO -- can switch to erythromycin ung QID OD             - sleep with head elevated             - eye shield when sleeping              - post op drop and positioning instructions reviewed              - tylenol /ibuprofen  for pain              - Rx given for breakthrough pain - f/u next Thursday, May 15th, POW 1, DFE/OCT  2. 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 Epiretinal membrane, right eye  - original exam showed dense ERM with central thickening and pre retinal fibrosis - now POD1 s/p PPV/removal of retained lens material/tissue blue/MP OD, 05.08.25 as above - OCT shows interval improvement in central ERM/PRF and central retinal thickening - post op drops as above  4,5. Hypertensive retinopathy OU - discussed importance of tight BP control - monitor  6. Mixed Cataract OS - The symptoms of cataract, surgical options, and treatments and risks were discussed with patient. - discussed diagnosis and progression - monitor  Ophthalmic Meds Ordered this visit:  Meds ordered this encounter  Medications   erythromycin  ophthalmic ointment    Sig: Place 1 Application into the right eye 4 (four) times daily.    Dispense:  3.5 g    Refill:  5     Return in about 1 week (around 08/31/2023) for POV OD.  There are no Patient Instructions on file for this visit.  Explained the diagnoses, plan, and follow up with the patient and they expressed understanding.  Patient expressed understanding of the importance of proper follow up care.   This document serves as a record of services personally performed by Jeanice Millard, MD, PhD. It was created on their behalf by Morley Arabia. Bevin Bucks, OA an ophthalmic technician. The creation of this record is the provider's dictation and/or activities during the visit.    Electronically signed by: Morley Arabia. Bevin Bucks, OA 08/27/23 12:03 AM   Jeanice Millard, M.D., Ph.D. Diseases & Surgery of the Retina and Vitreous [facility] 08/24/2023  I have reviewed the above documentation for accuracy and completeness, and I agree with the above. Jeanice Millard, M.D., Ph.D. 08/27/23 12:06 AM   Abbreviations: M myopia (nearsighted); A astigmatism; H hyperopia (farsighted); P presbyopia; Mrx spectacle prescription;  CTL contact lenses; OD right eye; OS left eye; OU both eyes  XT exotropia; ET esotropia; PEK punctate epithelial keratitis; PEE punctate epithelial erosions; DES dry eye syndrome; MGD meibomian gland dysfunction; ATs artificial tears; PFAT's preservative free artificial tears; NSC nuclear sclerotic cataract; PSC posterior subcapsular cataract; ERM epi-retinal membrane; PVD posterior vitreous detachment; RD retinal detachment; DM diabetes mellitus; DR diabetic retinopathy; NPDR non-proliferative diabetic retinopathy; PDR proliferative diabetic retinopathy; CSME clinically significant macular edema; DME  diabetic macular edema; dbh dot blot hemorrhages; CWS cotton wool spot; POAG primary open angle glaucoma; C/D cup-to-disc ratio; HVF humphrey visual field; GVF goldmann  visual field; OCT optical coherence tomography; IOP intraocular pressure; BRVO Branch retinal vein occlusion; CRVO central retinal vein occlusion; CRAO central retinal artery occlusion; BRAO branch retinal artery occlusion; RT retinal tear; SB scleral buckle; PPV pars plana vitrectomy; VH Vitreous hemorrhage; PRP panretinal laser photocoagulation; IVK intravitreal kenalog ; VMT vitreomacular traction; MH Macular hole;  NVD neovascularization of the disc; NVE neovascularization elsewhere; AREDS age related eye disease study; ARMD age related macular degeneration; POAG primary open angle glaucoma; EBMD epithelial/anterior basement membrane dystrophy; ACIOL anterior chamber intraocular lens; IOL intraocular lens; PCIOL posterior chamber intraocular lens; Phaco/IOL phacoemulsification with intraocular lens placement; PRK photorefractive keratectomy; LASIK laser assisted in situ keratomileusis; HTN hypertension; DM diabetes mellitus; COPD chronic obstructive pulmonary disease

## 2023-08-21 NOTE — H&P (Signed)
 Stephanie Armstrong is an 68 y.o. female.    Chief Complaint: retained lens material, right eye  HPI: Pt with history of retinal detachment of the right eye s/p SB + 25g PPV w/ endolaser and gas on 12.05.24, underwent cataract extraction complicated by posterior capsular rupture and dislocation of crystalline lens into the vitreous on 04.25.25. The retained lens material has impaired vision and affected activities of daily living. After a discussion of the risks, benefits and alternatives to surgery, the patient has elected to proceed with surgery to remove the retained lens material: 25g PPV w/ lensectomy under general anesthesia.  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 This is really VIN II and not VAIN II.    Wears contact lenses     Past Surgical History:  Procedure Laterality Date   ABDOMINAL SACROCOLPOPEXY N/A 03/28/2016   Procedure: ABDOMINO SACROCOLPOPEXY;  Surgeon: Greta Leatherwood, MD;  Location: [facility];  Service: Gynecology;  Laterality: N/A;   ANAL RECTAL MANOMETRY N/A 08/09/2022   Procedure: ANO RECTAL MANOMETRY;  Surgeon: Tobin Forts, MD;  [facility];  Service: Gastroenterology;  Laterality: N/A;   ANTERIOR AND POSTERIOR REPAIR N/A 03/28/2016   Procedure: ANTERIOR (CYSTOCELE) AND POSTERIOR REPAIR (RECTOCELE);  Surgeon: Greta Leatherwood, MD;  [facility];  Service: Gynecology;  Laterality: N/A;   AUGMENTATION MAMMAPLASTY  APRIL 2013   BREAST IMPLANT CHANGED   BILATERAL SALPINGECTOMY  Bilateral 03/28/2016   Procedure: BILATERAL SALPINGECTOMY;  Surgeon: Greta Leatherwood, MD;  [facility];  Service: Gynecology;  Laterality: Bilateral;   BLADDER SUSPENSION N/A 03/28/2016   Procedure: TRANSVAGINAL TAPE (TVT) PROCEDURE exact midurethral sling;  Surgeon: Greta Leatherwood, MD;  [facility];  Service: Gynecology;  Laterality: N/A;   CATARACT EXTRACTION W/PHACO Right 08/10/2023   Procedure: PHACOEMULSIFICATION, CATARACT;  Surgeon: Ardeth Krabbe, MD;  Location: [facility];  Service: Ophthalmology;  Laterality: Right;  CDE: 6.32   COLONOSCOPY     CYSTOSCOPY N/A 03/28/2016   Procedure: CYSTOSCOPY;  Surgeon: Greta Leatherwood, MD;  [facility];  Service: Gynecology;  Laterality: N/A;   CYSTOSCOPY WITH BIOPSY Bilateral 11/03/2015   Procedure: CYSTOSCOPY WITH URETHRAL BIOPSY, FULGERATION, BILATERAL RETROGRADE PYELOGRAMS;  Surgeon: Osborn Blaze, MD;  Location: [facility];  Service: Urology;  Laterality: Bilateral;   GAS INSERTION Right 03/22/2023   Procedure: INSERTION OF GAS;  Surgeon: [name], MD;  Location: [facility] OR;  Service: Ophthalmology;  Laterality: Right;   INSERTION, STENT, DRUG-ELUTING, LACRIMAL CANALICULUS Right 08/10/2023   Procedure: INSERTION, STENT, DRUG-ELUTING, LACRIMAL CANALICULUS;  Surgeon: Ardeth Krabbe, MD;  Location: [facility];  Service: Ophthalmology;  Laterality: Right;   LYSIS OF ADHESION N/A 03/28/2016   Procedure: LYSIS OF ADHESION;  Surgeon: Greta Leatherwood, MD;  [facility];  Service: Gynecology;  Laterality: N/A;   MASS EXCISION N/A 11/03/2015   Procedure: EXCISION  URETHRAL MASS;  Surgeon: Osborn Blaze, MD;  Location: [facility];  Service: Urology;  Laterality: N/A;   SCLERAL BUCKLE Right 03/22/2023   Procedure: SCLERAL BUCKLE;  Surgeon: [name], MD;  Location: [facility] OR;  Service: Ophthalmology;  Laterality: Right;   TUBAL LIGATION     VAGINAL HYSTERECTOMY  1999    VITRECTOMY 25 GAUGE WITH SCLERAL BUCKLE Right 03/22/2023   Procedure: VITRECTOMY 25 GAUGE;  Surgeon: [name], MD;  Location: [facility] OR;  Service: Ophthalmology;  Laterality: Right;    Family History  Problem Relation Age of Onset   Hypertension Mother    Osteoporosis Mother    Paget's disease of bone Mother    Heart failure Mother    Cancer Father        MELANOMA   Diabetes Father    Hypertension Father    Heart disease Father    Cancer Sister        THYROID    Breast cancer Paternal Grandmother    Colon cancer Neg Hx    Pancreatic cancer Neg Hx    Esophageal cancer Neg Hx    Social History:  reports that she has never smoked. She has never used smokeless tobacco. She reports that she does not currently use alcohol. She reports that she does not use drugs.  Allergies:  Allergies  Allergen Reactions   Augmentin [Amoxicillin-Pot Clavulanate] Diarrhea   Codeine Nausea Only    No medications prior to admission.    Review of systems otherwise negative  There were no vitals taken for this visit.  Physical exam: Mental status: oriented x3. Eyes: See eye exam associated with this date of surgery Ears, Nose, Throat: within normal limits Neck: Within Normal limits General: within normal limits Chest: Within normal limits Breast: deferred Heart: Within normal limits Abdomen: Within normal limits GU: deferred Extremities: within normal limits Skin: within normal limits  Assessment/Plan 1. Retained lens material, RIGHT EYE  Plan: To [facility] for 25g PPV/PPL OD under general anesthesia - case scheduled for Thursday, Aug 22, 1128 am -- [facility] OR 08  Jeanice Millard, M.D., Ph.D. Vitreoretinal Surgeon [facility]

## 2023-08-23 ENCOUNTER — Encounter (HOSPITAL_COMMUNITY): Payer: Self-pay | Admitting: Ophthalmology

## 2023-08-23 ENCOUNTER — Ambulatory Visit (HOSPITAL_COMMUNITY)

## 2023-08-23 ENCOUNTER — Encounter (HOSPITAL_COMMUNITY)
Admission: RE | Disposition: A | Discharge status: Home / Self Care | Payer: Self-pay | Source: Home / Self Care | Attending: Ophthalmology

## 2023-08-23 ENCOUNTER — Ambulatory Visit (HOSPITAL_COMMUNITY)
Admission: RE | Admit: 2023-08-23 | Discharge: 2023-08-23 | Disposition: A | Discharge status: Home / Self Care | Attending: Ophthalmology | Admitting: Ophthalmology

## 2023-08-23 ENCOUNTER — Other Ambulatory Visit: Payer: Self-pay

## 2023-08-23 ENCOUNTER — Ambulatory Visit (HOSPITAL_BASED_OUTPATIENT_CLINIC_OR_DEPARTMENT_OTHER)

## 2023-08-23 DIAGNOSIS — F419 Anxiety disorder, unspecified: Secondary | ICD-10-CM

## 2023-08-23 DIAGNOSIS — H35371 Puckering of macula, right eye: Secondary | ICD-10-CM | POA: Insufficient documentation

## 2023-08-23 DIAGNOSIS — I10 Essential (primary) hypertension: Secondary | ICD-10-CM | POA: Diagnosis not present

## 2023-08-23 DIAGNOSIS — H59021 Cataract (lens) fragments in eye following cataract surgery, right eye: Secondary | ICD-10-CM

## 2023-08-23 HISTORY — PX: REMOVAL RETAINED LENS: SHX6464

## 2023-08-23 HISTORY — PX: PARS PLANA VITRECTOMY W/REMOVE OF INTERNAL LIMITING MEMBRANE W/TAMPONADE: SHX7341

## 2023-08-23 LAB — CBC
HCT: 43.1 % (ref 36.0–46.0)
Hemoglobin: 14.1 g/dL (ref 12.0–15.0)
MCH: 31.1 pg (ref 26.0–34.0)
MCHC: 32.7 g/dL (ref 30.0–36.0)
MCV: 94.9 fL (ref 80.0–100.0)
Platelets: 262 10*3/uL (ref 150–400)
RBC: 4.54 MIL/uL (ref 3.87–5.11)
RDW: 12.6 % (ref 11.5–15.5)
WBC: 8.1 10*3/uL (ref 4.0–10.5)
nRBC: 0 % (ref 0.0–0.2)

## 2023-08-23 LAB — BASIC METABOLIC PANEL WITH GFR
Anion gap: 7 (ref 5–15)
BUN: 21 mg/dL (ref 8–23)
CO2: 19 mmol/L — ABNORMAL LOW (ref 22–32)
Calcium: 9 mg/dL (ref 8.9–10.3)
Chloride: 112 mmol/L — ABNORMAL HIGH (ref 98–111)
Creatinine, Ser: 0.81 mg/dL (ref 0.44–1.00)
GFR, Estimated: >60 mL/min (ref >60)
Glucose, Bld: 87 mg/dL (ref 70–99)
Potassium: 3.5 mmol/L (ref 3.5–5.1)
Sodium: 138 mmol/L (ref 135–145)

## 2023-08-23 SURGERY — REMOVAL, RETAINED LENS MATTER
Anesthesia: General | Site: Eye | Laterality: Right

## 2023-08-23 MED ORDER — GLYCOPYRROLATE PF 0.2 MG/ML IJ SOSY
PREFILLED_SYRINGE | INTRAMUSCULAR | Status: DC | PRN
  Administered 2023-08-23: .2 mg via INTRAVENOUS

## 2023-08-23 MED ORDER — ACETAMINOPHEN 10 MG/ML IV SOLN: 1000.0000 mg | Freq: Once | INTRAVENOUS | Status: DC | PRN

## 2023-08-23 MED ORDER — TRIAMCINOLONE ACETONIDE 40 MG/ML IJ SUSP
INTRAMUSCULAR | Status: DC | PRN
  Administered 2023-08-23: 1 mL via INTRAMUSCULAR

## 2023-08-23 MED ORDER — OXYCODONE HCL 5 MG/5ML PO SOLN: 5.0000 mg | Freq: Once | ORAL | Status: AC | PRN

## 2023-08-23 MED ORDER — BUPIVACAINE HCL (PF) 0.75 % IJ SOLN
INTRAMUSCULAR | Status: AC
  Filled 2023-08-23: qty 10

## 2023-08-23 MED ORDER — OXYCODONE HCL 5 MG PO TABS
ORAL_TABLET | ORAL | Status: AC
  Filled 2023-08-23: qty 1

## 2023-08-23 MED ORDER — FENTANYL CITRATE (PF) 100 MCG/2ML IJ SOLN
25.0000 ug | INTRAMUSCULAR | Status: DC | PRN
Start: 2023-08-23 — End: 2023-08-23
  Administered 2023-08-23 (×2): 50 ug via INTRAVENOUS

## 2023-08-23 MED ORDER — SUGAMMADEX SODIUM 200 MG/2ML IV SOLN
INTRAVENOUS | Status: DC | PRN
  Administered 2023-08-23: 200 mg via INTRAVENOUS

## 2023-08-23 MED ORDER — PHENYLEPHRINE HCL-NACL 20-0.9 MG/250ML-% IV SOLN
INTRAVENOUS | Status: DC | PRN
  Administered 2023-08-23: 30 ug/min via INTRAVENOUS

## 2023-08-23 MED ORDER — BSS PLUS IO SOLN
INTRAOCULAR | Status: AC
  Filled 2023-08-23: qty 500

## 2023-08-23 MED ORDER — BACITRACIN-POLYMYXIN B 500-10000 UNIT/GM OP OINT
TOPICAL_OINTMENT | OPHTHALMIC | Status: DC | PRN
  Administered 2023-08-23: 1 via OPHTHALMIC

## 2023-08-23 MED ORDER — ACETAMINOPHEN 500 MG PO TABS
1000.0000 mg | ORAL_TABLET | Freq: Once | ORAL | Status: AC
  Administered 2023-08-23: 1000 mg via ORAL

## 2023-08-23 MED ORDER — NA CHONDROIT SULF-NA HYALURON 40-30 MG/ML IO SOSY
INTRAOCULAR | Status: AC
Start: 2023-08-23 — End: ?
  Filled 2023-08-23: qty 1

## 2023-08-23 MED ORDER — PHENYLEPHRINE HCL 10 % OP SOLN
1.0000 [drp] | OPHTHALMIC | Status: AC | PRN
  Administered 2023-08-23 (×3): 1 [drp] via OPHTHALMIC
  Filled 2023-08-23: qty 5

## 2023-08-23 MED ORDER — PROPOFOL 10 MG/ML IV BOLUS
INTRAVENOUS | Status: AC
  Filled 2023-08-23: qty 20

## 2023-08-23 MED ORDER — MIDAZOLAM HCL 2 MG/2ML IJ SOLN
INTRAMUSCULAR | Status: AC
  Filled 2023-08-23: qty 2

## 2023-08-23 MED ORDER — EPINEPHRINE PF 1 MG/ML IJ SOLN
INTRAMUSCULAR | Status: DC | PRN
  Administered 2023-08-23 (×2): .3 mg

## 2023-08-23 MED ORDER — ONDANSETRON HCL 4 MG/2ML IJ SOLN
INTRAMUSCULAR | Status: DC | PRN
  Administered 2023-08-23: 4 mg via INTRAVENOUS

## 2023-08-23 MED ORDER — BSS IO SOLN
INTRAOCULAR | Status: AC
  Filled 2023-08-23: qty 15

## 2023-08-23 MED ORDER — FENTANYL CITRATE (PF) 250 MCG/5ML IJ SOLN
INTRAMUSCULAR | Status: DC | PRN
  Administered 2023-08-23: 25 ug via INTRAVENOUS
  Administered 2023-08-23: 100 ug via INTRAVENOUS

## 2023-08-23 MED ORDER — FENTANYL CITRATE (PF) 250 MCG/5ML IJ SOLN
INTRAMUSCULAR | Status: AC
  Filled 2023-08-23: qty 5

## 2023-08-23 MED ORDER — LIDOCAINE HCL 1 % IJ SOLN
INTRAMUSCULAR | Status: AC
  Filled 2023-08-23: qty 20

## 2023-08-23 MED ORDER — BRIMONIDINE TARTRATE 0.2 % OP SOLN
OPHTHALMIC | Status: AC
  Filled 2023-08-23: qty 5

## 2023-08-23 MED ORDER — CARBACHOL 0.01 % IO SOLN
INTRAOCULAR | Status: AC
  Filled 2023-08-23: qty 1.5

## 2023-08-23 MED ORDER — CEFTAZIDIME 1 G IJ SOLR
INTRAMUSCULAR | Status: AC
  Filled 2023-08-23: qty 1

## 2023-08-23 MED ORDER — PROPOFOL 10 MG/ML IV BOLUS
INTRAVENOUS | Status: DC | PRN
Start: 2023-08-23 — End: 2023-08-23
  Administered 2023-08-23: 100 mg via INTRAVENOUS

## 2023-08-23 MED ORDER — DEXAMETHASONE SODIUM PHOSPHATE 10 MG/ML IJ SOLN
INTRAMUSCULAR | Status: AC
Start: 2023-08-23 — End: ?
  Filled 2023-08-23: qty 1

## 2023-08-23 MED ORDER — MIDAZOLAM HCL 2 MG/2ML IJ SOLN
INTRAMUSCULAR | Status: DC | PRN
  Administered 2023-08-23: 2 mg via INTRAVENOUS

## 2023-08-23 MED ORDER — OXYCODONE HCL 5 MG PO TABS
5.0000 mg | ORAL_TABLET | Freq: Once | ORAL | Status: AC | PRN
  Administered 2023-08-23: 5 mg via ORAL

## 2023-08-23 MED ORDER — TRIAMCINOLONE ACETONIDE 40 MG/ML IJ SUSP
INTRAMUSCULAR | Status: AC
Start: 2023-08-23 — End: ?
  Filled 2023-08-23: qty 5

## 2023-08-23 MED ORDER — BRIMONIDINE TARTRATE 0.2 % OP SOLN
OPHTHALMIC | Status: DC | PRN
  Administered 2023-08-23: 1 [drp] via OPHTHALMIC

## 2023-08-23 MED ORDER — ATROPINE SULFATE 1 % OP SOLN
1.0000 [drp] | OPHTHALMIC | Status: AC | PRN
  Administered 2023-08-23 (×3): 1 [drp] via OPHTHALMIC
  Filled 2023-08-23: qty 2

## 2023-08-23 MED ORDER — PHENYLEPHRINE 80 MCG/ML (10ML) SYRINGE FOR IV PUSH (FOR BLOOD PRESSURE SUPPORT)
PREFILLED_SYRINGE | INTRAVENOUS | Status: DC | PRN
  Administered 2023-08-23: 80 ug via INTRAVENOUS

## 2023-08-23 MED ORDER — GATIFLOXACIN 0.5 % OP SOLN
OPHTHALMIC | Status: AC
  Filled 2023-08-23: qty 2.5

## 2023-08-23 MED ORDER — PROPARACAINE HCL 0.5 % OP SOLN
1.0000 [drp] | OPHTHALMIC | Status: AC | PRN
  Administered 2023-08-23 (×3): 1 [drp] via OPHTHALMIC
  Filled 2023-08-23: qty 15

## 2023-08-23 MED ORDER — DEXAMETHASONE SODIUM PHOSPHATE 10 MG/ML IJ SOLN
INTRAMUSCULAR | Status: DC | PRN
  Administered 2023-08-23: 10 mg via INTRAVENOUS

## 2023-08-23 MED ORDER — FENTANYL CITRATE (PF) 100 MCG/2ML IJ SOLN
INTRAMUSCULAR | Status: AC
  Filled 2023-08-23: qty 2

## 2023-08-23 MED ORDER — ACETAZOLAMIDE SODIUM 500 MG IJ SOLR
INTRAMUSCULAR | Status: AC
  Filled 2023-08-23: qty 500

## 2023-08-23 MED ORDER — ROCURONIUM BROMIDE 10 MG/ML (PF) SYRINGE
PREFILLED_SYRINGE | INTRAVENOUS | Status: DC | PRN
Start: 2023-08-23 — End: 2023-08-23
  Administered 2023-08-23 (×2): 10 mg via INTRAVENOUS
  Administered 2023-08-23: 50 mg via INTRAVENOUS

## 2023-08-23 MED ORDER — BACITRACIN-POLYMYXIN B 500-10000 UNIT/GM OP OINT
TOPICAL_OINTMENT | OPHTHALMIC | Status: AC
  Filled 2023-08-23: qty 3.5

## 2023-08-23 MED ORDER — SODIUM CHLORIDE 0.9% FLUSH
3.0000 mL | Freq: Two times a day (BID) | INTRAVENOUS | Status: DC
Start: 2023-08-23 — End: 2023-08-23

## 2023-08-23 MED ORDER — ACETAMINOPHEN 500 MG PO TABS
ORAL_TABLET | ORAL | Status: AC
  Filled 2023-08-23: qty 2

## 2023-08-23 MED ORDER — CYCLOPENTOLATE HCL 1 % OP SOLN
1.0000 [drp] | OPHTHALMIC | Status: AC | PRN
  Administered 2023-08-23 (×3): 1 [drp] via OPHTHALMIC
  Filled 2023-08-23: qty 2

## 2023-08-23 MED ORDER — BSS IO SOLN
INTRAOCULAR | Status: DC | PRN
  Administered 2023-08-23: 15 mL via INTRAOCULAR

## 2023-08-23 MED ORDER — SODIUM CHLORIDE 0.9 % IV SOLN: INTRAVENOUS | Status: DC

## 2023-08-23 MED ORDER — ATROPINE SULFATE 1 % OP SOLN
OPHTHALMIC | Status: AC
Start: 2023-08-23 — End: ?
  Filled 2023-08-23: qty 5

## 2023-08-23 MED ORDER — SODIUM CHLORIDE (PF) 0.9 % IJ SOLN
INTRAMUSCULAR | Status: AC
Start: 2023-08-23 — End: ?
  Filled 2023-08-23: qty 10

## 2023-08-23 MED ORDER — ORAL CARE MOUTH RINSE: 15.0000 mL | Freq: Once | OROMUCOSAL | Status: AC

## 2023-08-23 MED ORDER — LIDOCAINE 2% (20 MG/ML) 5 ML SYRINGE
INTRAMUSCULAR | Status: DC | PRN
  Administered 2023-08-23: 60 mg via INTRAVENOUS

## 2023-08-23 MED ORDER — ATROPINE SULFATE 1 % OP SOLN
OPHTHALMIC | Status: DC | PRN
  Administered 2023-08-23: 1 [drp] via OPHTHALMIC

## 2023-08-23 MED ORDER — DORZOLAMIDE HCL-TIMOLOL MAL 2-0.5 % OP SOLN
OPHTHALMIC | Status: DC | PRN
  Administered 2023-08-23: 1 [drp] via OPHTHALMIC

## 2023-08-23 MED ORDER — SODIUM CHLORIDE 0.9% FLUSH: 3.0000 mL | INTRAVENOUS | Status: DC | PRN

## 2023-08-23 MED ORDER — GATIFLOXACIN 0.5 % OP SOLN OPTIME - NO CHARGE
OPHTHALMIC | Status: DC | PRN
  Administered 2023-08-23: 1 [drp] via OPHTHALMIC

## 2023-08-23 MED ORDER — EPINEPHRINE PF 1 MG/ML IJ SOLN
INTRAMUSCULAR | Status: AC
Start: 2023-08-23 — End: ?
  Filled 2023-08-23: qty 1

## 2023-08-23 MED ORDER — BSS PLUS IO SOLN
INTRAOCULAR | Status: DC | PRN
  Administered 2023-08-23 (×2): 1 via INTRAOCULAR

## 2023-08-23 MED ORDER — PREDNISOLONE ACETATE 1 % OP SUSP
OPHTHALMIC | Status: DC | PRN
  Administered 2023-08-23: 1 [drp] via OPHTHALMIC

## 2023-08-23 MED ORDER — STERILE WATER FOR INJECTION IJ SOLN
INTRAMUSCULAR | Status: DC | PRN
  Administered 2023-08-23: .5 mL

## 2023-08-23 MED ORDER — PREDNISOLONE ACETATE 1 % OP SUSP
OPHTHALMIC | Status: AC
  Filled 2023-08-23: qty 5

## 2023-08-23 MED ORDER — 0.9 % SODIUM CHLORIDE (POUR BTL) OPTIME
TOPICAL | Status: DC | PRN
  Administered 2023-08-23: 1000 mL

## 2023-08-23 MED ORDER — EPINEPHRINE PF 1 MG/ML IJ SOLN
INTRAMUSCULAR | Status: AC
  Filled 2023-08-23: qty 1

## 2023-08-23 MED ORDER — STERILE WATER FOR INJECTION IJ SOLN
INTRAMUSCULAR | Status: AC
  Filled 2023-08-23: qty 10

## 2023-08-23 MED ORDER — BRILLIANT BLUE G 0.025 % IO SOSY
0.5000 mL | PREFILLED_SYRINGE | INTRAOCULAR | Status: AC
  Administered 2023-08-23: .5 mL via INTRAVITREAL
  Filled 2023-08-23: qty 0.5

## 2023-08-23 MED ORDER — CHLORHEXIDINE GLUCONATE 0.12 % MT SOLN
15.0000 mL | Freq: Once | OROMUCOSAL | Status: AC
  Administered 2023-08-23: 15 mL via OROMUCOSAL
  Filled 2023-08-23: qty 15

## 2023-08-23 MED ORDER — DORZOLAMIDE HCL-TIMOLOL MAL 2-0.5 % OP SOLN
OPHTHALMIC | Status: AC
  Filled 2023-08-23: qty 10

## 2023-08-23 MED ORDER — DROPERIDOL 2.5 MG/ML IJ SOLN: 0.6250 mg | Freq: Once | INTRAMUSCULAR | Status: DC | PRN

## 2023-08-23 MED ORDER — TROPICAMIDE 1 % OP SOLN
1.0000 [drp] | OPHTHALMIC | Status: AC | PRN
  Administered 2023-08-23 (×3): 1 [drp] via OPHTHALMIC
  Filled 2023-08-23: qty 15

## 2023-08-23 MED ORDER — NA CHONDROIT SULF-NA HYALURON 40-30 MG/ML IO SOSY
INTRAOCULAR | Status: DC | PRN
  Administered 2023-08-23: .5 mL via INTRAOCULAR

## 2023-08-23 MED ORDER — POLYMYXIN B SULFATE 500000 UNITS IJ SOLR
INTRAMUSCULAR | Status: AC
  Filled 2023-08-23: qty 10

## 2023-08-23 SURGICAL SUPPLY — 70 items
APPLICATOR COTTON TIP 6 STRL (MISCELLANEOUS) ×20 IMPLANT
APPLICATOR COTTON TIP 6IN STRL (MISCELLANEOUS) ×10 IMPLANT
BAG COUNTER SPONGE SURGICOUNT (BAG) ×2 IMPLANT
BAND WRIST GAS GREEN (MISCELLANEOUS) IMPLANT
BETADINE 5% OPHTHALMIC (OPHTHALMIC) ×4 IMPLANT
BLADE EYE CATARACT 19 1.4 BEAV (BLADE) IMPLANT
BLADE MVR KNIFE 20G (BLADE) ×2 IMPLANT
BNDG EYE OVAL 2 1/8 X 2 5/8 (GAUZE/BANDAGES/DRESSINGS) ×2 IMPLANT
CABLE BIPOLOR RESECTION CORD (MISCELLANEOUS) ×2 IMPLANT
CANNULA ANT CHAM MAIN (OPHTHALMIC RELATED) IMPLANT
CANNULA FLEX TIP 25G (CANNULA) ×2 IMPLANT
CANNULA TROCAR 23 GA VLV (OPHTHALMIC) IMPLANT
CANNULA TROCAR 23G VLV (OPHTHALMIC) IMPLANT
CANNULA VLV SOFT TIP 25G (OPHTHALMIC) IMPLANT
CANNULA VLV SOFT TIP 25GA (OPHTHALMIC) ×1 IMPLANT
CLSR STERI-STRIP ANTIMIC 1/2X4 (GAUZE/BANDAGES/DRESSINGS) ×2 IMPLANT
DRAPE INCISE 51X51 W/FILM STRL (DRAPES) ×2 IMPLANT
DRAPE MICROSCOPE LEICA 46X105 (MISCELLANEOUS) ×2 IMPLANT
DRAPE OPHTHALMIC 77X100 STRL (CUSTOM PROCEDURE TRAY) ×2 IMPLANT
ERASER HMR WETFIELD 23G BP (MISCELLANEOUS) IMPLANT
FILTER STRAW FLUID ASPIR (MISCELLANEOUS) IMPLANT
FORCEPS GRIESHABER ILM 25G A (INSTRUMENTS) IMPLANT
GAS AUTO FILL CONSTELLATION (OPHTHALMIC) IMPLANT
GLOVE BIO SURGEON STRL SZ7.5 (GLOVE) ×4 IMPLANT
GLOVE BIOGEL M 7.0 STRL (GLOVE) ×2 IMPLANT
GOWN STRL REUS W/ TWL LRG LVL3 (GOWN DISPOSABLE) ×4 IMPLANT
GOWN STRL REUS W/ TWL XL LVL3 (GOWN DISPOSABLE) ×2 IMPLANT
KIT BASIN OR (CUSTOM PROCEDURE TRAY) ×2 IMPLANT
KIT PERFLUORON PROCEDURE 5ML (MISCELLANEOUS) IMPLANT
LENS BIOM SUPER VIEW SET DISP (MISCELLANEOUS) IMPLANT
LENS VITRECTOMY FLAT OCLR DISP (MISCELLANEOUS) IMPLANT
NDL 18GX1X1/2 (RX/OR ONLY) (NEEDLE) ×2 IMPLANT
NDL 25GX 5/8IN NON SAFETY (NEEDLE) ×10 IMPLANT
NDL HYPO 30X.5 LL (NEEDLE) ×4 IMPLANT
NDL PRECISIONGLIDE 27X1.5 (NEEDLE) IMPLANT
NEEDLE 18GX1X1/2 (RX/OR ONLY) (NEEDLE) ×1 IMPLANT
NEEDLE 25GX 5/8IN NON SAFETY (NEEDLE) ×5 IMPLANT
NEEDLE HYPO 30X.5 LL (NEEDLE) ×2 IMPLANT
NEEDLE PRECISIONGLIDE 27X1.5 (NEEDLE) IMPLANT
NS IRRIG 1000ML POUR BTL (IV SOLUTION) ×2 IMPLANT
PACK VITRECTOMY CUSTOM (CUSTOM PROCEDURE TRAY) ×2 IMPLANT
PAD ARMBOARD POSITIONER FOAM (MISCELLANEOUS) ×4 IMPLANT
PAK PIK VITRECTOMY CVS 25GA (OPHTHALMIC) ×2 IMPLANT
PENCIL BIPOLAR 25GA STR DISP (OPHTHALMIC RELATED) ×2 IMPLANT
PROBE DIATHERMY DSP 27GA (MISCELLANEOUS) IMPLANT
PROBE ENDO DIATHERMY 25G (MISCELLANEOUS) ×2 IMPLANT
PROBE LASER ILLUM FLEX CVD 25G (OPHTHALMIC) IMPLANT
REPL STRA BRUSH NDL (NEEDLE) IMPLANT
REPL STRA BRUSH NEEDLE (NEEDLE) IMPLANT
RESERVOIR BACK FLUSH (MISCELLANEOUS) IMPLANT
RETRACTOR IRIS FLEX 25G GRIESH (INSTRUMENTS) IMPLANT
SHIELD EYE LENSE ONLY DISP (GAUZE/BANDAGES/DRESSINGS) IMPLANT
SPONGE SURGIFOAM ABS GEL 12-7 (HEMOSTASIS) IMPLANT
STOPCOCK 4 WAY LG BORE MALE ST (IV SETS) IMPLANT
SUT CHROMIC 7 0 TG140 8 (SUTURE) ×2 IMPLANT
SUT ETHILON 5.0 S-24 (SUTURE) ×2 IMPLANT
SUT ETHILON 9 0 TG140 8 (SUTURE) ×2 IMPLANT
SUT MERSILENE 4 0 RV 2 (SUTURE) ×2 IMPLANT
SUT SILK 2-0 18XBRD TIE 12 (SUTURE) ×2 IMPLANT
SUT SILK 2-0 18XBRD TIE BLK (SUTURE) ×2 IMPLANT
SUT SILK 4 0 RB 1 (SUTURE) ×2 IMPLANT
SUT VICRYL 7 0 TG140 8 (SUTURE) ×2 IMPLANT
SYR 10ML LL (SYRINGE) ×2 IMPLANT
SYR 20ML LL LF (SYRINGE) ×2 IMPLANT
SYR 5ML LL (SYRINGE) ×2 IMPLANT
SYR BULB EAR ULCER 3OZ GRN STR (SYRINGE) ×2 IMPLANT
SYR TB 1ML LUER SLIP (SYRINGE) IMPLANT
TOWEL GREEN STERILE FF (TOWEL DISPOSABLE) ×2 IMPLANT
TUBING HIGH PRESS EXTEN 6IN (TUBING) ×2 IMPLANT
WATER STERILE IRR 1000ML POUR (IV SOLUTION) ×2 IMPLANT

## 2023-08-23 NOTE — Anesthesia Procedure Notes (Signed)
 Procedure Name: Intubation Date/Time: 08/23/2023 11:53 AM  Performed by: Hershall Lory, CRNAPre-anesthesia Checklist: Patient identified, Emergency Drugs available, Suction available and Patient being monitored Patient Re-evaluated:Patient Re-evaluated prior to induction Oxygen Delivery Method: Circle system utilized Preoxygenation: Pre-oxygenation with 100% oxygen Induction Type: IV induction Ventilation: Mask ventilation without difficulty Laryngoscope Size: Mac and 4 Grade View: Grade II Tube type: Oral Tube size: 7.0 mm Number of attempts: 1 Airway Equipment and Method: Stylet and Oral airway Placement Confirmation: ETT inserted through vocal cords under direct vision, positive ETCO2 and breath sounds checked- equal and bilateral Secured at: 22 cm Tube secured with: Tape Dental Injury: Teeth and Oropharynx as per pre-operative assessment

## 2023-08-23 NOTE — Anesthesia Preprocedure Evaluation (Signed)
 Anesthesia Evaluation  Patient identified by MRN, date of birth, ID band Patient awake    Reviewed: Allergy & Precautions, H&P , NPO status , Patient's Chart, lab work & pertinent test results, reviewed documented beta blocker date and time   Airway Mallampati: II  TM Distance: >3 FB Neck ROM: full    Dental no notable dental hx.    Pulmonary neg pulmonary ROS   Pulmonary exam normal breath sounds clear to auscultation       Cardiovascular Exercise Tolerance: Good hypertension, Normal cardiovascular exam Rhythm:regular Rate:Normal     Neuro/Psych  PSYCHIATRIC DISORDERS Anxiety      Neuromuscular disease  negative psych ROS   GI/Hepatic negative GI ROS,,,(+) Hepatitis -  Endo/Other  negative endocrine ROS    Renal/GU negative Renal ROS  negative genitourinary   Musculoskeletal  (+) Arthritis ,    Abdominal   Peds  Hematology negative hematology ROS (+)   Anesthesia Other Findings retained lens material and epiretinal membrane, right eye  Reproductive/Obstetrics negative OB ROS                              Anesthesia Physical Anesthesia Plan  ASA: 3  Anesthesia Plan: General   Post-op Pain Management: Tylenol  PO (pre-op)*   Induction:   PONV Risk Score and Plan: 3 and Ondansetron , Dexamethasone  and Treatment may vary due to age or medical condition  Airway Management Planned: Oral ETT  Additional Equipment: None  Intra-op Plan:   Post-operative Plan: Extubation in OR  Informed Consent: I have reviewed the patients History and Physical, chart, labs and discussed the procedure including the risks, benefits and alternatives for the proposed anesthesia with the patient or authorized representative who has indicated his/her understanding and acceptance.     Dental Advisory Given  Plan Discussed with: CRNA  Anesthesia Plan Comments:         Anesthesia Quick  Evaluation

## 2023-08-23 NOTE — Discharge Instructions (Signed)
POSTOPERATIVE INSTRUCTIONS  Your doctor has performed vitreoretinal surgery on you at [facility]. [place] eye patched and shielded until seen by Dr. Coralyn Pear 8 AM tomorrow in clinic - Do not use drops until return - Sleep with head elevated 30-45 degrees   - No strenuous bending, stooping or lifting.  - You may not drive until further notice.  - Tylenol or any other over-the-counter pain reliever can be used according to your doctor. If more pain medicine is required, your doctor will have a prescription for you.  - You may read, go up and down stairs, and watch television.     Bernarda Caffey, M.D., Ph.D.

## 2023-08-23 NOTE — Brief Op Note (Signed)
 08/23/2023  2:43 PM  PATIENT:  Stephanie Armstrong  68 y.o. female  PRE-OPERATIVE DIAGNOSIS:  retained lens material and epiretinal membrane, right eye  POST-OPERATIVE DIAGNOSIS:  retained lens material and epiretinal membrane, right eye  PROCEDURE:  Procedure(s): REMOVAL, RETAINED LENS MATTER (Right) PARS PLANA VITRECTOMY WITH 25 GAUGE WITH REMOVAL OF INTERNAL LIMITING MEMBRANE OF RETINA WITH INTRAOCULAR TAMPONADE (Right)  SURGEON:  Surgeons and Role:    [name], MD - Primary  ASSISTANTS: Argyle Began, Ophthalmic Assistant    ANESTHESIA:   general  EBL:  minimal   BLOOD ADMINISTERED:none  DRAINS: none   LOCAL MEDICATIONS USED:  NONE  SPECIMEN:  No Specimen  DISPOSITION OF SPECIMEN:  N/A  COUNTS:  YES  TOURNIQUET:  * No tourniquets in log *  DICTATION: .Note written in EPIC  PLAN OF CARE: Discharge to home after PACU  PATIENT DISPOSITION:  PACU - hemodynamically stable.   Delay start of Pharmacological VTE agent (>24hrs) due to surgical blood loss or risk of bleeding: not applicable

## 2023-08-23 NOTE — Transfer of Care (Signed)
 Immediate Anesthesia Transfer of Care Note  Patient: Stephanie Armstrong  Procedure(s) Performed: REMOVAL, RETAINED LENS MATTER (Right: Eye) PARS PLANA VITRECTOMY WITH 25 GAUGE WITH REMOVAL OF INTERNAL LIMITING MEMBRANE OF RETINA WITH INTRAOCULAR TAMPONADE (Right: Eye)  Patient Location: PACU  Anesthesia Type:General  Level of Consciousness: awake, alert , and oriented  Airway & Oxygen Therapy: Patient Spontanous Breathing  Post-op Assessment: Report given to RN  Post vital signs: Reviewed and stable  Last Vitals:  Vitals Value Taken Time  BP 127/68 08/23/23 1443  Temp 36.7 C 08/23/23 1443  Pulse 78 08/23/23 1444  Resp 14 08/23/23 1444  SpO2 96 % 08/23/23 1444  Vitals shown include unfiled device data.  Last Pain:  Vitals:   08/23/23 1000  TempSrc:   PainSc: 0-No pain      Patients Stated Pain Goal: 0 (08/23/23 0933)  Complications: No notable events documented.

## 2023-08-23 NOTE — Op Note (Signed)
 Date of procedure: 05.08.2025   Surgeon: [name], M.D., Ph.D    Assistant: Argyle Began, OA   Pre-operative Diagnosis:  1. Retained lens material, Right eye 2. Epiretinal membrane, Right eye   Post-operative diagnosis:  Same   Anesthesia: General   Procedure: 1)     25 gauge pars plana vitrectomy, Right Eye 2)     Pars plana lensectomy, Right Eye 3)     TissueBlue stain, Right Eye 4)     Epiretinal membrane and Internal Limiting Membrane peel, Right Eye     Complications: none Estimated blood loss: minimal Specimens: none   Brief history:   Pt with history of an epiretinal membrane and retinal detachment of the right eye s/p SB + 25g PPV w/ endolaser and gas on 12.05.24, underwent cataract extraction complicated by posterior capsular rupture and dislocation of crystalline lens into the vitreous on 04.25.25. The retained lens material has impaired vision and affected activities of daily living.  The risks, benefits, and alternatives were explained to the patient, including pain, bleeding, infection, loss of vision, double vision, droopy eyelids, and need for more surgeries.  Informed consent was obtained from the patient and placed in the chart.     Procedure: The patient was brought to the preoperative holding area where the correct eye was confirmed and marked.  The patient was then brought to the operating room where general endotracheal anesthesia was induced by the Anesthesia team without complication. A time-out was performed to identify the correct patient, eye, procedure, and any allergies. The eye was prepped and draped in the usual sterile ophthalmic fashion followed by placement of a lid speculum.    A 25 gauge trocar was placed in the inferotemporal quadrant 3.5 mm posterior to the limbus in a beveled fashion. A 4 mm infusion cannula was placed through this trocar, and the infusion cannula was confirmed in the vitreous cavity with no incarceration of retina or choroid  prior to turning it on. Two additional 25 gauge trocars were placed in the superonasal and superotemporal quadrants in a similar beveled fashion. The light pipe and cutter were introduced into the eye. Direct vitrectomy was was performed to remove residual cortical cataract and capsule.    At this time, a standard three-port pars plana vitrectomy was performed using the light pipe, the cutter, and the BIOM viewing system. Of note, pt's vitreous cavity was already devoid of vitreous from her prior retinal detachment surgery. A large lenticular remnant composed of nuclear and cortical material and other lens fragments were settled on the macula. These were carefully removed using the light pipe and vitrectomy probe without complication. Visualization of the posterior pole and peripheral retina showed that the retina remained attached and without any new tears or breaks.  After removal of the retained lens material, we had a clear view of the macula. Inspection of the macula showed a prominent epiretinal membrane w/ preretinal fibrosis and central thickening of the retina. As the view to the macula was clear and visualization of the epirtetinal membrane was quite sharp, the decision was made to try to peel the epiretinal membrane.  TissueBlue was then used to stain the internal limiting membrane and counter-stain the ERM. A macular contact lens was placed on the eye. End-grasping ILM forceps were used to create an opening in the ILM and the very thick ERM and associated preretinal fibrosis was peeled fully from the superior macula taking care to avoid traction on the fovea. Attempts to peel the ILM/ERM  complex in the inferior retina were unsuccessful due to gumminess of the intertwined membranes. The membranes in the inferior retina were left.   The wide angle viewing system was brought back into position. The vitrectomy probe was used to remove fragments of peeled ERM and ILM. The endolaser was then used to  cauterize a focal intraretinal hemorrhage in the IT macula.   The trocars were then removed and sutured with 7-0 vicryl in an interrupted fashion. Subconjunctival injections of antibiotic and Kenalog  were administered. The lid speculum and drapes were removed. Drops of an antibiotic and steroid were given. The eye was patched and shielded. The patient tolerated the procedure well without any intraoperative or immediate postoperative complications. The patient was taken to the recovery room in good condition. The patient was instructed to follow-up with Dr. Karyl Paget in clinic on the following morning.

## 2023-08-23 NOTE — Interval H&P Note (Signed)
 History and Physical Interval Note:  08/23/2023 11:24 AM  Stephanie Armstrong  has presented today for surgery, with the diagnosis of retained lens material and epiretinal membrane, right eye.  The various methods of treatment have been discussed with the patient and family. After consideration of risks, benefits and other options for treatment, the patient has consented to  Procedure(s): REMOVAL, RETAINED LENS MATTER (Right) PARS PLANA VITRECTOMY WITH 25 GAUGE WITH REMOVAL OF INTERNAL LIMITING MEMBRANE OF RETINA WITH INTRAOCULAR TAMPONADE (Right) as a surgical intervention.  The patient's history has been reviewed, patient examined, no change in status, stable for surgery.  I have reviewed the patient's chart and labs.  Questions were answered to the patient's satisfaction.     Stephanie Armstrong

## 2023-08-24 ENCOUNTER — Encounter (INDEPENDENT_AMBULATORY_CARE_PROVIDER_SITE_OTHER): Admitting: Ophthalmology

## 2023-08-24 ENCOUNTER — Ambulatory Visit (INDEPENDENT_AMBULATORY_CARE_PROVIDER_SITE_OTHER): Admitting: Ophthalmology

## 2023-08-24 ENCOUNTER — Encounter (HOSPITAL_COMMUNITY): Payer: Self-pay | Admitting: Ophthalmology

## 2023-08-24 DIAGNOSIS — H25813 Combined forms of age-related cataract, bilateral: Secondary | ICD-10-CM

## 2023-08-24 DIAGNOSIS — I10 Essential (primary) hypertension: Secondary | ICD-10-CM

## 2023-08-24 DIAGNOSIS — H35371 Puckering of macula, right eye: Secondary | ICD-10-CM

## 2023-08-24 DIAGNOSIS — H59021 Cataract (lens) fragments in eye following cataract surgery, right eye: Secondary | ICD-10-CM

## 2023-08-24 DIAGNOSIS — H25812 Combined forms of age-related cataract, left eye: Secondary | ICD-10-CM

## 2023-08-24 DIAGNOSIS — H35033 Hypertensive retinopathy, bilateral: Secondary | ICD-10-CM

## 2023-08-24 DIAGNOSIS — H3321 Serous retinal detachment, right eye: Secondary | ICD-10-CM

## 2023-08-24 MED ORDER — ERYTHROMYCIN 5 MG/GM OP OINT: 1.0000 | TOPICAL_OINTMENT | Freq: Four times a day (QID) | OPHTHALMIC | 5 refills | Status: DC

## 2023-08-24 NOTE — Anesthesia Postprocedure Evaluation (Signed)
 Anesthesia Post Note  Patient: Stephanie Armstrong  Procedure(s) Performed: REMOVAL, RETAINED LENS MATTER (Right: Eye) PARS PLANA VITRECTOMY WITH 25 GAUGE WITH REMOVAL OF INTERNAL LIMITING MEMBRANE OF RETINA WITH INTRAOCULAR TAMPONADE (Right: Eye)     Patient location during evaluation: PACU Anesthesia Type: General Level of consciousness: awake and alert Pain management: pain level controlled Vital Signs Assessment: post-procedure vital signs reviewed and stable Respiratory status: spontaneous breathing, nonlabored ventilation, respiratory function stable and patient connected to nasal cannula oxygen Cardiovascular status: blood pressure returned to baseline and stable Postop Assessment: no apparent nausea or vomiting Anesthetic complications: no   No notable events documented.  Last Vitals:  Vitals:   08/23/23 1515 08/23/23 1530  BP: 124/79 127/75  Pulse: 76 79  Resp: 16 13  Temp:  36.7 C  SpO2: 96% 97%    Last Pain:  Vitals:   08/23/23 1527  TempSrc:   PainSc: 3                  Lethaniel Rave

## 2023-08-27 ENCOUNTER — Encounter (INDEPENDENT_AMBULATORY_CARE_PROVIDER_SITE_OTHER): Payer: Self-pay | Admitting: Ophthalmology

## 2023-08-29 NOTE — Progress Notes (Shared)
 [facility] - Clinic Note  08/30/2023   CHIEF COMPLAINT Patient presents for Retina Follow Up  HISTORY OF PRESENT ILLNESS: Stephanie Armstrong is a 69 y.o. female who presents to the clinic today for:  HPI     Retina Follow Up   In right eye.  This started 1 day ago.  Duration of 1 week.  Since onset it is stable.  I, the attending physician,  performed the HPI with the patient and updated documentation appropriately.        Comments   Patient states the vision is still blurry but its better than it was when the broken lens was in the eye. She is using PF OD 6x day, zymaxid  QID OD, Atropine  BID OD, and erythromycin ung QID OD.       Last edited by [name], MD on 08/30/2023 12:29 PM.    Pt states vision is improving  Referring physician: Kathyleen Parkins, MD [address] [place],  [place] [address]  HISTORICAL INFORMATION:  Selected notes from the MEDICAL RECORD NUMBER Referred by Dr. Barbra Boone for concern of macular hole OD LEE:  Ocular Hx- PMH-   CURRENT MEDICATIONS: Current Outpatient Medications (Ophthalmic Drugs)  Medication Sig   brimonidine  (ALPHAGAN ) 0.2 % ophthalmic solution Place 1 drop into the right eye in the morning and at bedtime.   erythromycin ophthalmic ointment Place 1 Application into the right eye 4 (four) times daily.   prednisoLONE  acetate (PRED FORTE ) 1 % ophthalmic suspension Place 1 drop into the right eye 6 (six) times daily.   timolol  (TIMOPTIC ) 0.5 % ophthalmic solution Place 1 drop into the right eye 2 (two) times daily.   No current facility-administered medications for this visit. (Ophthalmic Drugs)   Current Outpatient Medications (Other)  Medication Sig   acetaZOLAMIDE  ER (DIAMOX ) 500 MG capsule Take 500 mg by mouth 2 (two) times daily.   ALPRAZolam  (XANAX ) 0.5 MG tablet Take 1 tab p.o. qhs for 2 weeks, then take 1/2 tab p.o. qhs, then take 1/2 tab qod, then discontinue. (Patient taking differently: Take 0.25  mg by mouth 2 (two) times daily as needed for anxiety.)   amLODipine-benazepril (LOTREL) 5-40 MG capsule TAKE (1) CAPSULE BY MOUTH ONCE DAILY.   Cholecalciferol (VITAMIN D3) 125 MCG (5000 UT) CAPS Take 5,000 Units by mouth daily.   diphenoxylate-atropine  (LOMOTIL) 2.5-0.025 MG tablet TAKE 2 TABLETS BY MOUTH 4 TIMES DAILY AS NEEDED FOR DIARRHEA OR LOOSE STOOLS. MAX 8 TABLETS IN 24 HOURS.   estradiol  (ESTRACE ) 0.1 MG/GM vaginal cream Place 1/2 gram vaginally at hs two to three times per week as needed. (Patient taking differently: Place 0.5 Applicatorfuls vaginally 2 (two) times a week.)   fluorouracil (EFUDEX) 5 % cream Apply 1 Application topically daily as needed (skin cancer issue).   hydrochlorothiazide (HYDRODIURIL) 25 MG tablet TAKE ONE TABLET BY MOUTH ONCE DAILY AS NEEDED. DO NOT EXCEED 3 PER WEEK. APPT REQUIRED FOR FUTURE REFILLS (Patient taking differently: Take 25 mg by mouth 3 (three) times a week.  DO NOT EXCEED 3 PER WEEK. APPT REQUIRED FOR FUTURE REFILLS)   ibuprofen  (ADVIL ) 800 MG tablet Take 1 tablet (800 mg total) by mouth every 8 (eight) hours as needed.   pantoprazole (PROTONIX) 40 MG tablet Take 40 mg by mouth daily as needed (acid reflux).   Probiotic, Lactobacillus, CAPS Take 1 capsule by mouth daily.   Semaglutide,0.25 or 0.5MG /DOS, (OZEMPIC, 0.25 OR 0.5 MG/DOSE,) 2 MG/1.5ML SOPN Inject 0.5 mg into the skin  once a week.   tretinoin (RETIN-A) 0.05 % cream Apply 1 Application topically 3 (three) times a week.   TRULANCE  3 MG TABS TAKE 1 TABLET BY MOUTH DAILY (Patient taking differently: Take 3 mg by mouth 2 (two) times a week.)   zolpidem (AMBIEN) 10 MG tablet Take 10 mg by mouth at bedtime.   No current facility-administered medications for this visit. (Other)   REVIEW OF SYSTEMS: ROS   Positive for: Constitutional, Gastrointestinal, Eyes Negative for: Neurological, Skin, Genitourinary, Musculoskeletal, HENT, Endocrine, Cardiovascular, Respiratory, Psychiatric, Allergic/Imm,  Heme/Lymph Last edited by Olene Berne, COT on 08/30/2023  7:56 AM.     ALLERGIES Allergies  Allergen Reactions   Augmentin [Amoxicillin-Pot Clavulanate] Diarrhea   Codeine Nausea Only   PAST MEDICAL HISTORY Past Medical History:  Diagnosis Date   Allergy    Anxiety    Arthritis    fingers   ASCUS with positive high risk human papillomavirus of vagina dx 10/11/15   Cancer (HCC)    basal cell skin   Hepatitis    age 49 , unknown type   History of vulvar dysplasia    VIN 2   Hypertension    IBS (irritable bowel syndrome)    Osteopenia 2018   Rectocele    Urethral pain    mass   Urinary incontinence    VAIN II (vaginal intraepithelial neoplasia grade II) dx 08/2015   right and left labia minora. This is really VIN II and not VAIN II.    Wears contact lenses    Past Surgical History:  Procedure Laterality Date   ABDOMINAL SACROCOLPOPEXY N/A 03/28/2016   Procedure: ABDOMINO SACROCOLPOPEXY;  Surgeon: Greta Leatherwood, MD;  Location: [facility];  Service: Gynecology;  Laterality: N/A;   ANAL RECTAL MANOMETRY N/A 08/09/2022   Procedure: ANO RECTAL MANOMETRY;  Surgeon: Tobin Forts, MD;  Location: [facility];  Service: Gastroenterology;  Laterality: N/A;   ANTERIOR AND POSTERIOR REPAIR N/A 03/28/2016   Procedure: ANTERIOR (CYSTOCELE) AND POSTERIOR REPAIR (RECTOCELE);  Surgeon: Greta Leatherwood, MD;  Location: [facility];  Service: Gynecology;  Laterality: N/A;   AUGMENTATION MAMMAPLASTY  APRIL 2013   BREAST IMPLANT CHANGED   BILATERAL SALPINGECTOMY Bilateral 03/28/2016   Procedure: BILATERAL SALPINGECTOMY;  Surgeon: Greta Leatherwood, MD;  Location: [facility];  Service: Gynecology;  Laterality: Bilateral;   BLADDER SUSPENSION N/A 03/28/2016   Procedure: TRANSVAGINAL TAPE (TVT) PROCEDURE exact midurethral sling;  Surgeon: Greta Leatherwood, MD;  Location: [facility];  Service: Gynecology;  Laterality: N/A;   CATARACT EXTRACTION W/PHACO Right 08/10/2023    Procedure: PHACOEMULSIFICATION, CATARACT;  Surgeon: Ardeth Krabbe, MD;  Location: [facility];  Service: Ophthalmology;  Laterality: Right;  CDE: 6.32   COLONOSCOPY     CYSTOSCOPY N/A 03/28/2016   Procedure: CYSTOSCOPY;  Surgeon: Greta Leatherwood, MD;  Location: [facility];  Service: Gynecology;  Laterality: N/A;   CYSTOSCOPY WITH BIOPSY Bilateral 11/03/2015   Procedure: CYSTOSCOPY WITH URETHRAL BIOPSY, FULGERATION, BILATERAL RETROGRADE PYELOGRAMS;  Surgeon: Osborn Blaze, MD;  Location: [facility];  Service: Urology;  Laterality: Bilateral;   GAS INSERTION Right 03/22/2023   Procedure: INSERTION OF GAS;  Surgeon: [name], MD;  Location: [facility];  Service: Ophthalmology;  Laterality: Right;   INSERTION, STENT, DRUG-ELUTING, LACRIMAL CANALICULUS Right 08/10/2023   Procedure: INSERTION, STENT, DRUG-ELUTING, LACRIMAL CANALICULUS;  Surgeon: Ardeth Krabbe, MD;  Location: [facility];  Service: Ophthalmology;  Laterality: Right;  LYSIS OF ADHESION N/A 03/28/2016   Procedure: LYSIS OF ADHESION;  Surgeon: Greta Leatherwood, MD;  Location: [facility];  Service: Gynecology;  Laterality: N/A;   MASS EXCISION N/A 11/03/2015   Procedure: EXCISION  URETHRAL MASS;  Surgeon: Osborn Blaze, MD;  Location: [facility];  Service: Urology;  Laterality: N/A;   PARS PLANA VITRECTOMY W/REMOVE OF INTERNAL LIMITING MEMBRANE W/TAMPONADE Right 08/23/2023   Procedure: PARS PLANA VITRECTOMY WITH 25 GAUGE WITH REMOVAL OF INTERNAL LIMITING MEMBRANE OF RETINA WITH INTRAOCULAR TAMPONADE;  Surgeon: [name], MD;  Location: [facility] OR;  Service: Ophthalmology;  Laterality: Right;   REMOVAL RETAINED LENS Right 08/23/2023   Procedure: REMOVAL, RETAINED LENS MATTER;  Surgeon: [name], MD;  Location: [facility] OR;  Service: Ophthalmology;  Laterality: Right;   SCLERAL BUCKLE Right 03/22/2023   Procedure: SCLERAL BUCKLE;  Surgeon: [name], MD;  Location: [facility] OR;  Service: Ophthalmology;  Laterality:  Right;   TUBAL LIGATION     VAGINAL HYSTERECTOMY  1999   VITRECTOMY 25 GAUGE WITH SCLERAL BUCKLE Right 03/22/2023   Procedure: VITRECTOMY 25 GAUGE;  Surgeon: [name], MD;  Location: [facility];  Service: Ophthalmology;  Laterality: Right;   FAMILY HISTORY Family History  Problem Relation Age of Onset   Hypertension Mother    Osteoporosis Mother    Paget's disease of bone Mother    Heart failure Mother    Cancer Father        MELANOMA   Diabetes Father    Hypertension Father    Heart disease Father    Cancer Sister        THYROID    Breast cancer Paternal Grandmother    Colon cancer Neg Hx    Pancreatic cancer Neg Hx    Esophageal cancer Neg Hx    SOCIAL HISTORY Social History   Tobacco Use   Smoking status: Never   Smokeless tobacco: Never  Vaping Use   Vaping status: Never Used  Substance Use Topics   Alcohol use: Not Currently   Drug use: No       OPHTHALMIC EXAM:  Base Eye Exam     Visual Acuity (Snellen - Linear)       Right Left   Dist [place] CF at 3'    Dist cc  20/30   Dist ph [place] NI    Dist ph cc  NI         Tonometry (Tonopen, 8:00 AM)       Right Left   Pressure 20 17         Pupils       Dark Light Shape React APD   Right Dilated       Left 3 2 Round Brisk None         Visual Fields       Left Right    Full          Extraocular Movement       Right Left    Full, Ortho Full, Ortho         Neuro/Psych     Oriented x3: Yes   Mood/Affect: Normal         Dilation     Both eyes: 1.0% Mydriacyl , 2.5% Phenylephrine  @ 7:56 AM           Slit Lamp and Fundus Exam     Slit Lamp Exam       Right Left   Lids/Lashes Dermatochalasis - upper lid, mild  MGD Dermatochalasis - upper lid, mild MGD   Conjunctiva/Sclera Subconjunctival hemorrhage, sutures intact, 1+ Injection White and quiet   Cornea arcus; well healed cataract wound, tear film debris 1+ Punctate epithelial erosions   Anterior Chamber Deep, 2+cell and  pigment deep and clear   Iris Round and dilated, defect at 0130 Round and dilated   Lens Aphakia 2-3+ Nuclear sclerosis, 2-3+ Cortical cataract   Anterior Vitreous post vitrectomy, mild cell and pigment syneresis, fine pigment, Posterior vitreous detachment, vitreous condensations         Fundus Exam       Right Left   Disc mild Pallor, Sharp rim, Compact Pink and Sharp, Compact   C/D Ratio 0.2 0.1   Macula Flat, Blunted foveal reflex, ERM / PRF gone, residual central thickening improving, punctate heme IT mac -- improving Flat, Blunted foveal reflex, RPE mottling and clumping, No heme or edema   Vessels no view, attenuated, Tortuous attenuated, Tortuous   Periphery Attached over buckle, good buckle height, good laser over buckle and around tears; PRE:OP, Bullous temporal detachment from 0800-1030 with bi-lobed HST at 1030 Attached, No heme, mild paving stone degeneration inferiorly, mild reticular degeneration           Refraction     Wearing Rx       Sphere Cylinder Axis Add   Right +1.50 +0.50 167 +2.25   Left +1.75 +0.50 009 +2.25    Type: Progressive         Manifest Refraction       Sphere Cylinder Dist VA   Right +8.75 Sphere 20/80+2   Left              IMAGING AND PROCEDURES  Imaging and Procedures for 08/30/2023  OCT, Retina - OU - Both Eyes        Right Eye Quality was good. Central Foveal Thickness: 393. Progression has improved. Findings include no IRF, no SRF, abnormal foveal contour (ERM and PRF gone, interval improvement in central thickening and pucker, patchy central ORA).   Left Eye Quality was good. Central Foveal Thickness: 271. Progression has been stable. Findings include normal foveal contour, no IRF, no SRF (Partial PVD).   Notes  *Images captured and stored on drive  Diagnosis / Impression:  OD: ERM and PRF gone, interval improvement in central thickening and pucker, patchy central ORA OS: NFP; no IRF/SRF   Clinical  management:  See below  Abbreviations: NFP - Normal foveal profile. CME - cystoid macular edema. PED - pigment epithelial detachment. IRF - intraretinal fluid. SRF - subretinal fluid. EZ - ellipsoid zone. ERM - epiretinal membrane. ORA - outer retinal atrophy. ORT - outer retinal tubulation. SRHM - subretinal hyper-reflective material. IRHM - intraretinal hyper-reflective material           ASSESSMENT/PLAN:   ICD-10-CM   1. Retained lens material following cataract surgery, right eye  H59.021     2. Right retinal detachment  H33.21 OCT, Retina - OU - Both Eyes    3. Epiretinal membrane (ERM) of right eye  H35.371 OCT, Retina - OU - Both Eyes    4. Essential hypertension  I10     5. Hypertensive retinopathy of both eyes  H35.033     6. Combined forms of age-related cataract of left eye  H25.812     7. Aphakia of right eye  H27.01      1. Retained lens material OD  - Pt underwent phaco on 04.25.25 - complicated by posterior  capsule rupture and dislocation of lens into the vitreous  - pt left aphakic  - pre op BCVA OD HM  - now POW1 s/p PPV/removal of retained lens material/tissue blue/MP OD, 05.08.25             - doing well   - retained lens material removed             - retina attached and in good position, ERM gone              - IOP 20             - cont PF 6x/day OD                          zymaxid  QID OD -- stop on Saturday, May 17th                         Atropine  BID OD --okay to stop now                         erythromycin ung QID OD -- decrease to BID / PRN             - eye shield when sleeping x 1 more week             - post op drop and positioning instructions reviewed              - f/u 2 weeks, DFE/OCT  2. 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 Epiretinal membrane, right eye  - original exam showed dense ERM with central thickening and pre retinal fibrosis - now POW1 s/p PPV/removal of retained lens material/tissue blue/MP OD, 05.08.25 as above - OCT shows ERM and PRF gone, interval improvement in central thickening and pucker, patchy central ORA - post op drops as above  4,5. Hypertensive retinopathy OU - discussed importance of tight BP control - monitor  6. Mixed Cataract OS - The symptoms of cataract, surgical options, and treatments and risks were discussed with patient. - discussed diagnosis and progression - monitor  7. Aphakia OD  - discussed options once healed from this most recent surgery  Ophthalmic Meds Ordered this visit:  Meds ordered this encounter  Medications   prednisoLONE  acetate (PRED FORTE ) 1 % ophthalmic suspension    Sig: Place 1 drop into the right eye 6 (six) times daily.    Dispense:  15 mL    Refill:  0     Return in about 2 weeks (around 09/13/2023) for f/u retained lens material OD, DFE, OCT.  There are no Patient Instructions on file for this visit.  Explained the diagnoses, plan, and follow up with the patient and they expressed understanding.  Patient expressed understanding of the importance of proper follow up care.   This document serves as a record of services personally performed by Jeanice Millard, MD, PhD. It was created on  their behalf by Diona Franklin, COMT. The creation of this record is the provider's dictation and/or activities during the visit.  Electronically signed by: Diona Franklin, COMT 08/30/23 12:32 PM  This document serves as a record of services personally performed by Jeanice Millard, MD, PhD. It was created on their behalf by Morley Arabia. Bevin Bucks, OA an ophthalmic technician. The creation of this record is the provider's dictation and/or  activities during the visit.    Electronically signed by: Morley Arabia. Bevin Bucks, OA 08/30/23 12:32 PM  Jeanice Millard, M.D., Ph.D. Diseases & Surgery of the Retina and Vitreous [facility]  I have reviewed the above documentation for accuracy and completeness, and I agree with the above. Jeanice Millard, M.D., Ph.D. 08/30/23 12:32 PM   Abbreviations: M myopia (nearsighted); A astigmatism; H hyperopia (farsighted); P presbyopia; Mrx spectacle prescription;  CTL contact lenses; OD right eye; OS left eye; OU both eyes  XT exotropia; ET esotropia; PEK punctate epithelial keratitis; PEE punctate epithelial erosions; DES dry eye syndrome; MGD meibomian gland dysfunction; ATs artificial tears; PFAT's preservative free artificial tears; NSC nuclear sclerotic cataract; PSC posterior subcapsular cataract; ERM epi-retinal membrane; PVD posterior vitreous detachment; RD retinal detachment; DM diabetes mellitus; DR diabetic retinopathy; NPDR non-proliferative diabetic retinopathy; PDR proliferative diabetic retinopathy; CSME clinically significant macular edema; DME diabetic macular edema; dbh dot blot hemorrhages; CWS cotton wool spot; POAG primary open angle glaucoma; C/D cup-to-disc ratio; HVF humphrey visual field; GVF goldmann visual field; OCT optical coherence tomography; IOP intraocular pressure; BRVO Branch retinal vein occlusion; CRVO central retinal vein occlusion; CRAO central retinal artery occlusion; BRAO branch retinal artery occlusion; RT retinal tear; SB scleral buckle; PPV pars plana vitrectomy; VH Vitreous hemorrhage; PRP panretinal laser photocoagulation; IVK intravitreal kenalog ; VMT vitreomacular traction; MH Macular hole;  NVD neovascularization of the disc; NVE neovascularization elsewhere; AREDS age related eye disease study; ARMD age related macular degeneration; POAG primary open angle glaucoma; EBMD epithelial/anterior basement membrane dystrophy; ACIOL anterior chamber  intraocular lens; IOL intraocular lens; PCIOL posterior chamber intraocular lens; Phaco/IOL phacoemulsification with intraocular lens placement; PRK photorefractive keratectomy; LASIK laser assisted in situ keratomileusis; HTN hypertension; DM diabetes mellitus; COPD chronic obstructive pulmonary disease

## 2023-08-30 ENCOUNTER — Encounter (INDEPENDENT_AMBULATORY_CARE_PROVIDER_SITE_OTHER): Payer: Self-pay | Admitting: Ophthalmology

## 2023-08-30 ENCOUNTER — Ambulatory Visit (INDEPENDENT_AMBULATORY_CARE_PROVIDER_SITE_OTHER): Admitting: Ophthalmology

## 2023-08-30 DIAGNOSIS — H35371 Puckering of macula, right eye: Secondary | ICD-10-CM | POA: Diagnosis not present

## 2023-08-30 DIAGNOSIS — H3321 Serous retinal detachment, right eye: Secondary | ICD-10-CM

## 2023-08-30 DIAGNOSIS — H59021 Cataract (lens) fragments in eye following cataract surgery, right eye: Secondary | ICD-10-CM

## 2023-08-30 DIAGNOSIS — H25812 Combined forms of age-related cataract, left eye: Secondary | ICD-10-CM

## 2023-08-30 DIAGNOSIS — H35033 Hypertensive retinopathy, bilateral: Secondary | ICD-10-CM

## 2023-08-30 DIAGNOSIS — H2701 Aphakia, right eye: Secondary | ICD-10-CM

## 2023-08-30 DIAGNOSIS — I10 Essential (primary) hypertension: Secondary | ICD-10-CM

## 2023-08-30 MED ORDER — PREDNISOLONE ACETATE 1 % OP SUSP: 1.0000 [drp] | Freq: Every day | OPHTHALMIC | 0 refills | Status: DC

## 2023-09-12 NOTE — Progress Notes (Signed)
 [facility] - Clinic Note  09/13/2023   CHIEF COMPLAINT Patient presents for Retina Follow Up  HISTORY OF PRESENT ILLNESS: Stephanie Armstrong is a 68 y.o. female who presents to the clinic today for:  HPI     Retina Follow Up   In right eye.  This started 2 weeks ago.  Duration of 2 weeks.  Since onset it is stable.  I, the attending physician,  performed the HPI with the patient and updated documentation appropriately.        Comments   2 week retina follow up removal of retained lens material/tissue pt is reporting vision seems about the same  she denies any flashes or floaters she is using PF 6x/day OD      Last edited by [name], MD on 09/13/2023 10:43 AM.     Pt states she feels like she doesn't have any vision in her right eye, she's using PF 6x/day and ointment as needed  Referring physician: Kathyleen Parkins, MD [address] [place],  [place] [address]  HISTORICAL INFORMATION:  Selected notes from the MEDICAL RECORD NUMBER Referred by Dr. Barbra Boone for concern of macular hole OD LEE:  Ocular Hx- PMH-   CURRENT MEDICATIONS: Current Outpatient Medications (Ophthalmic Drugs)  Medication Sig   Bromfenac  Sodium 0.07 % SOLN Place 1 drop into the right eye 4 (four) times daily.   brimonidine  (ALPHAGAN ) 0.2 % ophthalmic solution Place 1 drop into the right eye in the morning and at bedtime.   erythromycin  ophthalmic ointment Place 1 Application into the right eye 4 (four) times daily.   prednisoLONE  acetate (PRED FORTE ) 1 % ophthalmic suspension Place 1 drop into the right eye 6 (six) times daily.   timolol  (TIMOPTIC ) 0.5 % ophthalmic solution Place 1 drop into the right eye 2 (two) times daily.   No current facility-administered medications for this visit. (Ophthalmic Drugs)   Current Outpatient Medications (Other)  Medication Sig   acetaZOLAMIDE  ER (DIAMOX ) 500 MG capsule Take 500 mg by mouth 2 (two) times daily.   ALPRAZolam  (XANAX ) 0.5  MG tablet Take 1 tab p.o. qhs for 2 weeks, then take 1/2 tab p.o. qhs, then take 1/2 tab qod, then discontinue. (Patient taking differently: Take 0.25 mg by mouth 2 (two) times daily as needed for anxiety.)   amLODipine-benazepril (LOTREL) 5-40 MG capsule TAKE (1) CAPSULE BY MOUTH ONCE DAILY.   Cholecalciferol (VITAMIN D3) 125 MCG (5000 UT) CAPS Take 5,000 Units by mouth daily.   diphenoxylate-atropine  (LOMOTIL) 2.5-0.025 MG tablet TAKE 2 TABLETS BY MOUTH 4 TIMES DAILY AS NEEDED FOR DIARRHEA OR LOOSE STOOLS. MAX 8 TABLETS IN 24 HOURS.   estradiol  (ESTRACE ) 0.1 MG/GM vaginal cream Place 1/2 gram vaginally at hs two to three times per week as needed. (Patient taking differently: Place 0.5 Applicatorfuls vaginally 2 (two) times a week.)   fluorouracil (EFUDEX) 5 % cream Apply 1 Application topically daily as needed (skin cancer issue).   hydrochlorothiazide (HYDRODIURIL) 25 MG tablet TAKE ONE TABLET BY MOUTH ONCE DAILY AS NEEDED. DO NOT EXCEED 3 PER WEEK. APPT REQUIRED FOR FUTURE REFILLS (Patient taking differently: Take 25 mg by mouth 3 (three) times a week.  DO NOT EXCEED 3 PER WEEK. APPT REQUIRED FOR FUTURE REFILLS)   ibuprofen  (ADVIL ) 800 MG tablet Take 1 tablet (800 mg total) by mouth every 8 (eight) hours as needed.   pantoprazole (PROTONIX) 40 MG tablet Take 40 mg by mouth daily as needed (acid reflux).   Probiotic,  Lactobacillus, CAPS Take 1 capsule by mouth daily.   Semaglutide,0.25 or 0.5MG /DOS, (OZEMPIC, 0.25 OR 0.5 MG/DOSE,) 2 MG/1.5ML SOPN Inject 0.5 mg into the skin once a week.   tretinoin (RETIN-A) 0.05 % cream Apply 1 Application topically 3 (three) times a week.   TRULANCE  3 MG TABS TAKE 1 TABLET BY MOUTH DAILY (Patient taking differently: Take 3 mg by mouth 2 (two) times a week.)   zolpidem (AMBIEN) 10 MG tablet Take 10 mg by mouth at bedtime.   No current facility-administered medications for this visit. (Other)   REVIEW OF SYSTEMS: ROS   Positive for: Constitutional,  Gastrointestinal, Eyes Negative for: Neurological, Skin, Genitourinary, Musculoskeletal, HENT, Endocrine, Cardiovascular, Respiratory, Psychiatric, Allergic/Imm, Heme/Lymph Last edited by Alise Appl, COT on 09/13/2023  8:38 AM.      ALLERGIES Allergies  Allergen Reactions   Augmentin [Amoxicillin-Pot Clavulanate] Diarrhea   Codeine Nausea Only   PAST MEDICAL HISTORY Past Medical History:  Diagnosis Date   Allergy    Anxiety    Arthritis    fingers   ASCUS with positive high risk human papillomavirus of vagina dx 10/11/15   Cancer (HCC)    basal cell skin   Hepatitis    age 51 , unknown type   History of vulvar dysplasia    VIN 2   Hypertension    IBS (irritable bowel syndrome)    Osteopenia 2018   Rectocele    Urethral pain    mass   Urinary incontinence    VAIN II (vaginal intraepithelial neoplasia grade II) dx 08/2015   right and left labia minora. This is really VIN II and not VAIN II.    Wears contact lenses    Past Surgical History:  Procedure Laterality Date   ABDOMINAL SACROCOLPOPEXY N/A 03/28/2016   Procedure: ABDOMINO SACROCOLPOPEXY;  Surgeon: Greta Leatherwood, MD;  Location: [facility];  Service: Gynecology;  Laterality: N/A;   ANAL RECTAL MANOMETRY N/A 08/09/2022   Procedure: ANO RECTAL MANOMETRY;  Surgeon: Tobin Forts, MD;  Location: [facility];  Service: Gastroenterology;  Laterality: N/A;   ANTERIOR AND POSTERIOR REPAIR N/A 03/28/2016   Procedure: ANTERIOR (CYSTOCELE) AND POSTERIOR REPAIR (RECTOCELE);  Surgeon: Greta Leatherwood, MD;  Location: [facility];  Service: Gynecology;  Laterality: N/A;   AUGMENTATION MAMMAPLASTY  APRIL 2013   BREAST IMPLANT CHANGED   BILATERAL SALPINGECTOMY Bilateral 03/28/2016   Procedure: BILATERAL SALPINGECTOMY;  Surgeon: Greta Leatherwood, MD;  Location: [facility];  Service: Gynecology;  Laterality: Bilateral;   BLADDER SUSPENSION N/A 03/28/2016   Procedure: TRANSVAGINAL TAPE (TVT) PROCEDURE exact  midurethral sling;  Surgeon: Greta Leatherwood, MD;  Location: [facility];  Service: Gynecology;  Laterality: N/A;   CATARACT EXTRACTION W/PHACO Right 08/10/2023   Procedure: PHACOEMULSIFICATION, CATARACT;  Surgeon: Ardeth Krabbe, MD;  Location: [facility];  Service: Ophthalmology;  Laterality: Right;  CDE: 6.32   COLONOSCOPY     CYSTOSCOPY N/A 03/28/2016   Procedure: CYSTOSCOPY;  Surgeon: Greta Leatherwood, MD;  Location: [facility];  Service: Gynecology;  Laterality: N/A;   CYSTOSCOPY WITH BIOPSY Bilateral 11/03/2015   Procedure: CYSTOSCOPY WITH URETHRAL BIOPSY, FULGERATION, BILATERAL RETROGRADE PYELOGRAMS;  Surgeon: Osborn Blaze, MD;  Location[facility];  Service: Urology;  Laterality: Bilateral;   GAS INSERTION Right 03/22/2023   Procedure: INSERTION OF GAS;  Surgeon: [name], MD;  Location[facility];  Service: Ophthalmology;  Laterality: Right;   INSERTION, STENT, DRUG-ELUTING, LACRIMAL  CANALICULUS Right 08/10/2023   Procedure: INSERTION, STENT, DRUG-ELUTING, LACRIMAL CANALICULUS;  Surgeon: Ardeth Krabbe, MD;  Location: [facility];  Service: Ophthalmology;  Laterality: Right;   LYSIS OF ADHESION N/A 03/28/2016   Procedure: LYSIS OF ADHESION;  Surgeon: Greta Leatherwood, MD;  Location: [facility];  Service: Gynecology;  Laterality: N/A;   MASS EXCISION N/A 11/03/2015   Procedure: EXCISION  URETHRAL MASS;  Surgeon: Osborn Blaze, MD;  Location[facility];  Service: Urology;  Laterality: N/A;   PARS PLANA VITRECTOMY W/REMOVE OF INTERNAL LIMITING MEMBRANE W/TAMPONADE Right 08/23/2023   Procedure: PARS PLANA VITRECTOMY WITH 25 GAUGE WITH REMOVAL OF INTERNAL LIMITING MEMBRANE OF RETINA WITH INTRAOCULAR TAMPONADE;  Surgeon: [name], MD;  Location: [facility] OR;  Service: Ophthalmology;  Laterality: Right;   REMOVAL RETAINED LENS Right 08/23/2023   Procedure: REMOVAL, RETAINED LENS MATTER;  Surgeon: [name], MD;  Location: [facility] OR;  Service: Ophthalmology;   Laterality: Right;   SCLERAL BUCKLE Right 03/22/2023   Procedure: SCLERAL BUCKLE;  Surgeon: [name], MD;  Location: [facility] OR;  Service: Ophthalmology;  Laterality: Right;   TUBAL LIGATION     VAGINAL HYSTERECTOMY  1999   VITRECTOMY 25 GAUGE WITH SCLERAL BUCKLE Right 03/22/2023   Procedure: VITRECTOMY 25 GAUGE;  Surgeon: [name], MD;  Location: [facility] OR;  Service: Ophthalmology;  Laterality: Right;   FAMILY HISTORY Family History  Problem Relation Age of Onset   Hypertension Mother    Osteoporosis Mother    Paget's disease of bone Mother    Heart failure Mother    Cancer Father        MELANOMA   Diabetes Father    Hypertension Father    Heart disease Father    Cancer Sister        THYROID    Breast cancer Paternal Grandmother    Colon cancer Neg Hx    Pancreatic cancer Neg Hx    Esophageal cancer Neg Hx    SOCIAL HISTORY Social History   Tobacco Use   Smoking status: Never   Smokeless tobacco: Never  Vaping Use   Vaping status: Never Used  Substance Use Topics   Alcohol use: Not Currently   Drug use: No       OPHTHALMIC EXAM:  Base Eye Exam     Visual Acuity (Snellen - Linear)       Right Left   Dist [place] CF at 3'    Dist cc  20/30 -2   Dist ph [place] 20/350 -1          Tonometry (Tonopen, 8:43 AM)       Right Left   Pressure 12 12         Pupils       Pupils Dark Light Shape React APD   Right PERRL 5 5 Irregular NR    Left PERRL 3 2 Round Brisk None         Visual Fields       Left Right    Full Full         Extraocular Movement       Right Left    Full, Ortho Full, Ortho         Neuro/Psych     Oriented x3: Yes   Mood/Affect: Normal         Dilation     Right eye: 2.5% Phenylephrine  @ 8:43 AM           Slit Lamp and Fundus  Exam     Slit Lamp Exam       Right Left   Lids/Lashes Dermatochalasis - upper lid Dermatochalasis - upper lid, mild MGD   Conjunctiva/Sclera White and quiet, sutures dissolving White and  quiet   Cornea Mild arcus; well healed cataract wound, 2+ Punctate epithelial erosions 1+ Punctate epithelial erosions   Anterior Chamber Deep, 0.5+cell and pigment deep and clear   Iris Round and dilated, defect at 0130 Round and dilated   Lens Aphakia 2-3+ Nuclear sclerosis, 2-3+ Cortical cataract   Anterior Vitreous post vitrectomy, trace cell and pigment syneresis, fine pigment, Posterior vitreous detachment, vitreous condensations         Fundus Exam       Right Left   Disc mild Pallor, Sharp rim, Compact Pink and Sharp, Compact   C/D Ratio 0.2 0.1   Macula Flat, Blunted foveal reflex, ERM / PRF gone, residual central thickening improving, punctate heme IT mac -- improved, ?trace cystic changes nasal mac Flat, Blunted foveal reflex, RPE mottling and clumping, No heme or edema   Vessels attenuated, Tortuous attenuated, Tortuous   Periphery Attached over buckle, good buckle height, good laser over buckle and around tears; PRE:OP, Bullous temporal detachment from 0800-1030 with bi-lobed HST at 1030 Attached, No heme, mild paving stone degeneration inferiorly, mild reticular degeneration           Refraction     Wearing Rx       Sphere Cylinder Axis Add   Right +1.50 +0.50 167 +2.25   Left +1.75 +0.50 009 +2.25    Type: Progressive           IMAGING AND PROCEDURES  Imaging and Procedures for 09/13/2023  OCT, Retina - OU - Both Eyes        Right Eye Quality was good. Central Foveal Thickness: 388. Progression has improved. Findings include no IRF, no SRF, abnormal foveal contour (ERM and PRF gone, interval improvement in central thickening and pucker, patchy central ORA, focal early cystic changes nasal macula).   Left Eye Quality was good. Central Foveal Thickness: 279. Progression has been stable. Findings include normal foveal contour, no IRF, no SRF (Partial PVD).   Notes  *Images captured and stored on drive  Diagnosis / Impression:  OD: ERM and PRF gone,  interval improvement in central thickening and pucker, patchy central ORA, focal early cystic changes nasal macula OS: NFP; no IRF/SRF   Clinical management:  See below  Abbreviations: NFP - Normal foveal profile. CME - cystoid macular edema. PED - pigment epithelial detachment. IRF - intraretinal fluid. SRF - subretinal fluid. EZ - ellipsoid zone. ERM - epiretinal membrane. ORA - outer retinal atrophy. ORT - outer retinal tubulation. SRHM - subretinal hyper-reflective material. IRHM - intraretinal hyper-reflective material           ASSESSMENT/PLAN:   ICD-10-CM   1. Retained lens material following cataract surgery, right eye  H59.021     2. Right retinal detachment  H33.21     3. Epiretinal membrane (ERM) of right eye  H35.371 OCT, Retina - OU - Both Eyes    4. Essential hypertension  I10     5. Hypertensive retinopathy of both eyes  H35.033     6. Combined forms of age-related cataract of left eye  H25.812     7. Aphakia of right eye  H27.01      1. Retained lens material OD  - Pt underwent phaco on 04.25.25 - complicated by posterior  capsule rupture and dislocation of lens into the vitreous  - pt left aphakic  - pre op BCVA OD HM  - now POW3 s/p PPV/removal of retained lens material/tissue blue/MP OD, 05.08.25             - doing well   - retained lens material removed             - retina attached and in good position, ERM gone              - IOP 12  - OCT shows focal early cystic changes nasal macula -- ?early CME             - cont PF 6x/day OD -- decrease to QID                         erythromycin  ung PRN OD  - add bromfenac  QID OD for possible CME component             - post op drop and positioning instructions reviewed              - f/u 4 weeks, DFE/OCT  2. 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 Epiretinal membrane, right eye  - original exam showed dense ERM with central thickening and pre retinal fibrosis - now POW3 s/p PPV/removal of retained lens material/tissue blue/MP OD, 05.08.25 as above - OCT shows ERM and PRF gone, interval improvement in central thickening and pucker, patchy central ORA, focal early cystic changes nasal macula  - post-op drops as above  4,5. Hypertensive retinopathy OU - discussed importance of tight BP control - monitor  6. Mixed Cataract OS - The symptoms of cataract, surgical options, and treatments and risks were discussed with patient. - discussed diagnosis and progression - monitor  7. Aphakia OD  - discussed options once healed from this most recent surgery  Ophthalmic Meds Ordered this visit:  Meds ordered this encounter  Medications   Bromfenac  Sodium 0.07 % SOLN    Sig: Place 1 drop into the right eye 4 (four) times daily.    Dispense:  3 mL    Refill:  6     Return in about 4 weeks (around 10/11/2023) for f/u retained lens material OD, DFE, OCT.  There are no Patient Instructions on file for this visit.  Explained the diagnoses, plan, and follow up with the patient and they expressed understanding.  Patient expressed understanding of the importance of proper follow up care.   This document serves as a record of services personally performed by Jeanice Millard, MD, PhD. It was created on their behalf by Diona Franklin, COMT. The creation of this record is the provider's dictation and/or activities during the visit.  Electronically signed by: Diona Franklin, COMT 09/13/23 10:44 AM  This document serves as a record of services personally performed by Jeanice Millard, MD, PhD. It was created on their behalf by Morley Arabia. Bevin Bucks, OA an ophthalmic technician. The  creation of this record is the provider's dictation and/or activities during the visit.    Electronically signed by: Morley Arabia. Bevin Bucks, OA 09/13/23 10:44 AM  Jeanice Millard, M.D., Ph.D. Diseases & Surgery of the Retina and Vitreous [facility]  I have reviewed the above documentation for accuracy and completeness, and I agree with the above. Jeanice Millard, M.D., Ph.D. 09/13/23 10:47 AM   Abbreviations: M myopia (nearsighted); A astigmatism; H hyperopia (farsighted); P presbyopia; Mrx spectacle prescription;  CTL contact lenses; OD right eye; OS left eye; OU both eyes  XT exotropia; ET esotropia; PEK punctate epithelial keratitis; PEE punctate epithelial erosions; DES dry eye syndrome; MGD meibomian gland dysfunction; ATs artificial tears; PFAT's preservative free artificial tears; NSC nuclear sclerotic cataract; PSC posterior subcapsular cataract; ERM epi-retinal membrane; PVD posterior vitreous detachment; RD retinal detachment; DM diabetes mellitus; DR diabetic retinopathy; NPDR non-proliferative diabetic retinopathy; PDR proliferative diabetic retinopathy; CSME clinically significant macular edema; DME diabetic macular edema; dbh dot blot hemorrhages; CWS cotton wool spot; POAG primary open angle glaucoma; C/D cup-to-disc ratio; HVF humphrey visual field; GVF goldmann visual field; OCT optical coherence tomography; IOP intraocular pressure; BRVO Branch retinal vein occlusion; CRVO central retinal vein occlusion; CRAO central retinal artery occlusion; BRAO branch retinal artery occlusion; RT retinal tear; SB scleral buckle; PPV pars plana vitrectomy; VH Vitreous hemorrhage; PRP panretinal laser photocoagulation; IVK intravitreal kenalog ; VMT vitreomacular traction; MH Macular hole;  NVD neovascularization of the disc; NVE neovascularization elsewhere; AREDS age related eye disease study; ARMD age related macular degeneration; POAG primary open angle glaucoma; EBMD  epithelial/anterior basement membrane dystrophy; ACIOL anterior chamber intraocular lens; IOL intraocular lens; PCIOL posterior chamber intraocular lens; Phaco/IOL phacoemulsification with intraocular lens placement; PRK photorefractive keratectomy; LASIK laser assisted in situ keratomileusis; HTN hypertension; DM diabetes mellitus; COPD chronic obstructive pulmonary disease

## 2023-09-13 ENCOUNTER — Encounter (INDEPENDENT_AMBULATORY_CARE_PROVIDER_SITE_OTHER): Payer: Self-pay | Admitting: Ophthalmology

## 2023-09-13 ENCOUNTER — Ambulatory Visit (INDEPENDENT_AMBULATORY_CARE_PROVIDER_SITE_OTHER): Admitting: Ophthalmology

## 2023-09-13 DIAGNOSIS — H35033 Hypertensive retinopathy, bilateral: Secondary | ICD-10-CM | POA: Diagnosis not present

## 2023-09-13 DIAGNOSIS — H25812 Combined forms of age-related cataract, left eye: Secondary | ICD-10-CM

## 2023-09-13 DIAGNOSIS — H3321 Serous retinal detachment, right eye: Secondary | ICD-10-CM

## 2023-09-13 DIAGNOSIS — I10 Essential (primary) hypertension: Secondary | ICD-10-CM

## 2023-09-13 DIAGNOSIS — H2701 Aphakia, right eye: Secondary | ICD-10-CM

## 2023-09-13 DIAGNOSIS — H59021 Cataract (lens) fragments in eye following cataract surgery, right eye: Secondary | ICD-10-CM | POA: Diagnosis not present

## 2023-09-13 DIAGNOSIS — H35371 Puckering of macula, right eye: Secondary | ICD-10-CM

## 2023-09-13 MED ORDER — BROMFENAC SODIUM 0.07 % OP SOLN: 1.0000 [drp] | Freq: Four times a day (QID) | OPHTHALMIC | 6 refills | Status: DC

## 2023-10-08 ENCOUNTER — Encounter (INDEPENDENT_AMBULATORY_CARE_PROVIDER_SITE_OTHER): Admitting: Ophthalmology

## 2023-10-10 DIAGNOSIS — S336XXA Sprain of sacroiliac joint, initial encounter: Secondary | ICD-10-CM | POA: Diagnosis not present

## 2023-10-10 DIAGNOSIS — M9902 Segmental and somatic dysfunction of thoracic region: Secondary | ICD-10-CM | POA: Diagnosis not present

## 2023-10-10 DIAGNOSIS — S134XXA Sprain of ligaments of cervical spine, initial encounter: Secondary | ICD-10-CM | POA: Diagnosis not present

## 2023-10-10 DIAGNOSIS — M9903 Segmental and somatic dysfunction of lumbar region: Secondary | ICD-10-CM | POA: Diagnosis not present

## 2023-10-10 DIAGNOSIS — S233XXA Sprain of ligaments of thoracic spine, initial encounter: Secondary | ICD-10-CM | POA: Diagnosis not present

## 2023-10-10 DIAGNOSIS — M9901 Segmental and somatic dysfunction of cervical region: Secondary | ICD-10-CM | POA: Diagnosis not present

## 2023-10-11 NOTE — Progress Notes (Signed)
 [facility] - Clinic Note  10/12/2023   CHIEF COMPLAINT Patient presents for Retina Follow Up  HISTORY OF PRESENT ILLNESS: Stephanie Armstrong is a 68 y.o. female who presents to the clinic today for:  HPI     Retina Follow Up   Patient presents with  Other.  In right eye.  This started 4 weeks ago.  I, the attending physician,  performed the HPI with the patient and updated documentation appropriately.        Comments   Patient here for 4 weeks retina follow up for retained lens material OD. Patient states OD may be slightly better. No worse. OS same. No eye pain.       Last edited by Valdemar Rogue, MD on 10/12/2023  4:19 PM.    Pt states she she has been using PF and bromfenac  about 3x/day, she has been helping a friend move so she hasn't been getting them in as much as she would like  Referring physician: Bertell Satterfield, MD [address] [place],  [place] [address]  HISTORICAL INFORMATION:  Selected notes from the MEDICAL RECORD NUMBER Referred by Dr. Darroll for concern of macular hole OD LEE:  Ocular Hx- PMH-   CURRENT MEDICATIONS: Current Outpatient Medications (Ophthalmic Drugs)  Medication Sig   brimonidine  (ALPHAGAN ) 0.2 % ophthalmic solution Place 1 drop into the right eye in the morning and at bedtime.   Bromfenac  Sodium 0.07 % SOLN Place 1 drop into the right eye 4 (four) times daily.   erythromycin  ophthalmic ointment Place 1 Application into the right eye 4 (four) times daily.   prednisoLONE  acetate (PRED FORTE ) 1 % ophthalmic suspension Place 1 drop into the right eye 6 (six) times daily.   timolol  (TIMOPTIC ) 0.5 % ophthalmic solution Place 1 drop into the right eye 2 (two) times daily.   No current facility-administered medications for this visit. (Ophthalmic Drugs)   Current Outpatient Medications (Other)  Medication Sig   acetaZOLAMIDE  ER (DIAMOX ) 500 MG capsule Take 500 mg by mouth 2 (two) times daily.   ALPRAZolam  (XANAX ) 0.5  MG tablet Take 1 tab p.o. qhs for 2 weeks, then take 1/2 tab p.o. qhs, then take 1/2 tab qod, then discontinue. (Patient taking differently: Take 0.25 mg by mouth 2 (two) times daily as needed for anxiety.)   amLODipine-benazepril (LOTREL) 5-40 MG capsule TAKE (1) CAPSULE BY MOUTH ONCE DAILY.   Cholecalciferol (VITAMIN D3) 125 MCG (5000 UT) CAPS Take 5,000 Units by mouth daily.   diphenoxylate-atropine  (LOMOTIL) 2.5-0.025 MG tablet TAKE 2 TABLETS BY MOUTH 4 TIMES DAILY AS NEEDED FOR DIARRHEA OR LOOSE STOOLS. MAX 8 TABLETS IN 24 HOURS.   estradiol  (ESTRACE ) 0.1 MG/GM vaginal cream Place 1/2 gram vaginally at hs two to three times per week as needed. (Patient taking differently: Place 0.5 Applicatorfuls vaginally 2 (two) times a week.)   fluorouracil (EFUDEX) 5 % cream Apply 1 Application topically daily as needed (skin cancer issue).   hydrochlorothiazide (HYDRODIURIL) 25 MG tablet TAKE ONE TABLET BY MOUTH ONCE DAILY AS NEEDED. DO NOT EXCEED 3 PER WEEK. APPT REQUIRED FOR FUTURE REFILLS (Patient taking differently: Take 25 mg by mouth 3 (three) times a week.  DO NOT EXCEED 3 PER WEEK. APPT REQUIRED FOR FUTURE REFILLS)   ibuprofen  (ADVIL ) 800 MG tablet Take 1 tablet (800 mg total) by mouth every 8 (eight) hours as needed.   pantoprazole (PROTONIX) 40 MG tablet Take 40 mg by mouth daily as needed (acid reflux).  Probiotic, Lactobacillus, CAPS Take 1 capsule by mouth daily.   Semaglutide,0.25 or 0.5MG /DOS, (OZEMPIC, 0.25 OR 0.5 MG/DOSE,) 2 MG/1.5ML SOPN Inject 0.5 mg into the skin once a week.   TRULANCE  3 MG TABS TAKE 1 TABLET BY MOUTH DAILY (Patient taking differently: Take 3 mg by mouth 2 (two) times a week.)   zolpidem (AMBIEN) 10 MG tablet Take 10 mg by mouth at bedtime.   tretinoin (RETIN-A) 0.05 % cream Apply 1 Application topically 3 (three) times a week.   No current facility-administered medications for this visit. (Other)   REVIEW OF SYSTEMS: ROS   Positive for: Constitutional,  Gastrointestinal, Eyes Negative for: Neurological, Skin, Genitourinary, Musculoskeletal, HENT, Endocrine, Cardiovascular, Respiratory, Psychiatric, Allergic/Imm, Heme/Lymph Last edited by Orval Asberry RAMAN, COA on 10/12/2023  1:36 PM.      ALLERGIES Allergies  Allergen Reactions   Augmentin [Amoxicillin-Pot Clavulanate] Diarrhea   Codeine Nausea Only   PAST MEDICAL HISTORY Past Medical History:  Diagnosis Date   Allergy    Anxiety    Arthritis    fingers   ASCUS with positive high risk human papillomavirus of vagina dx 10/11/15   Cancer (HCC)    basal cell skin   Hepatitis    age 82 , unknown type   History of vulvar dysplasia    VIN 2   Hypertension    IBS (irritable bowel syndrome)    Osteopenia 2018   Rectocele    Urethral pain    mass   Urinary incontinence    VAIN II (vaginal intraepithelial neoplasia grade II) dx 08/2015   right and left labia minora. This is really VIN II and not VAIN II.    Wears contact lenses    Past Surgical History:  Procedure Laterality Date   ABDOMINAL SACROCOLPOPEXY N/A 03/28/2016   Procedure: ABDOMINO SACROCOLPOPEXY;  Surgeon: Bobie FORBES Cathlyn JAYSON Nikki, MD;  Location: [facility];  Service: Gynecology;  Laterality: N/A;   ANAL RECTAL MANOMETRY N/A 08/09/2022   Procedure: ANO RECTAL MANOMETRY;  Surgeon: Abran Norleen SAILOR, MD;  Location: [facility];  Service: Gastroenterology;  Laterality: N/A;   ANTERIOR AND POSTERIOR REPAIR N/A 03/28/2016   Procedure: ANTERIOR (CYSTOCELE) AND POSTERIOR REPAIR (RECTOCELE);  Surgeon: Bobie FORBES Cathlyn JAYSON Nikki, MD;  Location: [facility];  Service: Gynecology;  Laterality: N/A;   AUGMENTATION MAMMAPLASTY  APRIL 2013   BREAST IMPLANT CHANGED   BILATERAL SALPINGECTOMY Bilateral 03/28/2016   Procedure: BILATERAL SALPINGECTOMY;  Surgeon: Bobie FORBES Cathlyn JAYSON Nikki, MD;  Location: [facility];  Service: Gynecology;  Laterality: Bilateral;   BLADDER SUSPENSION N/A 03/28/2016   Procedure: TRANSVAGINAL TAPE (TVT) PROCEDURE exact  midurethral sling;  Surgeon: Bobie FORBES Cathlyn JAYSON Nikki, MD;  Location: [facility];  Service: Gynecology;  Laterality: N/A;   CATARACT EXTRACTION W/PHACO Right 08/10/2023   Procedure: PHACOEMULSIFICATION, CATARACT;  Surgeon: Juli Blunt, MD;  Location: [facility];  Service: Ophthalmology;  Laterality: Right;  CDE: 6.32   COLONOSCOPY     CYSTOSCOPY N/A 03/28/2016   Procedure: CYSTOSCOPY;  Surgeon: Bobie FORBES Cathlyn JAYSON Nikki, MD;  Location: [facility];  Service: Gynecology;  Laterality: N/A;   CYSTOSCOPY WITH BIOPSY Bilateral 11/03/2015   Procedure: CYSTOSCOPY WITH URETHRAL BIOPSY, FULGERATION, BILATERAL RETROGRADE PYELOGRAMS;  Surgeon: Ricardo Likens, MD;  Location: [facility];  Service: Urology;  Laterality: Bilateral;   GAS INSERTION Right 03/22/2023   Procedure: INSERTION OF GAS;  Surgeon: Valdemar Rogue, MD;  Location: [facility] OR;  Service: Ophthalmology;  Laterality: Right;   INSERTION, STENT, DRUG-ELUTING,  LACRIMAL CANALICULUS Right 08/10/2023   Procedure: INSERTION, STENT, DRUG-ELUTING, LACRIMAL CANALICULUS;  Surgeon: Juli Blunt, MD;  Location: [facility];  Service: Ophthalmology;  Laterality: Right;   LYSIS OF ADHESION N/A 03/28/2016   Procedure: LYSIS OF ADHESION;  Surgeon: Bobie FORBES Cathlyn JAYSON Nikki, MD;  Location: [facility];  Service: Gynecology;  Laterality: N/A;   MASS EXCISION N/A 11/03/2015   Procedure: EXCISION  URETHRAL MASS;  Surgeon: Ricardo Likens, MD;  Location[facility];  Service: Urology;  Laterality: N/A;   PARS PLANA VITRECTOMY W/REMOVE OF INTERNAL LIMITING MEMBRANE W/TAMPONADE Right 08/23/2023   Procedure: PARS PLANA VITRECTOMY WITH 25 GAUGE WITH REMOVAL OF INTERNAL LIMITING MEMBRANE OF RETINA WITH INTRAOCULAR TAMPONADE;  Surgeon: Valdemar Rogue, MD;  Location: [facility] OR;  Service: Ophthalmology;  Laterality: Right;   REMOVAL RETAINED LENS Right 08/23/2023   Procedure: REMOVAL, RETAINED LENS MATTER;  Surgeon: Valdemar Rogue, MD;  Location: [facility] OR;  Service: Ophthalmology;   Laterality: Right;   SCLERAL BUCKLE Right 03/22/2023   Procedure: SCLERAL BUCKLE;  Surgeon: Valdemar Rogue, MD;  Location: [facility] OR;  Service: Ophthalmology;  Laterality: Right;   TUBAL LIGATION     VAGINAL HYSTERECTOMY  1999   VITRECTOMY 25 GAUGE WITH SCLERAL BUCKLE Right 03/22/2023   Procedure: VITRECTOMY 25 GAUGE;  Surgeon: Valdemar Rogue, MD;  Location: [facility] OR;  Service: Ophthalmology;  Laterality: Right;   FAMILY HISTORY Family History  Problem Relation Age of Onset   Hypertension Mother    Osteoporosis Mother    Paget's disease of bone Mother    Heart failure Mother    Cancer Father        MELANOMA   Diabetes Father    Hypertension Father    Heart disease Father    Cancer Sister        THYROID    Breast cancer Paternal Grandmother    Colon cancer Neg Hx    Pancreatic cancer Neg Hx    Esophageal cancer Neg Hx    SOCIAL HISTORY Social History   Tobacco Use   Smoking status: Never   Smokeless tobacco: Never  Vaping Use   Vaping status: Never Used  Substance Use Topics   Alcohol use: Not Currently   Drug use: No       OPHTHALMIC EXAM:  Base Eye Exam     Visual Acuity (Snellen - Linear)       Right Left   Dist [place] CF at 3'    Dist cc  20/30 -1   Dist ph [place] 20/150 -2    Dist ph cc  20/20 -2         Tonometry (Tonopen, 1:33 PM)       Right Left   Pressure 15 15         Pupils       Dark Light Shape React APD   Right 5 5 Irregular NR    Left 3 2 Round Brisk None         Visual Fields (Counting fingers)       Left Right    Full Full         Extraocular Movement       Right Left    Full, Ortho Full, Ortho         Neuro/Psych     Oriented x3: Yes   Mood/Affect: Normal         Dilation     Both eyes: 1.0% Mydriacyl , 2.5% Phenylephrine  @ 1:32 PM  Slit Lamp and Fundus Exam     Slit Lamp Exam       Right Left   Lids/Lashes Dermatochalasis - upper lid Dermatochalasis - upper lid, mild MGD   Conjunctiva/Sclera White  and quiet, sutures dissolving White and quiet   Cornea Mild arcus; well healed cataract wound, trace PEE 1+ Punctate epithelial erosions   Anterior Chamber Deep, 0.5+cell and pigment deep and clear   Iris Round and dilated, defect at 0130 Round and dilated   Lens Aphakia 2-3+ Nuclear sclerosis, 2-3+ Cortical cataract   Anterior Vitreous post vitrectomy, trace cell and pigment syneresis, fine pigment, Posterior vitreous detachment, vitreous condensations         Fundus Exam       Right Left   Disc mild Pallor, Sharp rim, Compact Pink and Sharp, Compact   C/D Ratio 0.2 0.1   Macula Flat, Blunted foveal reflex, ERM / PRF gone, residual thickening, punctate heme IT mac -- improved, persistent cystic changes nasal mac Flat, Blunted foveal reflex, RPE mottling and clumping, No heme or edema   Vessels attenuated, Tortuous attenuated, Tortuous   Periphery Attached over buckle, good buckle height, good laser over buckle and around tears; PRE:OP, Bullous temporal detachment from 0800-1030 with bi-lobed HST at 1030 Attached, No heme, mild paving stone degeneration inferiorly, mild reticular degeneration           Refraction     Wearing Rx       Sphere Cylinder Axis Add   Right +1.50 +0.50 167 +2.25   Left +1.75 +0.50 009 +2.25    Type: Progressive           IMAGING AND PROCEDURES  Imaging and Procedures for 10/12/2023  OCT, Retina - OU - Both Eyes       Right Eye Quality was good. Central Foveal Thickness: 393. Progression has worsened. Findings include no SRF, abnormal foveal contour, intraretinal fluid, macular hole, outer retinal atrophy (ERM and PRF gone, interval increase in central cystic changes, patchy central ORA, fine FTMH IT mac).   Left Eye Quality was good. Central Foveal Thickness: 279. Progression has been stable. Findings include normal foveal contour, no IRF, no SRF (Partial PVD).   Notes *Images captured and stored on drive  Diagnosis / Impression:  OD:  ERM and PRF gone, interval increase in central cystic changes,  patchy central ORA, fine FTMH IT mac OS: NFP; no IRF/SRF   Clinical management:  See below  Abbreviations: NFP - Normal foveal profile. CME - cystoid macular edema. PED - pigment epithelial detachment. IRF - intraretinal fluid. SRF - subretinal fluid. EZ - ellipsoid zone. ERM - epiretinal membrane. ORA - outer retinal atrophy. ORT - outer retinal tubulation. SRHM - subretinal hyper-reflective material. IRHM - intraretinal hyper-reflective material      Injection into Tenon's Capsule - OD - Right Eye       Time Out 10/12/2023. 2:45 PM. Confirmed correct patient, procedure, site, and patient consented.   Anesthesia Topical anesthesia was used. Anesthetic medications included Lidocaine  2%, Proparacaine  0.5%.   Procedure Preparation included 5% betadine  to ocular surface, eyelid speculum. A (25g) needle was used.   Injection: 40 mg triamcinolone  acetonide 40 MG/ML   Route: Other, Site: Right Eye   NDC: [phone], Lot: [id number], Expiration date: 10/14/2024, Waste: 0 mL   Notes 1.0 cc of Kenalog -40 (40 mg) injected into subtenon's capsule in the superotemporal quadrant. Betadine  was applied to Injection area pre and post-injection then rinsed with sterile BSS. 1 drop of  ofloxacin was instilled into the eye. There were no complications. Pt tolerated procedure well.           ASSESSMENT/PLAN:   ICD-10-CM   1. Retained lens material following cataract surgery, right eye  H59.021     2. Right retinal detachment  H33.21     3. Cystoid macular edema of right eye  H35.351 OCT, Retina - OU - Both Eyes    Injection into Tenon's Capsule - OD - Right Eye    triamcinolone  acetonide (KENALOG -40) injection 40 mg    4. Epiretinal membrane (ERM) of right eye  H35.371 OCT, Retina - OU - Both Eyes    5. Essential hypertension  I10     6. Hypertensive retinopathy of both eyes  H35.033     7. Combined forms of age-related  cataract of left eye  H25.812     8. Aphakia of right eye  H27.01       1. Retained lens material OD  - Pt underwent phaco on 04.25.25 - complicated by posterior capsule rupture and dislocation of lens into the vitreous  - pt left aphakic  - pre op BCVA OD HM  - now POW7 s/p PPV/removal of retained lens material/tissue blue/MP OD, 05.08.25             - doing well   - retained lens material removed             - retina attached and in good position, ERM gone              - IOP 15  - OCT OD: ERM and PRF gone, interval increase in central cystic changes, patchy central ORA, fine FTMH IT mac              - cont PF QID OD                          erythromycin  ung PRN OD  - cont bromfenac  QID OD for possible CME component             - post op drop and positioning instructions reviewed              - f/u 3 weeks, DFE/OCT  2. H/o Rhegmatogenous retinal detachment, OD - bullous temporal mac off detachment -- pt reports center involvement maybe 1-2 wks prior - detached from 0800 to 1030, fovea off, bi-lobed HST at 1030 - s/p SB + PPV/PFO/EL/FAX/14% C3F8 OD, 12.05.2024             - retina attached and in good position -- good buckle height and laser around breaks             - IOP 15  - monitor  3. CME OD  - interval increase in central cystic changes on PF and bromfenac  QID OD  - recommend STK OD #1 today, 06.27.25 - pt wishes to proceed with injection   3. Epiretinal membrane, right eye  - original exam showed dense ERM with central thickening and pre retinal fibrosis - now POW7 s/p PPV/removal of retained lens material/tissue blue/MP OD, 05.08.25 as above - OCT shows ERM and PRF gone, interval increase in central cystic changes,  patchy central ORA, fine FTMH IT mac - post-op drops as above  4,5. Hypertensive retinopathy OU - discussed importance of tight BP control - monitor  6. Mixed Cataract OS - The symptoms of cataract, surgical options, and treatments and risks  were  discussed with patient. - discussed diagnosis and progression - monitor  7. Aphakia OD  - discussed options once healed from this most recent surgery  Ophthalmic Meds Ordered this visit:  Meds ordered this encounter  Medications   triamcinolone  acetonide (KENALOG -40) injection 40 mg     Return in about 3 weeks (around 11/02/2023) for f/u CME OD, DFE, OCT.  There are no Patient Instructions on file for this visit.  This document serves as a record of services personally performed by Redell JUDITHANN Hans, MD, PhD. It was created on their behalf by Delon Newness COT, an ophthalmic technician. The creation of this record is the provider's dictation and/or activities during the visit.    Electronically signed by: Delon Newness COT 06.27.25 9:36 PM  This document serves as a record of services personally performed by Redell JUDITHANN Hans, MD, PhD. It was created on their behalf by Alan PARAS. Delores, OA an ophthalmic technician. The creation of this record is the provider's dictation and/or activities during the visit.    Electronically signed by: Alan PARAS. Delores, OA 10/14/23 9:36 PM  Redell JUDITHANN Hans, M.D., Ph.D. Diseases & Surgery of the Retina and Vitreous [facility] 10/12/2023   I have reviewed the above documentation for accuracy and completeness, and I agree with the above. Redell JUDITHANN Hans, M.D., Ph.D. 10/14/23 9:38 PM   Abbreviations: M myopia (nearsighted); A astigmatism; H hyperopia (farsighted); P presbyopia; Mrx spectacle prescription;  CTL contact lenses; OD right eye; OS left eye; OU both eyes  XT exotropia; ET esotropia; PEK punctate epithelial keratitis; PEE punctate epithelial erosions; DES dry eye syndrome; MGD meibomian gland dysfunction; ATs artificial tears; PFAT's preservative free artificial tears; NSC nuclear sclerotic cataract; PSC posterior subcapsular cataract; ERM epi-retinal membrane; PVD posterior vitreous detachment; RD retinal  detachment; DM diabetes mellitus; DR diabetic retinopathy; NPDR non-proliferative diabetic retinopathy; PDR proliferative diabetic retinopathy; CSME clinically significant macular edema; DME diabetic macular edema; dbh dot blot hemorrhages; CWS cotton wool spot; POAG primary open angle glaucoma; C/D cup-to-disc ratio; HVF humphrey visual field; GVF goldmann visual field; OCT optical coherence tomography; IOP intraocular pressure; BRVO Branch retinal vein occlusion; CRVO central retinal vein occlusion; CRAO central retinal artery occlusion; BRAO branch retinal artery occlusion; RT retinal tear; SB scleral buckle; PPV pars plana vitrectomy; VH Vitreous hemorrhage; PRP panretinal laser photocoagulation; IVK intravitreal kenalog ; VMT vitreomacular traction; MH Macular hole;  NVD neovascularization of the disc; NVE neovascularization elsewhere; AREDS age related eye disease study; ARMD age related macular degeneration; POAG primary open angle glaucoma; EBMD epithelial/anterior basement membrane dystrophy; ACIOL anterior chamber intraocular lens; IOL intraocular lens; PCIOL posterior chamber intraocular lens; Phaco/IOL phacoemulsification with intraocular lens placement; PRK photorefractive keratectomy; LASIK laser assisted in situ keratomileusis; HTN hypertension; DM diabetes mellitus; COPD chronic obstructive pulmonary disease

## 2023-10-12 ENCOUNTER — Ambulatory Visit (INDEPENDENT_AMBULATORY_CARE_PROVIDER_SITE_OTHER): Admitting: Ophthalmology

## 2023-10-12 ENCOUNTER — Encounter (INDEPENDENT_AMBULATORY_CARE_PROVIDER_SITE_OTHER): Payer: Self-pay | Admitting: Ophthalmology

## 2023-10-12 DIAGNOSIS — H3321 Serous retinal detachment, right eye: Secondary | ICD-10-CM

## 2023-10-12 DIAGNOSIS — S336XXA Sprain of sacroiliac joint, initial encounter: Secondary | ICD-10-CM | POA: Diagnosis not present

## 2023-10-12 DIAGNOSIS — H35351 Cystoid macular degeneration, right eye: Secondary | ICD-10-CM | POA: Diagnosis not present

## 2023-10-12 DIAGNOSIS — M9901 Segmental and somatic dysfunction of cervical region: Secondary | ICD-10-CM | POA: Diagnosis not present

## 2023-10-12 DIAGNOSIS — S233XXA Sprain of ligaments of thoracic spine, initial encounter: Secondary | ICD-10-CM | POA: Diagnosis not present

## 2023-10-12 DIAGNOSIS — H35033 Hypertensive retinopathy, bilateral: Secondary | ICD-10-CM | POA: Diagnosis not present

## 2023-10-12 DIAGNOSIS — H59021 Cataract (lens) fragments in eye following cataract surgery, right eye: Secondary | ICD-10-CM

## 2023-10-12 DIAGNOSIS — H25812 Combined forms of age-related cataract, left eye: Secondary | ICD-10-CM

## 2023-10-12 DIAGNOSIS — I10 Essential (primary) hypertension: Secondary | ICD-10-CM | POA: Diagnosis not present

## 2023-10-12 DIAGNOSIS — M9902 Segmental and somatic dysfunction of thoracic region: Secondary | ICD-10-CM | POA: Diagnosis not present

## 2023-10-12 DIAGNOSIS — S134XXA Sprain of ligaments of cervical spine, initial encounter: Secondary | ICD-10-CM | POA: Diagnosis not present

## 2023-10-12 DIAGNOSIS — H35371 Puckering of macula, right eye: Secondary | ICD-10-CM | POA: Diagnosis not present

## 2023-10-12 DIAGNOSIS — M9903 Segmental and somatic dysfunction of lumbar region: Secondary | ICD-10-CM | POA: Diagnosis not present

## 2023-10-12 DIAGNOSIS — H2701 Aphakia, right eye: Secondary | ICD-10-CM

## 2023-10-12 MED ORDER — TRIAMCINOLONE ACETONIDE 40 MG/ML IJ SUSP FOR KALEIDOSCOPE
40.0000 mg | INTRAMUSCULAR | Status: AC | PRN
  Administered 2023-10-12: 40 mg

## 2023-10-16 DIAGNOSIS — M9901 Segmental and somatic dysfunction of cervical region: Secondary | ICD-10-CM | POA: Diagnosis not present

## 2023-10-16 DIAGNOSIS — S336XXA Sprain of sacroiliac joint, initial encounter: Secondary | ICD-10-CM | POA: Diagnosis not present

## 2023-10-16 DIAGNOSIS — M9903 Segmental and somatic dysfunction of lumbar region: Secondary | ICD-10-CM | POA: Diagnosis not present

## 2023-10-16 DIAGNOSIS — S233XXA Sprain of ligaments of thoracic spine, initial encounter: Secondary | ICD-10-CM | POA: Diagnosis not present

## 2023-10-16 DIAGNOSIS — M9902 Segmental and somatic dysfunction of thoracic region: Secondary | ICD-10-CM | POA: Diagnosis not present

## 2023-10-16 DIAGNOSIS — S134XXA Sprain of ligaments of cervical spine, initial encounter: Secondary | ICD-10-CM | POA: Diagnosis not present

## 2023-10-22 ENCOUNTER — Telehealth: Payer: Self-pay | Admitting: Obstetrics and Gynecology

## 2023-10-22 ENCOUNTER — Encounter: Payer: Self-pay | Admitting: Obstetrics and Gynecology

## 2023-10-22 ENCOUNTER — Other Ambulatory Visit (HOSPITAL_COMMUNITY)
Admission: RE | Admit: 2023-10-22 | Discharge: 2023-10-22 | Disposition: A | Discharge status: Home / Self Care | Source: Ambulatory Visit | Attending: Obstetrics and Gynecology | Admitting: Obstetrics and Gynecology

## 2023-10-22 ENCOUNTER — Other Ambulatory Visit: Payer: Self-pay

## 2023-10-22 ENCOUNTER — Ambulatory Visit (INDEPENDENT_AMBULATORY_CARE_PROVIDER_SITE_OTHER): Admitting: Obstetrics and Gynecology

## 2023-10-22 VITALS — BP 118/74 | HR 62 | Ht 67.5 in | Wt 138.0 lb

## 2023-10-22 DIAGNOSIS — K5900 Constipation, unspecified: Secondary | ICD-10-CM

## 2023-10-22 DIAGNOSIS — N8189 Other female genital prolapse: Secondary | ICD-10-CM

## 2023-10-22 DIAGNOSIS — N644 Mastodynia: Secondary | ICD-10-CM

## 2023-10-22 DIAGNOSIS — Z780 Asymptomatic menopausal state: Secondary | ICD-10-CM

## 2023-10-22 DIAGNOSIS — Z01419 Encounter for gynecological examination (general) (routine) without abnormal findings: Secondary | ICD-10-CM

## 2023-10-22 DIAGNOSIS — Z1272 Encounter for screening for malignant neoplasm of vagina: Secondary | ICD-10-CM

## 2023-10-22 DIAGNOSIS — Z1151 Encounter for screening for human papillomavirus (HPV): Secondary | ICD-10-CM | POA: Insufficient documentation

## 2023-10-22 DIAGNOSIS — Z8619 Personal history of other infectious and parasitic diseases: Secondary | ICD-10-CM | POA: Diagnosis not present

## 2023-10-22 DIAGNOSIS — Z9189 Other specified personal risk factors, not elsewhere classified: Secondary | ICD-10-CM | POA: Diagnosis not present

## 2023-10-22 MED ORDER — ESTRADIOL 0.1 MG/GM VA CREA: TOPICAL_CREAM | VAGINAL | 1 refills | Status: AC

## 2023-10-22 NOTE — Telephone Encounter (Signed)
 Please also schedule dx right mammogram and [facility] for my [facility].   She has right lateral breast pain and breast implants.  I do not feel a mass.

## 2023-10-22 NOTE — Telephone Encounter (Signed)
 Orders placed. Call made to patient to inform her to call [facility] schedule.

## 2023-10-22 NOTE — Telephone Encounter (Signed)
 Please assist with referral back to [facility] pelvic floor therapy for pelvic floor weakness and constipation.   Patient is a returning patient.

## 2023-10-22 NOTE — Patient Instructions (Signed)

## 2023-10-22 NOTE — Progress Notes (Unsigned)
 68 y.o. G66P0003 Widowed Caucasian female here for a breast and pelvic exam.  Has been having some tenderness in her right lower breast x 4 weeks.  No breast trauma.  Has done some heavy lifting during this same period of time.   Patient is followed for prolapse, vaginal atrophy, history of HPV, vulvar dysplasia, and osteopenia.  Using vaginal estrogen cream.   Has constipation and bloating.  She has done pelvic floor therapy with [facility] and would like to return.   Has weakness of the pelvic floor and constipation.  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   PCP: Bertell Satterfield, MD   No LMP recorded. Patient has had a hysterectomy.           Sexually active: No.  The current method of family planning is tubal ligation.    Menopausal hormone therapy:  Estrace   Exercising: Yes.    Walking & weight lifting  Smoker:  no  OB History     Gravida  3   Para  3   Term  0   Preterm  0   AB  0   Living  3      SAB  0   IAB  0   Ectopic  0   Multiple      Live Births  3           HEALTH MAINTENANCE: Last 2 paps: 06/13/22 neg, 05/05/20 ASCUS, HR HPV neg History of abnormal Pap or positive HPV:  yes Mammogram:  07/03/23 Breast Density Cat C, BIRADS Cat 1 neg  Colonoscopy:  04/30/20 Bone Density:  11/17/22  Result  osteopenia of left hip and left forearm.  FRAX 9.7%/1.2%  Immunization History  Administered Date(s) Administered   Influenza, Seasonal, Injecte, Preservative Fre 01/10/2016   Influenza,inj,Quad PF,6+ Mos 01/06/2015   Influenza-Unspecified 02/18/2014, 01/18/2017, 02/01/2018   Tdap 11/26/2019   Zoster Recombinant(Shingrix) 02/08/2018   Zoster, Live 04/15/2014      reports that she has never smoked. She has never used smokeless tobacco. She reports that she does not currently use alcohol. She reports that she does not use drugs.  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 This is really VIN II and not VAIN II.    Wears contact lenses     Past Surgical History:  Procedure Laterality Date   ABDOMINAL SACROCOLPOPEXY N/A 03/28/2016   Procedure: ABDOMINO SACROCOLPOPEXY;  Surgeon: Bobie FORBES Cathlyn JAYSON Nikki, MD;  Location: [facility];  Service: Gynecology;  Laterality: N/A;   ANAL RECTAL MANOMETRY N/A 08/09/2022   Procedure: ANO RECTAL MANOMETRY;  Surgeon: Abran Norleen SAILOR, MD;  Location: [facility];  Service: Gastroenterology;  Laterality: N/A;   ANTERIOR AND POSTERIOR REPAIR N/A 03/28/2016   Procedure: ANTERIOR (CYSTOCELE) AND POSTERIOR REPAIR (RECTOCELE);  Surgeon: Bobie FORBES  Cathlyn JAYSON Cary, MD;  Location: [facility];  Service: Gynecology;  Laterality: N/A;   AUGMENTATION MAMMAPLASTY  APRIL 2013   BREAST IMPLANT CHANGED   BILATERAL SALPINGECTOMY Bilateral 03/28/2016   Procedure: BILATERAL SALPINGECTOMY;  Surgeon: Bobie FORBES Cathlyn JAYSON Cary, MD;  Location: [facility];  Service: Gynecology;  Laterality: Bilateral;   BLADDER SUSPENSION N/A 03/28/2016   Procedure: TRANSVAGINAL TAPE (TVT) PROCEDURE exact midurethral sling;  Surgeon: Bobie FORBES Cathlyn JAYSON Cary, MD;  Location: [facility];  Service: Gynecology;  Laterality: N/A;   CATARACT EXTRACTION W/PHACO Right 08/10/2023   Procedure: PHACOEMULSIFICATION, CATARACT;  Surgeon: Juli Blunt, MD;  Location: [facility];  Service: Ophthalmology;  Laterality: Right;  CDE: 6.32   COLONOSCOPY     CYSTOSCOPY N/A 03/28/2016   Procedure: CYSTOSCOPY;  Surgeon: Bobie FORBES Cathlyn JAYSON Cary, MD;  Location: [facility];  Service:  Gynecology;  Laterality: N/A;   CYSTOSCOPY WITH BIOPSY Bilateral 11/03/2015   Procedure: CYSTOSCOPY WITH URETHRAL BIOPSY, FULGERATION, BILATERAL RETROGRADE PYELOGRAMS;  Surgeon: Ricardo Likens, MD;  Location: [facility];  Service: Urology;  Laterality: Bilateral;   GAS INSERTION Right 03/22/2023   Procedure: INSERTION OF GAS;  Surgeon: Valdemar Rogue, MD;  Location: [facility] OR;  Service: Ophthalmology;  Laterality: Right;   INSERTION, STENT, DRUG-ELUTING, LACRIMAL CANALICULUS Right 08/10/2023   Procedure: INSERTION, STENT, DRUG-ELUTING, LACRIMAL CANALICULUS;  Surgeon: Juli Blunt, MD;  Location: [facility];  Service: Ophthalmology;  Laterality: Right;   LYSIS OF ADHESION N/A 03/28/2016   Procedure: LYSIS OF ADHESION;  Surgeon: Bobie FORBES Cathlyn JAYSON Cary, MD;  Location: [facility];  Service: Gynecology;  Laterality: N/A;   MASS EXCISION N/A 11/03/2015   Procedure: EXCISION  URETHRAL MASS;  Surgeon: Ricardo Likens, MD;  Location: [facility];  Service: Urology;  Laterality: N/A;   PARS PLANA VITRECTOMY W/REMOVE OF INTERNAL LIMITING MEMBRANE W/TAMPONADE Right 08/23/2023   Procedure: PARS PLANA VITRECTOMY WITH 25 GAUGE WITH REMOVAL OF INTERNAL LIMITING MEMBRANE OF RETINA WITH INTRAOCULAR TAMPONADE;  Surgeon: Valdemar Rogue, MD;  Location: [facility];  Service: Ophthalmology;  Laterality: Right;   REMOVAL RETAINED LENS Right 08/23/2023   Procedure: REMOVAL, RETAINED LENS MATTER;  Surgeon: Valdemar Rogue, MD;  Location: [facility] OR;  Service: Ophthalmology;  Laterality: Right;   SCLERAL BUCKLE Right 03/22/2023   Procedure: SCLERAL BUCKLE;  Surgeon: Valdemar Rogue, MD;  Location: [facility] OR;  Service: Ophthalmology;  Laterality: Right;   TUBAL LIGATION     VAGINAL HYSTERECTOMY  1999   VITRECTOMY 25 GAUGE WITH SCLERAL BUCKLE Right 03/22/2023   Procedure: VITRECTOMY 25 GAUGE;  Surgeon: Valdemar Rogue, MD;  Location: [facility] OR;  Service: Ophthalmology;  Laterality: Right;    Current Outpatient Medications   Medication Sig Dispense Refill   ALPRAZolam  (XANAX ) 0.5 MG tablet Take 1 tab p.o. qhs for 2 weeks, then take 1/2 tab p.o. qhs, then take 1/2 tab qod, then discontinue. 30 tablet 0   amLODipine-benazepril (LOTREL) 5-40 MG capsule TAKE (1) CAPSULE BY MOUTH ONCE DAILY. 30 capsule 0   brimonidine  (ALPHAGAN ) 0.2 % ophthalmic solution Place 1 drop into the right eye in the morning and at bedtime.     Bromfenac  Sodium 0.07 % SOLN Place 1 drop into the right eye 4 (four) times daily. 3 mL 6   Cholecalciferol (VITAMIN D3) 125 MCG (5000 UT) CAPS Take 5,000 Units by mouth daily.     diphenoxylate-atropine  (LOMOTIL) 2.5-0.025 MG tablet TAKE 2 TABLETS BY MOUTH 4 TIMES DAILY AS NEEDED FOR DIARRHEA OR LOOSE STOOLS. MAX 8 TABLETS  IN 24 HOURS. 30 tablet 0   erythromycin  ophthalmic ointment Place 1 Application into the right eye 4 (four) times daily. 3.5 g 5   estradiol  (ESTRACE ) 0.1 MG/GM vaginal cream Place 1/2 gram vaginally at hs two to three times per week as needed. 42.5 g 0   fluorouracil (EFUDEX) 5 % cream Apply 1 Application topically daily as needed (skin cancer issue).     hydrochlorothiazide (HYDRODIURIL) 25 MG tablet TAKE ONE TABLET BY MOUTH ONCE DAILY AS NEEDED. DO NOT EXCEED 3 PER WEEK. APPT REQUIRED FOR FUTURE REFILLS 30 tablet 0   ibuprofen  (ADVIL ) 800 MG tablet Take 1 tablet (800 mg total) by mouth every 8 (eight) hours as needed. 30 tablet 0   pantoprazole (PROTONIX) 40 MG tablet Take 40 mg by mouth daily as needed (acid reflux).     prednisoLONE  acetate (PRED FORTE ) 1 % ophthalmic suspension Place 1 drop into the right eye 6 (six) times daily. 15 mL 0   Probiotic, Lactobacillus, CAPS Take 1 capsule by mouth daily.     timolol  (TIMOPTIC ) 0.5 % ophthalmic solution Place 1 drop into the right eye 2 (two) times daily.     tretinoin (RETIN-A) 0.05 % cream Apply 1 Application topically 3 (three) times a week.     TRULANCE  3 MG TABS TAKE 1 TABLET BY MOUTH DAILY 30 tablet 3   zolpidem (AMBIEN) 10 MG  tablet Take 10 mg by mouth at bedtime.     acetaZOLAMIDE  ER (DIAMOX ) 500 MG capsule Take 500 mg by mouth 2 (two) times daily. (Patient not taking: Reported on 10/22/2023)     Semaglutide,0.25 or 0.5MG /DOS, (OZEMPIC, 0.25 OR 0.5 MG/DOSE,) 2 MG/1.5ML SOPN Inject 0.5 mg into the skin once a week. (Patient not taking: Reported on 10/22/2023)     No current facility-administered medications for this visit.    ALLERGIES: Augmentin [amoxicillin-pot clavulanate] and Codeine  Family History  Problem Relation Age of Onset   Hypertension Mother    Osteoporosis Mother    Paget's disease of bone Mother    Heart failure Mother    Cancer Father        MELANOMA   Diabetes Father    Hypertension Father    Heart disease Father    Cancer Sister        THYROID    Breast cancer Paternal Grandmother    Colon cancer Neg Hx    Pancreatic cancer Neg Hx    Esophageal cancer Neg Hx     Review of Systems  All other systems reviewed and are negative.   PHYSICAL EXAM:  BP 118/74 (BP Location: Left Arm, Patient Position: Sitting)   Pulse 62   Ht 5' 7.5 (1.715 m)   Wt 138 lb (62.6 kg)   SpO2 100%   BMI 21.29 kg/m     General appearance: alert, cooperative and appears stated age Head: normocephalic, without obvious abnormality, atraumatic Neck: no adenopathy, supple, symmetrical, trachea midline and thyroid  normal to inspection and palpation Lungs: clear to auscultation bilaterally Breasts: bilateral implants, no masses or tenderness, No nipple retraction or dimpling, No nipple discharge or bleeding, No axillary adenopathy Heart: regular rate and rhythm Abdomen: soft, non-tender; no masses, no organomegaly Extremities: extremities normal, atraumatic, no cyanosis or edema Skin: skin color, texture, turgor normal. No rashes or lesions Lymph nodes: cervical, supraclavicular, and axillary nodes normal. Neurologic: grossly normal  Pelvic: External genitalia:  no lesions              No abnormal inguinal  nodes palpated.              Urethra:  normal appearing urethra with no masses, tenderness or lesions              Bartholins and Skenes: normal                 Vagina: normal appearing vagina with normal color and discharge, no lesions              Cervix: absent              Pap taken: yes Bimanual Exam:  Uterus: absent              Adnexa: no mass, fullness, tenderness              Rectal exam: yes.  Confirms.              Anus:  normal sphincter tone, no lesions  Chaperone was present for exam:  Kari HERO, CMA  ASSESSMENT: Encounter for breast and pelvic exam.  GYN exam for high risk Medicare patient.  Personal history of other risk factors.  Status post TVH.  Status post abdominosacrocolpopexy and anterior and posterior repair, TVT/cysto.  Good support. Hx HPV. Hx VIN II.  No signs of recurrence. Hx ASCUS and negative HR HPV of vagina. Status post excision of urethral mass.  Condyloma. Vaginal atrophy.  Medication monitoring encounter.  Bilateral breast implants. Right breast pain.  Osteopenia.  Pelvic floor weakness and constipation.   PLAN: [facility]  Self breast awareness reviewed. Pap and HRV collected:  yes Guidelines for Calcium, Vitamin D, regular exercise program including cardiovascular and weight bearing exercise. Medication refills:  Vaginal estradiol  cream.   BMD in 2026.  Referral to [facility] pelvic floor therapy.  Follow up:  1 year.    Additional counseling given.  yes. 25 min  total time was spent for this patient encounter, including preparation, face-to-face counseling with the patient, coordination of care, and documentation of the encounter in addition to doing the breast and pelvic exam.

## 2023-10-23 DIAGNOSIS — M9901 Segmental and somatic dysfunction of cervical region: Secondary | ICD-10-CM | POA: Diagnosis not present

## 2023-10-23 DIAGNOSIS — S134XXA Sprain of ligaments of cervical spine, initial encounter: Secondary | ICD-10-CM | POA: Diagnosis not present

## 2023-10-23 DIAGNOSIS — M9902 Segmental and somatic dysfunction of thoracic region: Secondary | ICD-10-CM | POA: Diagnosis not present

## 2023-10-23 DIAGNOSIS — S336XXA Sprain of sacroiliac joint, initial encounter: Secondary | ICD-10-CM | POA: Diagnosis not present

## 2023-10-23 DIAGNOSIS — S233XXA Sprain of ligaments of thoracic spine, initial encounter: Secondary | ICD-10-CM | POA: Diagnosis not present

## 2023-10-23 DIAGNOSIS — M9903 Segmental and somatic dysfunction of lumbar region: Secondary | ICD-10-CM | POA: Diagnosis not present

## 2023-10-23 NOTE — Progress Notes (Signed)
 [facility] - Clinic Note  10/31/2023   CHIEF COMPLAINT Patient presents for Retina Follow Up  HISTORY OF PRESENT ILLNESS: Stephanie Armstrong is a 68 y.o. female who presents to the clinic today for:  HPI     Retina Follow Up   Patient presents with  Other.  In right eye.  This started 9 months ago.  Duration of 3 weeks.  Since onset it is gradually worsening.  I, the attending physician,  performed the HPI with the patient and updated documentation appropriately.        Comments   Pt states no changes in vision but when viewing letters on the [place] the letters in the OD looked tattered like ripped curtains. Pt has been consistent with PF QID OD and bromfenac  QID OD. Pt noticed more floaters in OS when [facility] but refused dilation in the OS due to not having a driver. Informed pt dilation is highly recommended especially with new floaters.      Last edited by Valdemar Rogue, MD on 11/04/2023 12:16 AM.    Pt states she couldn't see anything on the eye chart until she used the pinholes, bu with the pinholes everything looked shredded, she states her right eye never feels great  Referring physician: Bertell Satterfield, MD [address] [place],  [place] [address]  HISTORICAL INFORMATION:  Selected notes from the MEDICAL RECORD NUMBER Referred by Dr. Darroll for concern of macular hole OD LEE:  Ocular Hx- PMH-   CURRENT MEDICATIONS: Current Outpatient Medications (Ophthalmic Drugs)  Medication Sig   brimonidine  (ALPHAGAN ) 0.2 % ophthalmic solution Place 1 drop into the right eye in the morning and at bedtime.   Bromfenac  Sodium 0.07 % SOLN Place 1 drop into the right eye 4 (four) times daily.   erythromycin  ophthalmic ointment Place 1 Application into the right eye 4 (four) times daily.   prednisoLONE  acetate (PRED FORTE ) 1 % ophthalmic suspension Place 1 drop into the right eye 6 (six) times daily.   timolol  (TIMOPTIC ) 0.5 % ophthalmic solution  Place 1 drop into the right eye 2 (two) times daily.   No current facility-administered medications for this visit. (Ophthalmic Drugs)   Current Outpatient Medications (Other)  Medication Sig   acetaZOLAMIDE  ER (DIAMOX ) 500 MG capsule Take 500 mg by mouth 2 (two) times daily. (Patient not taking: Reported on 10/22/2023)   ALPRAZolam  (XANAX ) 0.5 MG tablet Take 1 tab p.o. qhs for 2 weeks, then take 1/2 tab p.o. qhs, then take 1/2 tab qod, then discontinue.   amLODipine-benazepril (LOTREL) 5-40 MG capsule TAKE (1) CAPSULE BY MOUTH ONCE DAILY.   Cholecalciferol (VITAMIN D3) 125 MCG (5000 UT) CAPS Take 5,000 Units by mouth daily.   diphenoxylate-atropine  (LOMOTIL) 2.5-0.025 MG tablet TAKE 2 TABLETS BY MOUTH 4 TIMES DAILY AS NEEDED FOR DIARRHEA OR LOOSE STOOLS. MAX 8 TABLETS IN 24 HOURS.   estradiol  (ESTRACE ) 0.1 MG/GM vaginal cream Place 1/2 gram vaginally at hs two to three times per week as needed.   fluorouracil (EFUDEX) 5 % cream Apply 1 Application topically daily as needed (skin cancer issue).   hydrochlorothiazide (HYDRODIURIL) 25 MG tablet TAKE ONE TABLET BY MOUTH ONCE DAILY AS NEEDED. DO NOT EXCEED 3 PER WEEK. APPT REQUIRED FOR FUTURE REFILLS   ibuprofen  (ADVIL ) 800 MG tablet Take 1 tablet (800 mg total) by mouth every 8 (eight) hours as needed.   pantoprazole (PROTONIX) 40 MG tablet Take 40 mg by mouth daily as needed (  acid reflux).   Probiotic, Lactobacillus, CAPS Take 1 capsule by mouth daily.   Semaglutide,0.25 or 0.5MG /DOS, (OZEMPIC, 0.25 OR 0.5 MG/DOSE,) 2 MG/1.5ML SOPN Inject 0.5 mg into the skin once a week. (Patient not taking: Reported on 10/22/2023)   tretinoin (RETIN-A) 0.05 % cream Apply 1 Application topically 3 (three) times a week.   TRULANCE  3 MG TABS TAKE 1 TABLET BY MOUTH DAILY   zolpidem (AMBIEN) 10 MG tablet Take 10 mg by mouth at bedtime.   No current facility-administered medications for this visit. (Other)   REVIEW OF SYSTEMS: ROS   Positive for: Constitutional,  Gastrointestinal, Eyes Negative for: Neurological, Skin, Genitourinary, Musculoskeletal, HENT, Endocrine, Cardiovascular, Respiratory, Psychiatric, Allergic/Imm, Heme/Lymph Last edited by Elnor Avelina RAMAN, COT on 10/31/2023  1:33 PM.     ALLERGIES Allergies  Allergen Reactions   Augmentin [Amoxicillin-Pot Clavulanate] Diarrhea   Codeine Nausea Only   PAST MEDICAL HISTORY Past Medical History:  Diagnosis Date   Allergy    Anxiety    Arthritis    fingers   ASCUS with positive high risk human papillomavirus of vagina dx 10/11/15   Cancer (HCC)    basal cell skin   Hepatitis    age 36 , unknown type   History of vulvar dysplasia    VIN 2   Hypertension    IBS (irritable bowel syndrome)    Osteopenia 2018   Rectocele    Urethral pain    mass   Urinary incontinence    VAIN II (vaginal intraepithelial neoplasia grade II) dx 08/2015   right and left labia minora. This is really VIN II and not VAIN II.    Wears contact lenses    Past Surgical History:  Procedure Laterality Date   ABDOMINAL SACROCOLPOPEXY N/A 03/28/2016   Procedure: ABDOMINO SACROCOLPOPEXY;  Surgeon: Bobie FORBES Cathlyn JAYSON Nikki, MD;  Location: [facility];  Service: Gynecology;  Laterality: N/A;   ANAL RECTAL MANOMETRY N/A 08/09/2022   Procedure: ANO RECTAL MANOMETRY;  Surgeon: Abran Norleen SAILOR, MD;  Location: [facility];  Service: Gastroenterology;  Laterality: N/A;   ANTERIOR AND POSTERIOR REPAIR N/A 03/28/2016   Procedure: ANTERIOR (CYSTOCELE) AND POSTERIOR REPAIR (RECTOCELE);  Surgeon: Bobie FORBES Cathlyn JAYSON Nikki, MD;  Location: [facility];  Service: Gynecology;  Laterality: N/A;   AUGMENTATION MAMMAPLASTY  APRIL 2013   BREAST IMPLANT CHANGED   BILATERAL SALPINGECTOMY Bilateral 03/28/2016   Procedure: BILATERAL SALPINGECTOMY;  Surgeon: Bobie FORBES Cathlyn JAYSON Nikki, MD;  Location: [facility];  Service: Gynecology;  Laterality: Bilateral;   BLADDER SUSPENSION N/A 03/28/2016   Procedure: TRANSVAGINAL TAPE (TVT) PROCEDURE exact  midurethral sling;  Surgeon: Bobie FORBES Cathlyn JAYSON Nikki, MD;  Location: [facility];  Service: Gynecology;  Laterality: N/A;   CATARACT EXTRACTION W/PHACO Right 08/10/2023   Procedure: PHACOEMULSIFICATION, CATARACT;  Surgeon: Juli Blunt, MD;  Location: [facility];  Service: Ophthalmology;  Laterality: Right;  CDE: 6.32   COLONOSCOPY     CYSTOSCOPY N/A 03/28/2016   Procedure: CYSTOSCOPY;  Surgeon: Bobie FORBES Cathlyn JAYSON Nikki, MD;  Location: [facility];  Service: Gynecology;  Laterality: N/A;   CYSTOSCOPY WITH BIOPSY Bilateral 11/03/2015   Procedure: CYSTOSCOPY WITH URETHRAL BIOPSY, FULGERATION, BILATERAL RETROGRADE PYELOGRAMS;  Surgeon: Ricardo Likens, MD;  Location: [facility];  Service: Urology;  Laterality: Bilateral;   GAS INSERTION Right 03/22/2023   Procedure: INSERTION OF GAS;  Surgeon: Valdemar Rogue, MD;  Location[facility];  Service: Ophthalmology;  Laterality: Right;   INSERTION, STENT, DRUG-ELUTING, LACRIMAL CANALICULUS Right 08/10/2023  Procedure: INSERTION, STENT, DRUG-ELUTING, LACRIMAL CANALICULUS;  Surgeon: Juli Blunt, MD;  Location: [facility];  Service: Ophthalmology;  Laterality: Right;   LYSIS OF ADHESION N/A 03/28/2016   Procedure: LYSIS OF ADHESION;  Surgeon: Bobie FORBES Cathlyn JAYSON Nikki, MD;  Location: [facility];  Service: Gynecology;  Laterality: N/A;   MASS EXCISION N/A 11/03/2015   Procedure: EXCISION  URETHRAL MASS;  Surgeon: Ricardo Likens, MD;  Location: [facility];  Service: Urology;  Laterality: N/A;   PARS PLANA VITRECTOMY W/REMOVE OF INTERNAL LIMITING MEMBRANE W/TAMPONADE Right 08/23/2023   Procedure: PARS PLANA VITRECTOMY WITH 25 GAUGE WITH REMOVAL OF INTERNAL LIMITING MEMBRANE OF RETINA WITH INTRAOCULAR TAMPONADE;  Surgeon: Valdemar Rogue, MD;  Location: [facility] OR;  Service: Ophthalmology;  Laterality: Right;   REMOVAL RETAINED LENS Right 08/23/2023   Procedure: REMOVAL, RETAINED LENS MATTER;  Surgeon: Valdemar Rogue, MD;  Location: [facility] OR;  Service: Ophthalmology;   Laterality: Right;   SCLERAL BUCKLE Right 03/22/2023   Procedure: SCLERAL BUCKLE;  Surgeon: Valdemar Rogue, MD;  Location: [facility] OR;  Service: Ophthalmology;  Laterality: Right;   TUBAL LIGATION     VAGINAL HYSTERECTOMY  1999   VITRECTOMY 25 GAUGE WITH SCLERAL BUCKLE Right 03/22/2023   Procedure: VITRECTOMY 25 GAUGE;  Surgeon: Valdemar Rogue, MD;  Location: [facility] OR;  Service: Ophthalmology;  Laterality: Right;   FAMILY HISTORY Family History  Problem Relation Age of Onset   Hypertension Mother    Osteoporosis Mother    Paget's disease of bone Mother    Heart failure Mother    Cancer Father        MELANOMA   Diabetes Father    Hypertension Father    Heart disease Father    Cancer Sister        THYROID    Breast cancer Paternal Grandmother    Colon cancer Neg Hx    Pancreatic cancer Neg Hx    Esophageal cancer Neg Hx    SOCIAL HISTORY Social History   Tobacco Use   Smoking status: Never   Smokeless tobacco: Never  Vaping Use   Vaping status: Never Used  Substance Use Topics   Alcohol use: Not Currently   Drug use: No       OPHTHALMIC EXAM:  Base Eye Exam     Visual Acuity (Snellen - Linear)       Right Left   Dist [place] CF@3 '    Dist cc  20/30   Dist ph [place] 20/200 -2    Dist ph cc  20/20 -2    Correction: Contacts         Tonometry (Tonopen, 1:27 PM)       Right Left   Pressure 13 13         Pupils       Pupils Dark Light Shape React APD   Right PERRL 4 4 Round None None   Left PERRL 3 1 Round Brisk unable to assess         Visual Fields       Left Right    Full Full         Extraocular Movement       Right Left    Full, Ortho Full, Ortho         Neuro/Psych     Oriented x3: Yes   Mood/Affect: Normal         Dilation     Right eye: 1.0% Mydriacyl , 2.5% Phenylephrine  @ 1:29 PM  Slit Lamp and Fundus Exam     Slit Lamp Exam       Right Left   Lids/Lashes Dermatochalasis - upper lid Dermatochalasis - upper lid, mild  MGD   Conjunctiva/Sclera White and quiet, sutures dissolving, STK ST quad White and quiet   Cornea Mild arcus; well healed cataract wound, 2+ fine PEE 1+ Punctate epithelial erosions, arcus   Anterior Chamber Deep, 0.5+cell and pigment deep and clear   Iris Round and dilated, defect at 0130 Round and reactive   Lens Aphakia 2-3+ Nuclear sclerosis, 2-3+ Cortical cataract   Anterior Vitreous post vitrectomy, trace cell and pigment syneresis, fine pigment, Posterior vitreous detachment, vitreous condensations         Fundus Exam       Right Left   Disc mild Pallor, Sharp rim, Compact Pink and Sharp, Compact   C/D Ratio 0.2 0.1   Macula Flat, Blunted foveal reflex, ERM / PRF gone, residual thickening, punctate heme IT mac -- improved, persistent cystic changes nasal mac -- improved Flat, Blunted foveal reflex, RPE mottling and clumping, No heme or edema   Vessels attenuated, Tortuous attenuated, Tortuous   Periphery Attached over buckle, good buckle height, good laser over buckle and around tears; PRE:OP, Bullous temporal detachment from 0800-1030 with bi-lobed HST at 1030 Attached, No heme, mild paving stone degeneration inferiorly, mild reticular degeneration           IMAGING AND PROCEDURES  Imaging and Procedures for 10/31/2023  OCT, Retina - OU - Both Eyes       Right Eye Quality was good. Central Foveal Thickness: 386. Progression has improved. Findings include no SRF, abnormal foveal contour, intraretinal fluid, macular hole, outer retinal atrophy (ERM and PRF gone, interval improvement in central cystic changes, patchy central ORA, fine FTMH IT mac -- slightly improved).   Left Eye Quality was borderline. Central Foveal Thickness: 269. Progression has been stable. Findings include normal foveal contour, no IRF, no SRF (Partial PVD).   Notes *Images captured and stored on drive  Diagnosis / Impression:  OD: ERM and PRF gone, interval improvement in central cystic changes,  patchy central ORA, fine FTMH IT mac -- slightly improved OS: NFP; no IRF/SRF   Clinical management:  See below  Abbreviations: NFP - Normal foveal profile. CME - cystoid macular edema. PED - pigment epithelial detachment. IRF - intraretinal fluid. SRF - subretinal fluid. EZ - ellipsoid zone. ERM - epiretinal membrane. ORA - outer retinal atrophy. ORT - outer retinal tubulation. SRHM - subretinal hyper-reflective material. IRHM - intraretinal hyper-reflective material           ASSESSMENT/PLAN:   ICD-10-CM   1. Retained lens material following cataract surgery, right eye  H59.021     2. Right retinal detachment  H33.21     3. Cystoid macular edema of right eye  H35.351 OCT, Retina - OU - Both Eyes    4. Epiretinal membrane (ERM) of right eye  H35.371 OCT, Retina - OU - Both Eyes    5. Essential hypertension  I10     6. Hypertensive retinopathy of both eyes  H35.033     7. Combined forms of age-related cataract of left eye  H25.812      1. Retained lens material OD  - Pt underwent phaco on 04.25.25 - complicated by posterior capsule rupture and dislocation of lens into the vitreous  - pt left aphakic  - pre op BCVA OD HM  - s/p PPV/removal of retained lens  material/tissue blue/MP OD, 05.08.25             - doing well   - retained lens material removed             - retina attached and in good position, ERM gone              - IOP 15  - OCT OD: ERM and PRF gone, interval improvement in central cystic changes, patchy central ORA, fine FTMH IT mac -- slightly improved             - cont PF QID OD                          erythromycin  ung PRN OD  - add AT's QID OU  - cont bromfenac  QID OD for possible CME component             - post op drop and positioning instructions reviewed              - f/u 3 weeks, DFE/OCT  2. H/o Rhegmatogenous retinal detachment, OD - bullous temporal mac off detachment -- pt reports center involvement maybe 1-2 wks prior - detached from 0800 to  1030, fovea off, bi-lobed HST at 1030 - s/p SB + PPV/PFO/EL/FAX/14% C3F8 OD, 12.05.2024             - retina attached and in good position -- good buckle height and laser around breaks             - IOP 13  - monitor  3. CME OD  - s/p STK OD #1 (06.27.25)  - interval improvement in central cystic changes on PF and bromfenac  QID OD    3. Epiretinal membrane, right eye  - original exam showed dense ERM with central thickening and pre retinal fibrosis - s/p PPV/removal of retained lens material/tissue blue/MP OD, 05.08.25 as above - OCT shows ERM and PRF gone, interval improvement in central cystic changes,  patchy central ORA, fine FTMH IT mac - slightly improved - post-op drops as above  4,5. Hypertensive retinopathy OU - discussed importance of tight BP control - monitor  6. Mixed Cataract OS - The symptoms of cataract, surgical options, and treatments and risks were discussed with patient. - discussed diagnosis and progression - monitor  7. Aphakia OD  - discussed options once healed from this most recent surgery  Ophthalmic Meds Ordered this visit:  No orders of the defined types were placed in this encounter.    Return in about 3 weeks (around 11/21/2023) for f/u retained lens material OD, DFE, OCT.  There are no Patient Instructions on file for this visit.  This document serves as a record of services personally performed by Redell JUDITHANN Hans, MD, PhD. It was created on their behalf by Almetta Pesa, an ophthalmic technician. The creation of this record is the provider's dictation and/or activities during the visit.    Electronically signed by: Almetta Pesa, OA, 11/04/23  12:16 AM  This document serves as a record of services personally performed by Redell JUDITHANN Hans, MD, PhD. It was created on their behalf by Alan PARAS. Delores, OA an ophthalmic technician. The creation of this record is the provider's dictation and/or activities during the visit.    Electronically  signed by: Alan PARAS. Delores, OA 11/04/23 12:16 AM  Redell JUDITHANN Hans, M.D., Ph.D. Diseases & Surgery of the Retina and Vitreous [facility]  I have reviewed the above documentation for accuracy and completeness, and I agree with the above. Redell JUDITHANN Hans, M.D., Ph.D. 11/04/23 12:22 AM    Abbreviations: M myopia (nearsighted); A astigmatism; H hyperopia (farsighted); P presbyopia; Mrx spectacle prescription;  CTL contact lenses; OD right eye; OS left eye; OU both eyes  XT exotropia; ET esotropia; PEK punctate epithelial keratitis; PEE punctate epithelial erosions; DES dry eye syndrome; MGD meibomian gland dysfunction; ATs artificial tears; PFAT's preservative free artificial tears; NSC nuclear sclerotic cataract; PSC posterior subcapsular cataract; ERM epi-retinal membrane; PVD posterior vitreous detachment; RD retinal detachment; DM diabetes mellitus; DR diabetic retinopathy; NPDR non-proliferative diabetic retinopathy; PDR proliferative diabetic retinopathy; CSME clinically significant macular edema; DME diabetic macular edema; dbh dot blot hemorrhages; CWS cotton wool spot; POAG primary open angle glaucoma; C/D cup-to-disc ratio; HVF humphrey visual field; GVF goldmann visual field; OCT optical coherence tomography; IOP intraocular pressure; BRVO Branch retinal vein occlusion; CRVO central retinal vein occlusion; CRAO central retinal artery occlusion; BRAO branch retinal artery occlusion; RT retinal tear; SB scleral buckle; PPV pars plana vitrectomy; VH Vitreous hemorrhage; PRP panretinal laser photocoagulation; IVK intravitreal kenalog ; VMT vitreomacular traction; MH Macular hole;  NVD neovascularization of the disc; NVE neovascularization elsewhere; AREDS age related eye disease study; ARMD age related macular degeneration; POAG primary open angle glaucoma; EBMD epithelial/anterior basement membrane dystrophy; ACIOL anterior chamber intraocular lens; IOL intraocular lens; PCIOL  posterior chamber intraocular lens; Phaco/IOL phacoemulsification with intraocular lens placement; PRK photorefractive keratectomy; LASIK laser assisted in situ keratomileusis; HTN hypertension; DM diabetes mellitus; COPD chronic obstructive pulmonary disease

## 2023-10-26 LAB — CYTOLOGY - PAP
Comment: NEGATIVE
Diagnosis: NEGATIVE
High risk HPV: NEGATIVE

## 2023-10-29 ENCOUNTER — Ambulatory Visit: Payer: Self-pay | Admitting: Obstetrics and Gynecology

## 2023-10-31 ENCOUNTER — Ambulatory Visit (INDEPENDENT_AMBULATORY_CARE_PROVIDER_SITE_OTHER): Admitting: Ophthalmology

## 2023-10-31 ENCOUNTER — Encounter (INDEPENDENT_AMBULATORY_CARE_PROVIDER_SITE_OTHER): Payer: Self-pay | Admitting: Ophthalmology

## 2023-10-31 DIAGNOSIS — H35351 Cystoid macular degeneration, right eye: Secondary | ICD-10-CM | POA: Diagnosis not present

## 2023-10-31 DIAGNOSIS — H35033 Hypertensive retinopathy, bilateral: Secondary | ICD-10-CM

## 2023-10-31 DIAGNOSIS — I10 Essential (primary) hypertension: Secondary | ICD-10-CM

## 2023-10-31 DIAGNOSIS — H25812 Combined forms of age-related cataract, left eye: Secondary | ICD-10-CM

## 2023-10-31 DIAGNOSIS — H35371 Puckering of macula, right eye: Secondary | ICD-10-CM

## 2023-10-31 DIAGNOSIS — H59021 Cataract (lens) fragments in eye following cataract surgery, right eye: Secondary | ICD-10-CM | POA: Diagnosis not present

## 2023-10-31 DIAGNOSIS — H3321 Serous retinal detachment, right eye: Secondary | ICD-10-CM

## 2023-11-01 DIAGNOSIS — M9903 Segmental and somatic dysfunction of lumbar region: Secondary | ICD-10-CM | POA: Diagnosis not present

## 2023-11-01 DIAGNOSIS — M9902 Segmental and somatic dysfunction of thoracic region: Secondary | ICD-10-CM | POA: Diagnosis not present

## 2023-11-01 DIAGNOSIS — S134XXA Sprain of ligaments of cervical spine, initial encounter: Secondary | ICD-10-CM | POA: Diagnosis not present

## 2023-11-01 DIAGNOSIS — S336XXA Sprain of sacroiliac joint, initial encounter: Secondary | ICD-10-CM | POA: Diagnosis not present

## 2023-11-01 DIAGNOSIS — M9901 Segmental and somatic dysfunction of cervical region: Secondary | ICD-10-CM | POA: Diagnosis not present

## 2023-11-01 DIAGNOSIS — S233XXA Sprain of ligaments of thoracic spine, initial encounter: Secondary | ICD-10-CM | POA: Diagnosis not present

## 2023-11-04 ENCOUNTER — Encounter (INDEPENDENT_AMBULATORY_CARE_PROVIDER_SITE_OTHER): Payer: Self-pay | Admitting: Ophthalmology

## 2023-11-12 NOTE — Progress Notes (Signed)
 [facility] - Clinic Note  11/21/2023   CHIEF COMPLAINT Patient presents for Retina Follow Up  HISTORY OF PRESENT ILLNESS: Stephanie Armstrong is a 68 y.o. female who presents to the clinic today for:  HPI     Retina Follow Up   Patient presents with  Other.  In right eye.  This started 9 months ago.  Duration of 3 weeks.  Since onset it is gradually worsening.  I, the attending physician,  performed the HPI with the patient and updated documentation appropriately.        Comments   Pt states no changes in vision but when viewing letters on the [place] the letters in the OD seem crooked. Pt has been consistent with PF QID OD and bromfenac  QID OD. Pt has been noticing more floaters in OS. Pt denies FOL/pain.       Last edited by Valdemar Rogue, MD on 11/25/2023  3:30 AM.     Pt states   Referring physician: Bertell Satterfield, MD [address] [place],  [place] [address]  HISTORICAL INFORMATION:  Selected notes from the MEDICAL RECORD NUMBER Referred by Dr. Darroll for concern of macular hole OD LEE:  Ocular Hx- PMH-   CURRENT MEDICATIONS: Current Outpatient Medications (Ophthalmic Drugs)  Medication Sig   brimonidine  (ALPHAGAN ) 0.2 % ophthalmic solution Place 1 drop into the right eye in the morning and at bedtime.   Bromfenac  Sodium 0.07 % SOLN Place 1 drop into the right eye 4 (four) times daily.   erythromycin  ophthalmic ointment Place 1 Application into the right eye 4 (four) times daily.   prednisoLONE  acetate (PRED FORTE ) 1 % ophthalmic suspension Place 1 drop into the right eye 6 (six) times daily.   timolol  (TIMOPTIC ) 0.5 % ophthalmic solution Place 1 drop into the right eye 2 (two) times daily.   No current facility-administered medications for this visit. (Ophthalmic Drugs)   Current Outpatient Medications (Other)  Medication Sig   acetaZOLAMIDE  ER (DIAMOX ) 500 MG capsule Take 500 mg by mouth 2 (two) times daily. (Patient not taking:  Reported on 10/22/2023)   ALPRAZolam  (XANAX ) 0.5 MG tablet Take 1 tab p.o. qhs for 2 weeks, then take 1/2 tab p.o. qhs, then take 1/2 tab qod, then discontinue.   amLODipine-benazepril (LOTREL) 5-40 MG capsule TAKE (1) CAPSULE BY MOUTH ONCE DAILY.   Cholecalciferol (VITAMIN D3) 125 MCG (5000 UT) CAPS Take 5,000 Units by mouth daily.   diphenoxylate-atropine  (LOMOTIL) 2.5-0.025 MG tablet TAKE 2 TABLETS BY MOUTH 4 TIMES DAILY AS NEEDED FOR DIARRHEA OR LOOSE STOOLS. MAX 8 TABLETS IN 24 HOURS.   estradiol  (ESTRACE ) 0.1 MG/GM vaginal cream Place 1/2 gram vaginally at hs two to three times per week as needed.   fluorouracil (EFUDEX) 5 % cream Apply 1 Application topically daily as needed (skin cancer issue).   hydrochlorothiazide (HYDRODIURIL) 25 MG tablet TAKE ONE TABLET BY MOUTH ONCE DAILY AS NEEDED. DO NOT EXCEED 3 PER WEEK. APPT REQUIRED FOR FUTURE REFILLS   ibuprofen  (ADVIL ) 800 MG tablet Take 1 tablet (800 mg total) by mouth every 8 (eight) hours as needed.   pantoprazole (PROTONIX) 40 MG tablet Take 40 mg by mouth daily as needed (acid reflux).   Probiotic, Lactobacillus, CAPS Take 1 capsule by mouth daily.   Semaglutide,0.25 or 0.5MG /DOS, (OZEMPIC, 0.25 OR 0.5 MG/DOSE,) 2 MG/1.5ML SOPN Inject 0.5 mg into the skin once a week. (Patient not taking: Reported on 10/22/2023)   tretinoin (RETIN-A) 0.05 % cream  Apply 1 Application topically 3 (three) times a week.   TRULANCE  3 MG TABS TAKE 1 TABLET BY MOUTH DAILY   zolpidem (AMBIEN) 10 MG tablet Take 10 mg by mouth at bedtime.   No current facility-administered medications for this visit. (Other)   REVIEW OF SYSTEMS: ROS   Positive for: Constitutional, Gastrointestinal, Eyes Negative for: Neurological, Skin, Genitourinary, Musculoskeletal, HENT, Endocrine, Cardiovascular, Respiratory, Psychiatric, Allergic/Imm, Heme/Lymph Last edited by Elnor Avelina RAMAN, COT on 11/21/2023  9:24 AM.      ALLERGIES Allergies  Allergen Reactions   Augmentin  [Amoxicillin-Pot Clavulanate] Diarrhea   Codeine Nausea Only   PAST MEDICAL HISTORY Past Medical History:  Diagnosis Date   Allergy    Anxiety    Arthritis    fingers   ASCUS with positive high risk human papillomavirus of vagina dx 10/11/15   Cancer (HCC)    basal cell skin   Hepatitis    age 12 , unknown type   History of vulvar dysplasia    VIN 2   Hypertension    IBS (irritable bowel syndrome)    Osteopenia 2018   Rectocele    Urethral pain    mass   Urinary incontinence    VAIN II (vaginal intraepithelial neoplasia grade II) dx 08/2015   right and left labia minora. This is really VIN II and not VAIN II.    Wears contact lenses    Past Surgical History:  Procedure Laterality Date   ABDOMINAL SACROCOLPOPEXY N/A 03/28/2016   Procedure: ABDOMINO SACROCOLPOPEXY;  Surgeon: Bobie FORBES Cathlyn JAYSON Nikki, MD;  Location: [facility];  Service: Gynecology;  Laterality: N/A;   ANAL RECTAL MANOMETRY N/A 08/09/2022   Procedure: ANO RECTAL MANOMETRY;  Surgeon: Abran Norleen SAILOR, MD;  Location: [facility];  Service: Gastroenterology;  Laterality: N/A;   ANTERIOR AND POSTERIOR REPAIR N/A 03/28/2016   Procedure: ANTERIOR (CYSTOCELE) AND POSTERIOR REPAIR (RECTOCELE);  Surgeon: Bobie FORBES Cathlyn JAYSON Nikki, MD;  Location: [facility];  Service: Gynecology;  Laterality: N/A;   AUGMENTATION MAMMAPLASTY  APRIL 2013   BREAST IMPLANT CHANGED   BILATERAL SALPINGECTOMY Bilateral 03/28/2016   Procedure: BILATERAL SALPINGECTOMY;  Surgeon: Bobie FORBES Cathlyn JAYSON Nikki, MD;  Location: [facility];  Service: Gynecology;  Laterality: Bilateral;   BLADDER SUSPENSION N/A 03/28/2016   Procedure: TRANSVAGINAL TAPE (TVT) PROCEDURE exact midurethral sling;  Surgeon: Bobie FORBES Cathlyn JAYSON Nikki, MD;  Location: [facility];  Service: Gynecology;  Laterality: N/A;   CATARACT EXTRACTION W/PHACO Right 08/10/2023   Procedure: PHACOEMULSIFICATION, CATARACT;  Surgeon: Juli Blunt, MD;  Location: [facility];  Service: Ophthalmology;  Laterality: Right;   CDE: 6.32   COLONOSCOPY     CYSTOSCOPY N/A 03/28/2016   Procedure: CYSTOSCOPY;  Surgeon: Bobie FORBES Cathlyn JAYSON Nikki, MD;  Location: [facility];  Service: Gynecology;  Laterality: N/A;   CYSTOSCOPY WITH BIOPSY Bilateral 11/03/2015   Procedure: CYSTOSCOPY WITH URETHRAL BIOPSY, FULGERATION, BILATERAL RETROGRADE PYELOGRAMS;  Surgeon: Ricardo Likens, MD;  Location[facility];  Service: Urology;  Laterality: Bilateral;   GAS INSERTION Right 03/22/2023   Procedure: INSERTION OF GAS;  Surgeon: Valdemar Rogue, MD;  Location: [facility] OR;  Service: Ophthalmology;  Laterality: Right;   INSERTION, STENT, DRUG-ELUTING, LACRIMAL CANALICULUS Right 08/10/2023   Procedure: INSERTION, STENT, DRUG-ELUTING, LACRIMAL CANALICULUS;  Surgeon: Juli Blunt, MD;  Location: [facility];  Service: Ophthalmology;  Laterality: Right;   LYSIS OF ADHESION N/A 03/28/2016   Procedure: LYSIS OF ADHESION;  Surgeon: Bobie FORBES Cathlyn JAYSON Nikki, MD;  Location: [facility]  ORS;  Service: Gynecology;  Laterality: N/A;   MASS EXCISION N/A 11/03/2015   Procedure: EXCISION  URETHRAL MASS;  Surgeon: Ricardo Likens, MD;  Location: [facility];  Service: Urology;  Laterality: N/A;   PARS PLANA VITRECTOMY W/REMOVE OF INTERNAL LIMITING MEMBRANE W/TAMPONADE Right 08/23/2023   Procedure: PARS PLANA VITRECTOMY WITH 25 GAUGE WITH REMOVAL OF INTERNAL LIMITING MEMBRANE OF RETINA WITH INTRAOCULAR TAMPONADE;  Surgeon: Valdemar Rogue, MD;  Location: [facility] OR;  Service: Ophthalmology;  Laterality: Right;   REMOVAL RETAINED LENS Right 08/23/2023   Procedure: REMOVAL, RETAINED LENS MATTER;  Surgeon: Valdemar Rogue, MD;  Location: [facility] OR;  Service: Ophthalmology;  Laterality: Right;   SCLERAL BUCKLE Right 03/22/2023   Procedure: SCLERAL BUCKLE;  Surgeon: Valdemar Rogue, MD;  Location: [facility] OR;  Service: Ophthalmology;  Laterality: Right;   TUBAL LIGATION     VAGINAL HYSTERECTOMY  1999   VITRECTOMY 25 GAUGE WITH SCLERAL BUCKLE Right 03/22/2023   Procedure:  VITRECTOMY 25 GAUGE;  Surgeon: Valdemar Rogue, MD;  Location: [facility] OR;  Service: Ophthalmology;  Laterality: Right;   FAMILY HISTORY Family History  Problem Relation Age of Onset   Hypertension Mother    Osteoporosis Mother    Paget's disease of bone Mother    Heart failure Mother    Cancer Father        MELANOMA   Diabetes Father    Hypertension Father    Heart disease Father    Cancer Sister        THYROID    Breast cancer Paternal Grandmother    Colon cancer Neg Hx    Pancreatic cancer Neg Hx    Esophageal cancer Neg Hx    SOCIAL HISTORY Social History   Tobacco Use   Smoking status: Never   Smokeless tobacco: Never  Vaping Use   Vaping status: Never Used  Substance Use Topics   Alcohol use: Not Currently   Drug use: No       OPHTHALMIC EXAM:  Base Eye Exam     Visual Acuity (Snellen - Linear)       Right Left   Dist [place] >800    Dist cc  20/30 +2   Dist ph [place] 20/200 -2    Dist ph cc  20/25 +1    Correction: Contacts         Tonometry (Tonopen, 9:21 AM)       Right Left   Pressure 16 15         Pupils       Pupils Dark Light Shape React APD   Right PERRL 3 2 Round Brisk None   Left PERRL 3 2 Round Brisk None         Visual Fields       Left Right    Full Full         Extraocular Movement       Right Left    Full, Ortho Full, Ortho         Neuro/Psych     Oriented x3: Yes   Mood/Affect: Normal         Dilation     Both eyes: 1.0% Mydriacyl , 2.5% Phenylephrine  @ 9:22 AM           Slit Lamp and Fundus Exam     Slit Lamp Exam       Right Left   Lids/Lashes Dermatochalasis - upper lid Dermatochalasis - upper lid, mild MGD   Conjunctiva/Sclera White and quiet, sutures dissolving,  STK ST quad White and quiet   Cornea Mild arcus; well healed cataract wound, 2-3+ fine PEE 1+ Punctate epithelial erosions, arcus   Anterior Chamber Deep and clear deep and clear   Iris Round and dilated, defect at 0130 Round and reactive    Lens Aphakia 2-3+ Nuclear sclerosis, 2-3+ Cortical cataract   Anterior Vitreous post vitrectomy, trace cell pigment syneresis, fine pigment, Posterior vitreous detachment, vitreous condensations         Fundus Exam       Right Left   Disc mild Pallor, Sharp rim, Compact Pink and Sharp   C/D Ratio 0.2 0.1   Macula Flat, Blunted foveal reflex, ERM / PRF gone, residual thickening, punctate heme IT mac -- improved, persistent cystic changes nasal mac -- improved Flat, good foveal reflex, RPE mottling and clumping, No heme or edema   Vessels attenuated, Tortuous attenuated, Tortuous   Periphery Attached over buckle, good buckle height, good laser over buckle and around tears; PRE:OP, Bullous temporal detachment from 0800-1030 with bi-lobed HST at 1030 Attached, No heme, mild paving stone degeneration inferiorly, mild reticular degeneration           IMAGING AND PROCEDURES  Imaging and Procedures for 11/21/2023  OCT, Retina - OU - Both Eyes       Right Eye Quality was good. Central Foveal Thickness: 381. Progression has improved. Findings include no SRF, abnormal foveal contour, intraretinal fluid, macular hole, outer retinal atrophy (ERM and PRF gone, interval improvement in central cystic changes, patchy central ORA, fine FTMH IT mac).   Left Eye Quality was borderline. Central Foveal Thickness: 269. Progression has been stable. Findings include normal foveal contour, no IRF, no SRF (Partial PVD).   Notes *Images captured and stored on drive  Diagnosis / Impression:  OD: ERM and PRF gone, interval improvement in central cystic changes, patchy central ORA, fine FTMH IT mac  OS: NFP; no IRF/SRF   Clinical management:  See below  Abbreviations: NFP - Normal foveal profile. CME - cystoid macular edema. PED - pigment epithelial detachment. IRF - intraretinal fluid. SRF - subretinal fluid. EZ - ellipsoid zone. ERM - epiretinal membrane. ORA - outer retinal atrophy. ORT - outer  retinal tubulation. SRHM - subretinal hyper-reflective material. IRHM - intraretinal hyper-reflective material           ASSESSMENT/PLAN:   ICD-10-CM   1. Retained lens material following cataract surgery, right eye  H59.021     2. Right retinal detachment  H33.21     3. Cystoid macular edema of right eye  H35.351 OCT, Retina - OU - Both Eyes    4. Epiretinal membrane (ERM) of right eye  H35.371 OCT, Retina - OU - Both Eyes    5. Essential hypertension  I10     6. Hypertensive retinopathy of both eyes  H35.033     7. Combined forms of age-related cataract of left eye  H25.812      1. Retained lens material OD  - Pt underwent phaco on 04.25.25 - complicated by posterior capsule rupture and dislocation of lens into the vitreous  - pt left aphakic  - pre op BCVA OD HM  - s/p PPV/removal of retained lens material/tissue blue/MP OD, 05.08.25             - doing well   - retained lens material removed             - retina attached and in good position, ERM gone              -  IOP 15  - OCT OD: ERM and PRF gone, interval improvement in central cystic changes, patchy central ORA, fine FTMH IT mac              - cont PF QID OD                          erythromycin  ung PRN OD  - cont AT's QID OU  - cont bromfenac  QID OD for possible CME component             - post op drop and positioning instructions reviewed   - Refer to Dr. Norleen Hamilton for specialty contact lens eval.              - f/u 3 weeks, DFE/OCT  2. H/o Rhegmatogenous retinal detachment, OD - bullous temporal mac off detachment -- pt reports center involvement maybe 1-2 wks prior - detached from 0800 to 1030, fovea off, bi-lobed HST at 1030 - s/p SB + PPV/PFO/EL/FAX/14% C3F8 OD, 12.05.2024             - retina attached and in good position -- good buckle height and laser around breaks             - IOP 13  - monitor  3. CME OD  - s/p STK OD #1 (06.27.25)  - interval improvement in central cystic changes on PF and  bromfenac  QID OD    3. Epiretinal membrane, right eye  - original exam showed dense ERM with central thickening and pre retinal fibrosis - s/p PPV/removal of retained lens material/tissue blue/MP OD, 05.08.25 as above - OCT shows ERM and PRF gone, interval improvement in central cystic changes,  patchy central ORA, fine FTMH IT mac - slightly improved - post-op drops as above  4,5. Hypertensive retinopathy OU - discussed importance of tight BP control - monitor  6. Mixed Cataract OS - The symptoms of cataract, surgical options, and treatments and risks were discussed with patient. - discussed diagnosis and progression - monitor  7. Aphakia OD  - discussed options once healed from this most recent surgery  Ophthalmic Meds Ordered this visit:  No orders of the defined types were placed in this encounter.    Return for 4-6wks retained lens material OD, DFE, OCT.  There are no Patient Instructions on file for this visit.  This document serves as a record of services personally performed by Redell JUDITHANN Hans, MD, PhD. It was created on their behalf by Almetta Pesa, an ophthalmic technician. The creation of this record is the provider's dictation and/or activities during the visit.    Electronically signed by: Almetta Pesa, OA, 11/25/23  3:31 AM  Redell JUDITHANN Hans, M.D., Ph.D. Diseases & Surgery of the Retina and Vitreous [facility]  I have reviewed the above documentation for accuracy and completeness, and I agree with the above. Redell JUDITHANN Hans, M.D., Ph.D. 11/25/23 3:32 AM   Abbreviations: M myopia (nearsighted); A astigmatism; H hyperopia (farsighted); P presbyopia; Mrx spectacle prescription;  CTL contact lenses; OD right eye; OS left eye; OU both eyes  XT exotropia; ET esotropia; PEK punctate epithelial keratitis; PEE punctate epithelial erosions; DES dry eye syndrome; MGD meibomian gland dysfunction; ATs artificial tears; PFAT's preservative free  artificial tears; NSC nuclear sclerotic cataract; PSC posterior subcapsular cataract; ERM epi-retinal membrane; PVD posterior vitreous detachment; RD retinal detachment; DM diabetes mellitus; DR diabetic retinopathy; NPDR non-proliferative diabetic retinopathy; PDR proliferative  diabetic retinopathy; CSME clinically significant macular edema; DME diabetic macular edema; dbh dot blot hemorrhages; CWS cotton wool spot; POAG primary open angle glaucoma; C/D cup-to-disc ratio; HVF humphrey visual field; GVF goldmann visual field; OCT optical coherence tomography; IOP intraocular pressure; BRVO Branch retinal vein occlusion; CRVO central retinal vein occlusion; CRAO central retinal artery occlusion; BRAO branch retinal artery occlusion; RT retinal tear; SB scleral buckle; PPV pars plana vitrectomy; VH Vitreous hemorrhage; PRP panretinal laser photocoagulation; IVK intravitreal kenalog ; VMT vitreomacular traction; MH Macular hole;  NVD neovascularization of the disc; NVE neovascularization elsewhere; AREDS age related eye disease study; ARMD age related macular degeneration; POAG primary open angle glaucoma; EBMD epithelial/anterior basement membrane dystrophy; ACIOL anterior chamber intraocular lens; IOL intraocular lens; PCIOL posterior chamber intraocular lens; Phaco/IOL phacoemulsification with intraocular lens placement; PRK photorefractive keratectomy; LASIK laser assisted in situ keratomileusis; HTN hypertension; DM diabetes mellitus; COPD chronic obstructive pulmonary disease

## 2023-11-13 ENCOUNTER — Ambulatory Visit
Admission: RE | Admit: 2023-11-13 | Discharge: 2023-11-13 | Disposition: A | Discharge status: Home / Self Care | Source: Ambulatory Visit | Attending: Obstetrics and Gynecology | Admitting: Obstetrics and Gynecology

## 2023-11-13 ENCOUNTER — Ambulatory Visit: Payer: Self-pay | Admitting: Obstetrics and Gynecology

## 2023-11-13 DIAGNOSIS — R92341 Mammographic extreme density, right breast: Secondary | ICD-10-CM | POA: Diagnosis not present

## 2023-11-13 DIAGNOSIS — N644 Mastodynia: Secondary | ICD-10-CM

## 2023-11-20 DIAGNOSIS — E559 Vitamin D deficiency, unspecified: Secondary | ICD-10-CM | POA: Diagnosis not present

## 2023-11-20 DIAGNOSIS — F411 Generalized anxiety disorder: Secondary | ICD-10-CM | POA: Diagnosis not present

## 2023-11-20 DIAGNOSIS — F410 Panic disorder [episodic paroxysmal anxiety] without agoraphobia: Secondary | ICD-10-CM | POA: Diagnosis not present

## 2023-11-20 DIAGNOSIS — R233 Spontaneous ecchymoses: Secondary | ICD-10-CM | POA: Diagnosis not present

## 2023-11-20 DIAGNOSIS — Z79899 Other long term (current) drug therapy: Secondary | ICD-10-CM | POA: Diagnosis not present

## 2023-11-20 DIAGNOSIS — E782 Mixed hyperlipidemia: Secondary | ICD-10-CM | POA: Diagnosis not present

## 2023-11-20 DIAGNOSIS — R7301 Impaired fasting glucose: Secondary | ICD-10-CM | POA: Diagnosis not present

## 2023-11-21 ENCOUNTER — Ambulatory Visit (INDEPENDENT_AMBULATORY_CARE_PROVIDER_SITE_OTHER): Admitting: Ophthalmology

## 2023-11-21 ENCOUNTER — Encounter (INDEPENDENT_AMBULATORY_CARE_PROVIDER_SITE_OTHER): Payer: Self-pay | Admitting: Ophthalmology

## 2023-11-21 DIAGNOSIS — H35351 Cystoid macular degeneration, right eye: Secondary | ICD-10-CM | POA: Diagnosis not present

## 2023-11-21 DIAGNOSIS — H3321 Serous retinal detachment, right eye: Secondary | ICD-10-CM

## 2023-11-21 DIAGNOSIS — H59021 Cataract (lens) fragments in eye following cataract surgery, right eye: Secondary | ICD-10-CM

## 2023-11-21 DIAGNOSIS — I10 Essential (primary) hypertension: Secondary | ICD-10-CM | POA: Diagnosis not present

## 2023-11-21 DIAGNOSIS — H35033 Hypertensive retinopathy, bilateral: Secondary | ICD-10-CM

## 2023-11-21 DIAGNOSIS — H25812 Combined forms of age-related cataract, left eye: Secondary | ICD-10-CM

## 2023-11-21 DIAGNOSIS — H35371 Puckering of macula, right eye: Secondary | ICD-10-CM | POA: Diagnosis not present

## 2023-11-25 ENCOUNTER — Encounter (INDEPENDENT_AMBULATORY_CARE_PROVIDER_SITE_OTHER): Payer: Self-pay | Admitting: Ophthalmology

## 2023-11-29 DIAGNOSIS — H2701 Aphakia, right eye: Secondary | ICD-10-CM | POA: Diagnosis not present

## 2023-11-29 DIAGNOSIS — H2512 Age-related nuclear cataract, left eye: Secondary | ICD-10-CM | POA: Diagnosis not present

## 2023-12-19 NOTE — Progress Notes (Signed)
 [facility] - Clinic Note  12/26/2023   CHIEF COMPLAINT Patient presents for Retina Follow Up  HISTORY OF PRESENT ILLNESS: Stephanie Armstrong is a 68 y.o. female who presents to the clinic today for:  HPI     Retina Follow Up   Diagnosis: Retained lens material.  In right eye.  This started 4.5 months ago.  Severity is moderate.  Duration of 4 weeks.  I, the attending physician,  performed the HPI with the patient and updated documentation appropriately.        Comments   Pt states she has been wearing a contact in her right eye going on 3 weeks now and they are going to get her hard lenses. Pt denies any changes in vision but the [place] letters jump around on and she has trouble seeing what they are. Pt has been consistent with PF QID OD and bromfenac  QID OD. Pt denies FOL/pain.       Last edited by Valdemar Rogue, MD on 01/04/2024  2:42 AM.    Pt states the vision is still wavy even with a contact lens on.   Referring physician: Bertell Satterfield, MD [address] [place],  [place] [address]  HISTORICAL INFORMATION:  Selected notes from the MEDICAL RECORD NUMBER Referred by Dr. Darroll for concern of macular hole OD LEE:  Ocular Hx- PMH-   CURRENT MEDICATIONS: Current Outpatient Medications (Ophthalmic Drugs)  Medication Sig   brimonidine  (ALPHAGAN ) 0.2 % ophthalmic solution Place 1 drop into the right eye in the morning and at bedtime.   Bromfenac  Sodium 0.07 % SOLN Place 1 drop into the right eye 4 (four) times daily.   prednisoLONE  acetate (PRED FORTE ) 1 % ophthalmic suspension Place 1 drop into the right eye in the morning, at noon, in the evening, and at bedtime.   timolol  (TIMOPTIC ) 0.5 % ophthalmic solution Place 1 drop into the right eye 2 (two) times daily.   No current facility-administered medications for this visit. (Ophthalmic Drugs)   Current Outpatient Medications (Other)  Medication Sig   acetaZOLAMIDE  ER (DIAMOX ) 500 MG capsule  Take 500 mg by mouth 2 (two) times daily. (Patient not taking: Reported on 10/22/2023)   ALPRAZolam  (XANAX ) 0.5 MG tablet Take 1 tab p.o. qhs for 2 weeks, then take 1/2 tab p.o. qhs, then take 1/2 tab qod, then discontinue.   amLODipine-benazepril (LOTREL) 5-40 MG capsule TAKE (1) CAPSULE BY MOUTH ONCE DAILY.   Cholecalciferol (VITAMIN D3) 125 MCG (5000 UT) CAPS Take 5,000 Units by mouth daily.   diphenoxylate-atropine  (LOMOTIL) 2.5-0.025 MG tablet TAKE 2 TABLETS BY MOUTH 4 TIMES DAILY AS NEEDED FOR DIARRHEA OR LOOSE STOOLS. MAX 8 TABLETS IN 24 HOURS.   estradiol  (ESTRACE ) 0.1 MG/GM vaginal cream Place 1/2 gram vaginally at hs two to three times per week as needed.   fluorouracil (EFUDEX) 5 % cream Apply 1 Application topically daily as needed (skin cancer issue).   hydrochlorothiazide (HYDRODIURIL) 25 MG tablet TAKE ONE TABLET BY MOUTH ONCE DAILY AS NEEDED. DO NOT EXCEED 3 PER WEEK. APPT REQUIRED FOR FUTURE REFILLS   ibuprofen  (ADVIL ) 800 MG tablet Take 1 tablet (800 mg total) by mouth every 8 (eight) hours as needed.   pantoprazole (PROTONIX) 40 MG tablet Take 40 mg by mouth daily as needed (acid reflux).   Probiotic, Lactobacillus, CAPS Take 1 capsule by mouth daily.   Semaglutide,0.25 or 0.5MG /DOS, (OZEMPIC, 0.25 OR 0.5 MG/DOSE,) 2 MG/1.5ML SOPN Inject 0.5 mg into the skin  once a week. (Patient not taking: Reported on 10/22/2023)   tretinoin (RETIN-A) 0.05 % cream Apply 1 Application topically 3 (three) times a week.   TRULANCE  3 MG TABS TAKE 1 TABLET BY MOUTH DAILY   zolpidem (AMBIEN) 10 MG tablet Take 10 mg by mouth at bedtime.   No current facility-administered medications for this visit. (Other)   REVIEW OF SYSTEMS: ROS   Positive for: Constitutional, Gastrointestinal, Eyes Negative for: Neurological, Skin, Genitourinary, Musculoskeletal, HENT, Endocrine, Cardiovascular, Respiratory, Psychiatric, Allergic/Imm, Heme/Lymph Last edited by Elnor Avelina RAMAN, COT on 12/26/2023  8:47 AM.        ALLERGIES Allergies  Allergen Reactions   Augmentin [Amoxicillin-Pot Clavulanate] Diarrhea   Codeine Nausea Only   PAST MEDICAL HISTORY Past Medical History:  Diagnosis Date   Allergy    Anxiety    Arthritis    fingers   ASCUS with positive high risk human papillomavirus of vagina dx 10/11/15   Cancer (HCC)    basal cell skin   Hepatitis    age 64 , unknown type   History of vulvar dysplasia    VIN 2   Hypertension    IBS (irritable bowel syndrome)    Osteopenia 2018   Rectocele    Urethral pain    mass   Urinary incontinence    VAIN II (vaginal intraepithelial neoplasia grade II) dx 08/2015   right and left labia minora. This is really VIN II and not VAIN II.    Wears contact lenses    Past Surgical History:  Procedure Laterality Date   ABDOMINAL SACROCOLPOPEXY N/A 03/28/2016   Procedure: ABDOMINO SACROCOLPOPEXY;  Surgeon: Bobie FORBES Cathlyn JAYSON Nikki, MD;  Location: [facility];  Service: Gynecology;  Laterality: N/A;   ANAL RECTAL MANOMETRY N/A 08/09/2022   Procedure: ANO RECTAL MANOMETRY;  Surgeon: Abran Norleen SAILOR, MD;  Location: [facility];  Service: Gastroenterology;  Laterality: N/A;   ANTERIOR AND POSTERIOR REPAIR N/A 03/28/2016   Procedure: ANTERIOR (CYSTOCELE) AND POSTERIOR REPAIR (RECTOCELE);  Surgeon: Bobie FORBES Cathlyn JAYSON Nikki, MD;  Location: [facility];  Service: Gynecology;  Laterality: N/A;   AUGMENTATION MAMMAPLASTY  APRIL 2013   BREAST IMPLANT CHANGED   BILATERAL SALPINGECTOMY Bilateral 03/28/2016   Procedure: BILATERAL SALPINGECTOMY;  Surgeon: Bobie FORBES Cathlyn JAYSON Nikki, MD;  Location: [facility];  Service: Gynecology;  Laterality: Bilateral;   BLADDER SUSPENSION N/A 03/28/2016   Procedure: TRANSVAGINAL TAPE (TVT) PROCEDURE exact midurethral sling;  Surgeon: Bobie FORBES Cathlyn JAYSON Nikki, MD;  Location: [facility];  Service: Gynecology;  Laterality: N/A;   CATARACT EXTRACTION W/PHACO Right 08/10/2023   Procedure: PHACOEMULSIFICATION, CATARACT;  Surgeon: Juli Blunt, MD;   Location: [facility];  Service: Ophthalmology;  Laterality: Right;  CDE: 6.32   COLONOSCOPY     CYSTOSCOPY N/A 03/28/2016   Procedure: CYSTOSCOPY;  Surgeon: Bobie FORBES Cathlyn JAYSON Nikki, MD;  Location: [facility];  Service: Gynecology;  Laterality: N/A;   CYSTOSCOPY WITH BIOPSY Bilateral 11/03/2015   Procedure: CYSTOSCOPY WITH URETHRAL BIOPSY, FULGERATION, BILATERAL RETROGRADE PYELOGRAMS;  Surgeon: Ricardo Likens, MD;  Location[facility];  Service: Urology;  Laterality: Bilateral;   GAS INSERTION Right 03/22/2023   Procedure: INSERTION OF GAS;  Surgeon: Valdemar Rogue, MD;  Location[facility];  Service: Ophthalmology;  Laterality: Right;   INSERTION, STENT, DRUG-ELUTING, LACRIMAL CANALICULUS Right 08/10/2023   Procedure: INSERTION, STENT, DRUG-ELUTING, LACRIMAL CANALICULUS;  Surgeon: Juli Blunt, MD;  Location: [facility];  Service: Ophthalmology;  Laterality: Right;   LYSIS OF ADHESION N/A 03/28/2016  Procedure: LYSIS OF ADHESION;  Surgeon: Bobie FORBES Cathlyn JAYSON Nikki, MD;  Location: [facility];  Service: Gynecology;  Laterality: N/A;   MASS EXCISION N/A 11/03/2015   Procedure: EXCISION  URETHRAL MASS;  Surgeon: Ricardo Likens, MD;  Location[facility];  Service: Urology;  Laterality: N/A;   PARS PLANA VITRECTOMY W/REMOVE OF INTERNAL LIMITING MEMBRANE W/TAMPONADE Right 08/23/2023   Procedure: PARS PLANA VITRECTOMY WITH 25 GAUGE WITH REMOVAL OF INTERNAL LIMITING MEMBRANE OF RETINA WITH INTRAOCULAR TAMPONADE;  Surgeon: Valdemar Rogue, MD;  Location: [facility] OR;  Service: Ophthalmology;  Laterality: Right;   REMOVAL RETAINED LENS Right 08/23/2023   Procedure: REMOVAL, RETAINED LENS MATTER;  Surgeon: Valdemar Rogue, MD;  Location: [facility] OR;  Service: Ophthalmology;  Laterality: Right;   SCLERAL BUCKLE Right 03/22/2023   Procedure: SCLERAL BUCKLE;  Surgeon: Valdemar Rogue, MD;  Location: [facility] OR;  Service: Ophthalmology;  Laterality: Right;   TUBAL LIGATION     VAGINAL HYSTERECTOMY  1999   VITRECTOMY 25  GAUGE WITH SCLERAL BUCKLE Right 03/22/2023   Procedure: VITRECTOMY 25 GAUGE;  Surgeon: Valdemar Rogue, MD;  Location: [facility] OR;  Service: Ophthalmology;  Laterality: Right;   FAMILY HISTORY Family History  Problem Relation Age of Onset   Hypertension Mother    Osteoporosis Mother    Paget's disease of bone Mother    Heart failure Mother    Cancer Father        MELANOMA   Diabetes Father    Hypertension Father    Heart disease Father    Cancer Sister        THYROID    Breast cancer Paternal Grandmother    Colon cancer Neg Hx    Pancreatic cancer Neg Hx    Esophageal cancer Neg Hx    SOCIAL HISTORY Social History   Tobacco Use   Smoking status: Never   Smokeless tobacco: Never  Vaping Use   Vaping status: Never Used  Substance Use Topics   Alcohol use: Not Currently   Drug use: No       OPHTHALMIC EXAM:  Base Eye Exam     Visual Acuity (Snellen - Linear)       Right Left   Dist cc 20/150 -2 20/25 -2   Dist ph cc NI 20/20 -2    Correction: Contacts         Tonometry (Tonopen, 8:53 AM)       Right Left   Pressure 15 13         Pupils       Dark Light Shape React APD   Right 4 4 Irregular NR None   Left 3 1 Round Brisk NR         Visual Fields       Left Right    Full Full         Extraocular Movement       Right Left    Full, Ortho Full, Ortho         Neuro/Psych     Oriented x3: Yes   Mood/Affect: Normal         Dilation     Both eyes: 1.0% Mydriacyl , 2.5% Phenylephrine  @ 8:53 AM           Slit Lamp and Fundus Exam     Slit Lamp Exam       Right Left   Lids/Lashes Dermatochalasis - upper lid Dermatochalasis - upper lid, mild MGD   Conjunctiva/Sclera White and quiet, sutures dissolving, STK  ST quad White and quiet   Cornea Mild arcus; well healed cataract wound, 2+ PEE 1+ Punctate epithelial erosions, arcus   Anterior Chamber Deep and clear deep and clear   Iris Round and dilated, defect at 0130 Round and reactive    Lens Aphakia 2-3+ Nuclear sclerosis, 2-3+ Cortical cataract   Anterior Vitreous post vitrectomy, trace cell pigment syneresis, fine pigment, Posterior vitreous detachment, vitreous condensations         Fundus Exam       Right Left   Disc mild Pallor, Sharp rim, Compact Pink and Sharp   C/D Ratio 0.2 0.1   Macula Flat, Blunted foveal reflex, ERM / PRF gone, residual thickening, punctate heme IT mac -- improved, persistent cystic changes nasal mac -- improved Flat, good foveal reflex, RPE mottling and clumping, No heme or edema   Vessels attenuated, Tortuous attenuated, Tortuous   Periphery Attached over buckle, good buckle height, good laser over buckle and around tears; PRE:OP, Bullous temporal detachment from 0800-1030 with bi-lobed HST at 1030 Attached, No heme, mild paving stone degeneration inferiorly, mild reticular degeneration           IMAGING AND PROCEDURES  Imaging and Procedures for 12/26/2023  OCT, Retina - OU - Both Eyes       Right Eye Quality was good. Central Foveal Thickness: 379. Progression has improved. Findings include no SRF, abnormal foveal contour, intraretinal fluid, macular hole, outer retinal atrophy (ERM and PRF gone, interval improvement in central cystic changes, patchy central ORA, fine FTMH IT mac).   Left Eye Quality was borderline. Central Foveal Thickness: 271. Progression has been stable. Findings include normal foveal contour, no IRF, no SRF (Partial PVD).   Notes *Images captured and stored on drive  Diagnosis / Impression:  OD: ERM and PRF gone, interval improvement in central cystic changes, patchy central ORA, fine FTMH IT mac  OS: NFP; no IRF/SRF   Clinical management:  See below  Abbreviations: NFP - Normal foveal profile. CME - cystoid macular edema. PED - pigment epithelial detachment. IRF - intraretinal fluid. SRF - subretinal fluid. EZ - ellipsoid zone. ERM - epiretinal membrane. ORA - outer retinal atrophy. ORT - outer retinal  tubulation. SRHM - subretinal hyper-reflective material. IRHM - intraretinal hyper-reflective material            ASSESSMENT/PLAN:   ICD-10-CM   1. Retained lens material following cataract surgery, right eye  H59.021 OCT, Retina - OU - Both Eyes    2. Right retinal detachment  H33.21     3. Cystoid macular edema of right eye  H35.351     4. Epiretinal membrane (ERM) of right eye  H35.371     5. Essential hypertension  I10     6. Hypertensive retinopathy of both eyes  H35.033     7. Combined forms of age-related cataract of left eye  H25.812       1. Retained lens material OD - Pt underwent phaco on 04.25.25 - complicated by posterior capsule rupture and dislocation of lens into the vitreous  - pt left aphakic  - pre op BCVA OD HM - s/p PPV/removal of retained lens material/tissue blue/MP OD, 05.08.25             - doing well   - retained lens material removed             - retina attached and in good position, ERM gone              -  IOP 15 - OCT OD: ERM and PRF gone, interval improvement in central cystic changes, patchy central ORA, fine FTMH IT mac              - cont PF QID OD - rx sent to pharmacy on file                         erythromycin  ung PRN OD  - cont AT's QID OU - cont bromfenac  QID OD for possible CME component - rx sent to pharmacy on file             - post op drop and positioning instructions reviewed   - Referred to Dr. Norleen Hamilton for specialty contact lens eval.              - f/u 3 weeks, DFE/OCT  2. H/o Rhegmatogenous retinal detachment, OD - bullous temporal mac off detachment -- pt reports center involvement maybe 1-2 wks prior - detached from 0800 to 1030, fovea off, bi-lobed HST at 1030 - s/p SB + PPV/PFO/EL/FAX/14% C3F8 OD, 12.05.2024 - retina attached and in good position -- good buckle height and laser around breaks             - IOP 13  - monitor  3. CME OD  - s/p STK OD #1 (06.27.25) - interval improvement in central cystic  changes on PF and bromfenac  QID OD   3. Epiretinal membrane, right eye  - original exam showed dense ERM with central thickening and pre retinal fibrosis - s/p PPV/removal of retained lens material/tissue blue/MP OD, 05.08.25 as above - OCT shows ERM and PRF gone, interval improvement in central cystic changes,  patchy central ORA, fine FTMH IT mac - slightly improved - post-op drops as above  4,5. Hypertensive retinopathy OU - discussed importance of tight BP control - monitor  6. Mixed Cataract OS - The symptoms of cataract, surgical options, and treatments and risks were discussed with patient. - discussed diagnosis and progression - monitor  7. Aphakia OD  - discussed options once healed from this most recent surgery  Ophthalmic Meds Ordered this visit:  Meds ordered this encounter  Medications   DISCONTD: erythromycin  ophthalmic ointment    Sig: Place 1 Application into the right eye 4 (four) times daily.    Dispense:  3.5 g    Refill:  5   prednisoLONE  acetate (PRED FORTE ) 1 % ophthalmic suspension    Sig: Place 1 drop into the right eye in the morning, at noon, in the evening, and at bedtime.    Dispense:  15 mL    Refill:  0   Bromfenac  Sodium 0.07 % SOLN    Sig: Place 1 drop into the right eye 4 (four) times daily.    Dispense:  3 mL    Refill:  6     Return in about 3 months (around 03/26/2024) for f/u, RD, DFE, OCT.  There are no Patient Instructions on file for this visit.  This document serves as a record of services personally performed by Redell JUDITHANN Hans, MD, PhD. It was created on their behalf by Almetta Pesa, an ophthalmic technician. The creation of this record is the provider's dictation and/or activities during the visit.    Electronically signed by: Almetta Pesa, OA, 01/04/24  2:43 AM  This document serves as a record of services personally performed by Redell JUDITHANN Hans, MD, PhD. It was created on their behalf by  Wanda GEANNIE Keens, COT an  ophthalmic technician. The creation of this record is the provider's dictation and/or activities during the visit.    Electronically signed by:  Wanda GEANNIE Keens, COT  01/04/24 2:43 AM   Redell JUDITHANN Hans, M.D., Ph.D. Diseases & Surgery of the Retina and Vitreous [facility]  I have reviewed the above documentation for accuracy and completeness, and I agree with the above. Redell JUDITHANN Hans, M.D., Ph.D. 01/04/24 2:45 AM   Abbreviations: M myopia (nearsighted); A astigmatism; H hyperopia (farsighted); P presbyopia; Mrx spectacle prescription;  CTL contact lenses; OD right eye; OS left eye; OU both eyes  XT exotropia; ET esotropia; PEK punctate epithelial keratitis; PEE punctate epithelial erosions; DES dry eye syndrome; MGD meibomian gland dysfunction; ATs artificial tears; PFAT's preservative free artificial tears; NSC nuclear sclerotic cataract; PSC posterior subcapsular cataract; ERM epi-retinal membrane; PVD posterior vitreous detachment; RD retinal detachment; DM diabetes mellitus; DR diabetic retinopathy; NPDR non-proliferative diabetic retinopathy; PDR proliferative diabetic retinopathy; CSME clinically significant macular edema; DME diabetic macular edema; dbh dot blot hemorrhages; CWS cotton wool spot; POAG primary open angle glaucoma; C/D cup-to-disc ratio; HVF humphrey visual field; GVF goldmann visual field; OCT optical coherence tomography; IOP intraocular pressure; BRVO Branch retinal vein occlusion; CRVO central retinal vein occlusion; CRAO central retinal artery occlusion; BRAO branch retinal artery occlusion; RT retinal tear; SB scleral buckle; PPV pars plana vitrectomy; VH Vitreous hemorrhage; PRP panretinal laser photocoagulation; IVK intravitreal kenalog ; VMT vitreomacular traction; MH Macular hole;  NVD neovascularization of the disc; NVE neovascularization elsewhere; AREDS age related eye disease study; ARMD age related macular degeneration; POAG primary open  angle glaucoma; EBMD epithelial/anterior basement membrane dystrophy; ACIOL anterior chamber intraocular lens; IOL intraocular lens; PCIOL posterior chamber intraocular lens; Phaco/IOL phacoemulsification with intraocular lens placement; PRK photorefractive keratectomy; LASIK laser assisted in situ keratomileusis; HTN hypertension; DM diabetes mellitus; COPD chronic obstructive pulmonary disease

## 2023-12-24 ENCOUNTER — Ambulatory Visit: Attending: Obstetrics and Gynecology | Admitting: Physical Therapy

## 2023-12-24 ENCOUNTER — Encounter: Payer: Self-pay | Admitting: Physical Therapy

## 2023-12-24 ENCOUNTER — Other Ambulatory Visit: Payer: Self-pay

## 2023-12-24 DIAGNOSIS — N8189 Other female genital prolapse: Secondary | ICD-10-CM | POA: Diagnosis not present

## 2023-12-24 DIAGNOSIS — M6281 Muscle weakness (generalized): Secondary | ICD-10-CM | POA: Diagnosis not present

## 2023-12-24 DIAGNOSIS — R278 Other lack of coordination: Secondary | ICD-10-CM | POA: Insufficient documentation

## 2023-12-24 NOTE — Therapy (Signed)
 OUTPATIENT PHYSICAL THERAPY FEMALE PELVIC EVALUATION   Patient Name: Stephanie Armstrong MRN: [id number] DOB:[birth year], 68 y.o., female Today's Date: 12/24/2023  END OF SESSION:  PT End of Session - 12/24/23 1149     Visit Number 1    Date for PT Re-Evaluation 03/17/24    Authorization Type [employer]    Authorization - Visit Number 1    Authorization - Number of Visits 10    PT Start Time 1100    PT Stop Time 1140    PT Time Calculation (min) 40 min    Activity Tolerance Patient tolerated treatment well    Behavior During Therapy [facility] for tasks assessed/performed          Past Medical History:  Diagnosis Date   Allergy    Anxiety    Arthritis    fingers   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 This is really VIN II and not VAIN II.    Wears contact lenses    Past Surgical History:  Procedure Laterality Date   ABDOMINAL SACROCOLPOPEXY N/A 03/28/2016   Procedure: ABDOMINO SACROCOLPOPEXY;  Surgeon: Bobie FORBES Cathlyn JAYSON Nikki, MD;  Location: [facility];  Service: Gynecology;  Laterality: N/A;   ANAL RECTAL MANOMETRY N/A 08/09/2022   Procedure: ANO RECTAL MANOMETRY;  Surgeon: Abran Norleen SAILOR, MD;  Location: [facility];  Service: Gastroenterology;  Laterality: N/A;   ANTERIOR AND POSTERIOR REPAIR N/A 03/28/2016   Procedure: ANTERIOR (CYSTOCELE) AND POSTERIOR REPAIR (RECTOCELE);  Surgeon: Bobie FORBES Cathlyn JAYSON Nikki, MD;  Location: [facility];  Service: Gynecology;  Laterality: N/A;   AUGMENTATION MAMMAPLASTY  APRIL 2013   BREAST IMPLANT CHANGED   BILATERAL SALPINGECTOMY Bilateral 03/28/2016   Procedure: BILATERAL  SALPINGECTOMY;  Surgeon: Bobie FORBES Cathlyn JAYSON Nikki, MD;  Location: [facility];  Service: Gynecology;  Laterality: Bilateral;   BLADDER SUSPENSION N/A 03/28/2016   Procedure: TRANSVAGINAL TAPE (TVT) PROCEDURE exact midurethral sling;  Surgeon: Bobie FORBES Cathlyn JAYSON Nikki, MD;  Location: [facility];  Service: Gynecology;  Laterality: N/A;   CATARACT EXTRACTION W/PHACO Right 08/10/2023   Procedure: PHACOEMULSIFICATION, CATARACT;  Surgeon: Juli Blunt, MD;  Location: [facility];  Service: Ophthalmology;  Laterality: Right;  CDE: 6.32   COLONOSCOPY     CYSTOSCOPY N/A 03/28/2016   Procedure: CYSTOSCOPY;  Surgeon: Bobie FORBES Cathlyn JAYSON Nikki, MD;  Location: [facility];  Service: Gynecology;  Laterality: N/A;   CYSTOSCOPY WITH BIOPSY Bilateral 11/03/2015   Procedure: CYSTOSCOPY WITH URETHRAL BIOPSY, FULGERATION, BILATERAL RETROGRADE PYELOGRAMS;  Surgeon: Ricardo Likens, MD;  Location: [facility];  Service: Urology;  Laterality: Bilateral;   GAS INSERTION Right 03/22/2023   Procedure: INSERTION OF GAS;  Surgeon: Valdemar Rogue, MD;  Location[facility];  Service: Ophthalmology;  Laterality: Right;   INSERTION, STENT, DRUG-ELUTING, LACRIMAL CANALICULUS Right 08/10/2023   Procedure: INSERTION, STENT, DRUG-ELUTING, LACRIMAL CANALICULUS;  Surgeon: Juli Blunt, MD;  Location: [facility];  Service: Ophthalmology;  Laterality: Right;   LYSIS OF ADHESION N/A 03/28/2016   Procedure: LYSIS  OF ADHESION;  Surgeon: Bobie FORBES Cathlyn JAYSON Nikki, MD;  Location: [facility];  Service: Gynecology;  Laterality: N/A;   MASS EXCISION N/A 11/03/2015   Procedure: EXCISION  URETHRAL MASS;  Surgeon: Ricardo Likens, MD;  Location: [facility];  Service: Urology;  Laterality: N/A;   PARS PLANA VITRECTOMY W/REMOVE OF INTERNAL LIMITING MEMBRANE W/TAMPONADE Right 08/23/2023   Procedure: PARS PLANA VITRECTOMY WITH 25 GAUGE WITH REMOVAL OF INTERNAL LIMITING MEMBRANE OF RETINA WITH INTRAOCULAR TAMPONADE;  Surgeon: Valdemar Rogue, MD;   Location: [facility];  Service: Ophthalmology;  Laterality: Right;   REMOVAL RETAINED LENS Right 08/23/2023   Procedure: REMOVAL, RETAINED LENS MATTER;  Surgeon: Valdemar Rogue, MD;  Location: [facility] OR;  Service: Ophthalmology;  Laterality: Right;   SCLERAL BUCKLE Right 03/22/2023   Procedure: SCLERAL BUCKLE;  Surgeon: Valdemar Rogue, MD;  Location: [facility];  Service: Ophthalmology;  Laterality: Right;   TUBAL LIGATION     VAGINAL HYSTERECTOMY  1999   VITRECTOMY 25 GAUGE WITH SCLERAL BUCKLE Right 03/22/2023   Procedure: VITRECTOMY 25 GAUGE;  Surgeon: Valdemar Rogue, MD;  Location: [facility] OR;  Service: Ophthalmology;  Laterality: Right;   Patient Active Problem List   Diagnosis Date Noted   Constipation due to outlet dysfunction 08/15/2022   Generalized anxiety disorder 09/06/2016   Status post surgery 03/28/2016   Anxiety 08/11/2015   Chondromalacia 08/11/2015   Carpal tunnel syndrome 07/02/2014   Essential hypertension, benign 01/12/2013   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   Easy bruisability 06/26/2012   Menopause 06/26/2012   Insomnia 06/26/2012    PCP: Bertell Satterfield, MD  REFERRING PROVIDER: Cathlyn JAYSON Nikki Bobie FORBES, MD   REFERRING DIAG: [phone] (ICD-10-CM) - Pelvic floor weakness   THERAPY DIAG:  Muscle weakness (generalized) - Plan: PT plan of care cert/re-cert  Other lack of coordination - Plan: PT plan of care cert/re-cert  Rationale for Evaluation and Treatment: Rehabilitation  ONSET DATE: 07/2023  SUBJECTIVE:                                                                                                                                                                                           SUBJECTIVE STATEMENT: Patient is followed for prolapse, vaginal atrophy, history of HPV, vulvar dysplasia, and osteopenia.  Using vaginal estrogen cream. I still do not get a good sensation to have a bowel movement. When she pushes the stool she does not feel fully emptied.  Fluid  intake:   PAIN:  Are you having pain? No   PRECAUTIONS: Other: cancer  RED FLAGS: None   WEIGHT BEARING RESTRICTIONS: No  FALLS:  Has patient fallen  in last 6 months? No  OCCUPATION: retired  ACTIVITY LEVEL : walking  PLOF: Independent  PATIENT GOALS: be able to push the stool out better.   PERTINENT HISTORY:  Vulvar dysplasia; IBS; Osteopenia; Abdominal saccrocolpopexy; anterior posterior repair , bladder suspension; 2017; vaginal hysterectomy 1999 Sexual abuse: No  BOWEL MOVEMENT: Pain with bowel movement: No Type of bowel movement:Type (Bristol Stool Scale) type 2, Frequency every 3-4 days, Strain sometimes, and Splinting yes Fully empty rectum: No Leakage: No Pads: No Fiber supplement/laxative will take Metamucil  URINATION: Pain with urination: No Fully empty bladder: Yes: sometimes takes 2 trips but is infrequent Stream: Weak Urgency: sometimes when been in a car and get to the door Frequency: average Leakage: none  INTERCOURSE:not active   PREGNANCY: Vaginal deliveries 3 Tearing Yes: first child  PROLAPSE: None   OBJECTIVE:  Note: Objective measures were completed at Evaluation unless otherwise noted.  DIAGNOSTIC FINDINGS:  Anal manometry: Weak internal and external sphincter, evidence of dyssynergic defecation and rectal hyposensitivity  PATIENT SURVEYS:  PFIQ-7: 28 CRAIQ-7: 38  COGNITION: Overall cognitive status: Within functional limits for tasks assessed     SENSATION: Light touch: Appears intact   POSTURE: No Significant postural limitations   LUMBARAROM/PROM:Lumbar ROM is full   LOWER EXTREMITY ROM: full bilateral hip ROM   LOWER EXTREMITY MMT: bilateral hip ROM is 5/5  PALPATION:   Abdominal: when contracting her abdomen the lower abdomen with bulge.                 External Perineal Exam: intact                             Internal Pelvic Floor: tight anococcygeal ligament, patient will contract the gluteals  instead of the rectum  Patient confirms identification and approves PT to assess internal pelvic floor and treatment Yes  PELVIC MMT:   MMT eval  Internal Anal Sphincter 2/5  External Anal Sphincter 2/5  Puborectalis 1/5  (Blank rows = not tested)        TONE: Low tone  PROLAPSE: none  TODAY'S TREATMENT:                                                                                                                              DATE: 12/24/23  EVAL Examination completed, findings reviewed, pt educated on POC, HEP, and female pelvic floor anatomy, reasoning with pelvic floor assessment internally with pt consent. Pt motivated to participate in PT and agreeable to attempt recommendations.     PATIENT EDUCATION:  12/24/23 Education details: Access Code: [email or web address] Person educated: Patient Education method: Explanation, Demonstration, Tactile cues, Verbal cues, and Handouts Education comprehension: verbalized understanding, returned demonstration, verbal cues required, tactile cues required, and needs further education  HOME EXERCISE PROGRAM: 12/24/23 Access Code: [email or web address] URL: [email or web address] Date: 12/24/2023 Prepared by: Channing Pereyra  Exercises - Sidelying Pelvic Floor Contraction with Self-Palpation  -  3 x daily - 7 x weekly - 1 sets - 10 reps - 20 sec hold  ASSESSMENT:  CLINICAL IMPRESSION: Patient is a 68 y.o. female who was seen today for physical therapy evaluation and treatment for pelvic floor weakness. Patient has to strain sometimes to have a bowel movement.  She has type 2 bowel movements. She has to press on her rectum at times to have a bowel movement. She would have a bowel movement every 3-4 days. She has trouble having a sensation to have a bowel movement and it affects her traveling and going into the public. Patient will benefit from skilled therapy to improve her sensation of having a bowel movement and more frequently.   OBJECTIVE IMPAIRMENTS:  decreased coordination, decreased strength, and increased fascial restrictions.   ACTIVITY LIMITATIONS: toileting  PARTICIPATION LIMITATIONS: shopping and community activity  PERSONAL FACTORS: 1-2 comorbidities: Vulvar dysplasia; IBS; Osteopenia; Abdominal saccrocolpopexy; anterior posterior repair , bladder suspension; 2017; vaginal hysterectomy 1999 are also affecting patient's functional outcome.   REHAB POTENTIAL: Excellent  CLINICAL DECISION MAKING: Evolving/moderate complexity  EVALUATION COMPLEXITY: Moderate   GOALS: Goals reviewed with patient? Yes  SHORT TERM GOALS: Target date: 01/21/24  Patient is independent with initial HEP for pelvic floor strength and lower abdominal strength.  Baseline: Goal status: INITIAL  2.  Patient is able to feel the sensation of her rectum being full 25% of the time.  Baseline:  Goal status: INITIAL   LONG TERM GOALS: Target date: 03/17/24  Patient independent with advanced HEP for pelvic floor strength and core to generate increased pressure to push stool out.  Baseline:  Goal status: INITIAL  2.  Patient is able to feel her rectum being full 75% of the time due to increased sensitivity of her rectum.  Baseline:  Goal status: INITIAL  3.  Patient is able to push the stool out due to her pelvic floor strength >/= 4/5.  Baseline:  Goal status: INITIAL  4.  CRAIQ-7 score is </= 15 compared to 38 due to feeling like she is able to travel due to feeling she is going to have a bowel movement.  Baseline:  Goal status: INITIAL   PLAN:  PT FREQUENCY: 1x/week  PT DURATION: 12 weeks  PLANNED INTERVENTIONS: 97110-Therapeutic exercises, 97530- Therapeutic activity, 97112- Neuromuscular re-education, 97535- Self Care, [id number]- Manual therapy, and Biofeedback  PLAN FOR NEXT SESSION: work on Mining engineer, RUSI, lower abdominal contraction   Channing Pereyra, PT 12/24/23 1:17 PM

## 2023-12-26 ENCOUNTER — Ambulatory Visit (INDEPENDENT_AMBULATORY_CARE_PROVIDER_SITE_OTHER): Admitting: Ophthalmology

## 2023-12-26 ENCOUNTER — Encounter (INDEPENDENT_AMBULATORY_CARE_PROVIDER_SITE_OTHER): Payer: Self-pay | Admitting: Ophthalmology

## 2023-12-26 DIAGNOSIS — H35351 Cystoid macular degeneration, right eye: Secondary | ICD-10-CM

## 2023-12-26 DIAGNOSIS — H35371 Puckering of macula, right eye: Secondary | ICD-10-CM | POA: Diagnosis not present

## 2023-12-26 DIAGNOSIS — I10 Essential (primary) hypertension: Secondary | ICD-10-CM

## 2023-12-26 DIAGNOSIS — H25812 Combined forms of age-related cataract, left eye: Secondary | ICD-10-CM | POA: Diagnosis not present

## 2023-12-26 DIAGNOSIS — H3321 Serous retinal detachment, right eye: Secondary | ICD-10-CM

## 2023-12-26 DIAGNOSIS — H35033 Hypertensive retinopathy, bilateral: Secondary | ICD-10-CM

## 2023-12-26 DIAGNOSIS — H59021 Cataract (lens) fragments in eye following cataract surgery, right eye: Secondary | ICD-10-CM | POA: Diagnosis not present

## 2023-12-26 MED ORDER — PREDNISOLONE ACETATE 1 % OP SUSP: 1.0000 [drp] | Freq: Four times a day (QID) | OPHTHALMIC | 0 refills | Status: DC

## 2023-12-26 MED ORDER — BROMFENAC SODIUM 0.07 % OP SOLN: 1.0000 [drp] | Freq: Four times a day (QID) | OPHTHALMIC | 6 refills | Status: DC

## 2023-12-26 MED ORDER — ERYTHROMYCIN 5 MG/GM OP OINT: 1.0000 | TOPICAL_OINTMENT | Freq: Four times a day (QID) | OPHTHALMIC | 5 refills | Status: DC

## 2024-01-04 ENCOUNTER — Encounter (INDEPENDENT_AMBULATORY_CARE_PROVIDER_SITE_OTHER): Payer: Self-pay | Admitting: Ophthalmology

## 2024-01-09 ENCOUNTER — Encounter: Payer: Self-pay | Admitting: Physical Therapy

## 2024-01-09 ENCOUNTER — Ambulatory Visit: Payer: Self-pay | Admitting: Physical Therapy

## 2024-01-09 DIAGNOSIS — M6281 Muscle weakness (generalized): Secondary | ICD-10-CM | POA: Diagnosis not present

## 2024-01-09 DIAGNOSIS — R278 Other lack of coordination: Secondary | ICD-10-CM

## 2024-01-09 NOTE — Therapy (Signed)
 OUTPATIENT PHYSICAL THERAPY FEMALE PELVIC TREATMENT   Patient Name: Stephanie Armstrong MRN: [id number] DOB:15-Sep-1955, 68 y.o., female Today's Date: 01/09/2024  END OF SESSION:  PT End of Session - 01/09/24 1231     Visit Number 2    Date for Recertification  03/17/24    Authorization Type [employer]    Authorization - Visit Number 2    Authorization - Number of Visits 10    PT Start Time 1230    PT Stop Time 1310    PT Time Calculation (min) 40 min    Activity Tolerance Patient tolerated treatment well    Behavior During Therapy [facility] for tasks assessed/performed          Past Medical History:  Diagnosis Date   Allergy    Anxiety    Arthritis    fingers   ASCUS with positive high risk human papillomavirus of vagina dx 10/11/15   Cancer (HCC)    basal cell skin   Hepatitis    age 85 , unknown type   History of vulvar dysplasia    VIN 2   Hypertension    IBS (irritable bowel syndrome)    Osteopenia 2018   Rectocele    Urethral pain    mass   Urinary incontinence    VAIN II (vaginal intraepithelial neoplasia grade II) dx 08/2015   right and left labia minora. This is really VIN II and not VAIN II.    Wears contact lenses    Past Surgical History:  Procedure Laterality Date   ABDOMINAL SACROCOLPOPEXY N/A 03/28/2016   Procedure: ABDOMINO SACROCOLPOPEXY;  Surgeon: Bobie FORBES Cathlyn JAYSON Nikki, MD;  Location: [facility];  Service: Gynecology;  Laterality: N/A;   ANAL RECTAL MANOMETRY N/A 08/09/2022   Procedure: ANO RECTAL MANOMETRY;  Surgeon: Abran Norleen SAILOR, MD;  Location: [facility];  Service: Gastroenterology;  Laterality: N/A;   ANTERIOR AND POSTERIOR REPAIR N/A 03/28/2016   Procedure: ANTERIOR (CYSTOCELE) AND POSTERIOR REPAIR (RECTOCELE);  Surgeon: Bobie FORBES Cathlyn JAYSON Nikki, MD;  Location: [facility];  Service: Gynecology;  Laterality: N/A;   AUGMENTATION MAMMAPLASTY  APRIL 2013   BREAST IMPLANT CHANGED   BILATERAL SALPINGECTOMY Bilateral 03/28/2016   Procedure: BILATERAL  SALPINGECTOMY;  Surgeon: Bobie FORBES Cathlyn JAYSON Nikki, MD;  Location: [facility];  Service: Gynecology;  Laterality: Bilateral;   BLADDER SUSPENSION N/A 03/28/2016   Procedure: TRANSVAGINAL TAPE (TVT) PROCEDURE exact midurethral sling;  Surgeon: Bobie FORBES Cathlyn JAYSON Nikki, MD;  Location: [facility];  Service: Gynecology;  Laterality: N/A;   CATARACT EXTRACTION W/PHACO Right 08/10/2023   Procedure: PHACOEMULSIFICATION, CATARACT;  Surgeon: Juli Blunt, MD;  Location: [facility];  Service: Ophthalmology;  Laterality: Right;  CDE: 6.32   COLONOSCOPY     CYSTOSCOPY N/A 03/28/2016   Procedure: CYSTOSCOPY;  Surgeon: Bobie FORBES Cathlyn JAYSON Nikki, MD;  Location: [facility];  Service: Gynecology;  Laterality: N/A;   CYSTOSCOPY WITH BIOPSY Bilateral 11/03/2015   Procedure: CYSTOSCOPY WITH URETHRAL BIOPSY, FULGERATION, BILATERAL RETROGRADE PYELOGRAMS;  Surgeon: Ricardo Likens, MD;  Location: [facility];  Service: Urology;  Laterality: Bilateral;   GAS INSERTION Right 03/22/2023   Procedure: INSERTION OF GAS;  Surgeon: Valdemar Rogue, MD;  Location: [facility] OR;  Service: Ophthalmology;  Laterality: Right;   INSERTION, STENT, DRUG-ELUTING, LACRIMAL CANALICULUS Right 08/10/2023   Procedure: INSERTION, STENT, DRUG-ELUTING, LACRIMAL CANALICULUS;  Surgeon: Juli Blunt, MD;  Location: [facility];  Service: Ophthalmology;  Laterality: Right;   LYSIS OF ADHESION N/A 03/28/2016   Procedure: LYSIS  OF ADHESION;  Surgeon: Bobie FORBES Cathlyn JAYSON Nikki, MD;  Location: [facility];  Service: Gynecology;  Laterality: N/A;   MASS EXCISION N/A 11/03/2015   Procedure: EXCISION  URETHRAL MASS;  Surgeon: Ricardo Likens, MD;  Location[facility]  Service: Urology;  Laterality: N/A;   PARS PLANA VITRECTOMY W/REMOVE OF INTERNAL LIMITING MEMBRANE W/TAMPONADE Right 08/23/2023   Procedure: PARS PLANA VITRECTOMY WITH 25 GAUGE WITH REMOVAL OF INTERNAL LIMITING MEMBRANE OF RETINA WITH INTRAOCULAR TAMPONADE;  Surgeon: Valdemar Rogue, MD;   Location: [facility] OR;  Service: Ophthalmology;  Laterality: Right;   REMOVAL RETAINED LENS Right 08/23/2023   Procedure: REMOVAL, RETAINED LENS MATTER;  Surgeon: Valdemar Rogue, MD;  Location: [facility] OR;  Service: Ophthalmology;  Laterality: Right;   SCLERAL BUCKLE Right 03/22/2023   Procedure: SCLERAL BUCKLE;  Surgeon: Valdemar Rogue, MD;  Location: [facility] OR;  Service: Ophthalmology;  Laterality: Right;   TUBAL LIGATION     VAGINAL HYSTERECTOMY  1999   VITRECTOMY 25 GAUGE WITH SCLERAL BUCKLE Right 03/22/2023   Procedure: VITRECTOMY 25 GAUGE;  Surgeon: Valdemar Rogue, MD;  Location: [facility] OR;  Service: Ophthalmology;  Laterality: Right;   Patient Active Problem List   Diagnosis Date Noted   Constipation due to outlet dysfunction 08/15/2022   Generalized anxiety disorder 09/06/2016   Status post surgery 03/28/2016   Anxiety 08/11/2015   Chondromalacia 08/11/2015   Carpal tunnel syndrome 07/02/2014   Essential hypertension, benign 01/12/2013   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   Easy bruisability 06/26/2012   Menopause 06/26/2012   Insomnia 06/26/2012    PCP: Bertell Satterfield, MD  REFERRING PROVIDER: Cathlyn JAYSON Nikki Bobie FORBES, MD   REFERRING DIAG: [phone] (ICD-10-CM) - Pelvic floor weakness   THERAPY DIAG:  Muscle weakness (generalized)  Other lack of coordination  Rationale for Evaluation and Treatment: Rehabilitation  ONSET DATE: 07/2023  SUBJECTIVE:                                                                                                                                                                                           SUBJECTIVE STATEMENT: I am trying to do the exercises. Patient is getting more sensation that she has to have a bowel movement. I still have trouble pushing all the stool out.   PAIN:  Are you having pain? No   PRECAUTIONS: Other: cancer  RED FLAGS: None   WEIGHT BEARING RESTRICTIONS: No  FALLS:  Has patient fallen in last 6 months?  No  OCCUPATION: retired  ACTIVITY LEVEL : walking  PLOF: Independent  PATIENT GOALS: be able to push the stool out better.   PERTINENT HISTORY:  Vulvar dysplasia; IBS; Osteopenia; Abdominal saccrocolpopexy; anterior posterior repair , bladder suspension; 2017; vaginal hysterectomy 1999 Sexual abuse: No  BOWEL MOVEMENT: Pain with bowel movement: No Type of bowel movement:Type (Bristol Stool Scale) type 2, Frequency every 3-4 days, Strain sometimes, and Splinting yes Fully empty rectum: No Leakage: No Pads: No Fiber supplement/laxative will take Metamucil  URINATION: Pain with urination: No Fully empty bladder: Yes: sometimes takes 2 trips but is infrequent Stream: Weak Urgency: sometimes when been in a car and get to the door Frequency: average Leakage: none  INTERCOURSE:not active   PREGNANCY: Vaginal deliveries 3 Tearing Yes: first child  PROLAPSE: None   OBJECTIVE:  Note: Objective measures were completed at Evaluation unless otherwise noted.  DIAGNOSTIC FINDINGS:  Anal manometry: Weak internal and external sphincter, evidence of dyssynergic defecation and rectal hyposensitivity  PATIENT SURVEYS:  PFIQ-7: 28 CRAIQ-7: 38  COGNITION: Overall cognitive status: Within functional limits for tasks assessed     SENSATION: Light touch: Appears intact   POSTURE: No Significant postural limitations   LUMBARAROM/PROM:Lumbar ROM is full   LOWER EXTREMITY ROM: full bilateral hip ROM   LOWER EXTREMITY MMT: bilateral hip ROM is 5/5  PALPATION:   Abdominal: when contracting her abdomen the lower abdomen with bulge.                 External Perineal Exam: intact                             Internal Pelvic Floor: tight anococcygeal ligament, patient will contract the gluteals instead of the rectum  Patient confirms identification and approves PT to assess internal pelvic floor and treatment Yes  PELVIC MMT:   MMT eval 01/09/24  Internal Anal Sphincter  2/5 2/5  External Anal Sphincter 2/5 2/5  Puborectalis 1/5 2/5  (Blank rows = not tested)        TONE: Low tone  PROLAPSE: none  TODAY'S TREATMENT:  01/09/24 Manual: Soft tissue mobilization: Manual work around the perineal body Internal pelvic floor techniques: No emotional/communication barriers or cognitive limitation. Patient is motivated to learn. Patient understands and agrees with treatment goals and plan. PT explains patient will be examined in standing, sitting, and lying down to see how their muscles and joints work. When they are ready, they will be asked to remove their underwear so PT can examine their perineum. The patient is also given the option of providing their own chaperone as one is not provided in our facility. The patient also has the right and is explained the right to defer or refuse any part of the evaluation or treatment including the internal exam. With the patient's consent, PT will use one gloved finger to gently assess the muscles of the pelvic floor, seeing how well it contracts and relaxes and if there is muscle symmetry. After, the patient will get dressed and PT and patient will discuss exam findings and plan of care. PT and patient discuss plan of care, schedule, attendance policy and HEP activities.  Therapist gloved finger in the rectum working on the anococcygeal ligament, along the puborectalis, along the anterior rectal wall, and superior transverse perineum.  Neuromuscular re-education: Pelvic floor contraction training: Therapist gloved finger in the rectum working on pelvic floor contraction with a lift and the puborectalis coming forward. Using verbal cues to move bladder upward, breath out all the way with contraction and not bulge down Sitting on mat with ball squeeze and lean back  with pelvic floor contraction then sit on a physioball Prone hip extension with pelvic floor contraction                                                                                                                                 DATE: 12/24/23  EVAL Examination completed, findings reviewed, pt educated on POC, HEP, and female pelvic floor anatomy, reasoning with pelvic floor assessment internally with pt consent. Pt motivated to participate in PT and agreeable to attempt recommendations.     PATIENT EDUCATION:  01/09/24 Education details: Access Code: [email or web address] Person educated: Patient Education method: Explanation, Demonstration, Tactile cues, Verbal cues, and Handouts Education comprehension: verbalized understanding, returned demonstration, verbal cues required, tactile cues required, and needs further education  HOME EXERCISE PROGRAM: 01/09/24 Access Code: [email or web address] URL: [email or web address] Date: 01/09/2024 Prepared by: Channing Pereyra  Exercises - Seated Pelvic Floor Contraction with Isometric Hip Adduction  - 3 x daily - 7 x weekly - 1 sets - 10 reps - 15 sec hold - Prone Hip Extension - One Pillow  - 1 x daily - 7 x weekly - 2 sets - 10 reps   ASSESSMENT:  CLINICAL IMPRESSION: Patient is a 67 y.o. female who was seen today for physical therapy treatment for pelvic floor weakness. Pelvic floor strength is 2/5 and sometimes she will have a lift if she fully exhales she will lift the pelvic floor. Patient had some restrictions of the puborectalis and superior transverse perineum. She takes awhile to feel the contraction  Patient will benefit from skilled therapy to improve her sensation of having a bowel movement and more frequently.   OBJECTIVE IMPAIRMENTS: decreased coordination, decreased strength, and increased fascial restrictions.   ACTIVITY LIMITATIONS: toileting  PARTICIPATION LIMITATIONS: shopping and community activity  PERSONAL FACTORS: 1-2 comorbidities: Vulvar dysplasia; IBS; Osteopenia; Abdominal saccrocolpopexy; anterior posterior repair , bladder suspension; 2017; vaginal hysterectomy 1999 are also affecting  patient's functional outcome.   REHAB POTENTIAL: Excellent  CLINICAL DECISION MAKING: Evolving/moderate complexity  EVALUATION COMPLEXITY: Moderate   GOALS: Goals reviewed with patient? Yes  SHORT TERM GOALS: Target date: 01/21/24  Patient is independent with initial HEP for pelvic floor strength and lower abdominal strength.  Baseline: Goal status: INITIAL  2.  Patient is able to feel the sensation of her rectum being full 25% of the time.  Baseline:  Goal status: INITIAL   LONG TERM GOALS: Target date: 03/17/24  Patient independent with advanced HEP for pelvic floor strength and core to generate increased pressure to push stool out.  Baseline:  Goal status: INITIAL  2.  Patient is able to feel her rectum being full 75% of the time due to increased sensitivity of her rectum.  Baseline:  Goal status: INITIAL  3.  Patient is able to push the stool out due to her pelvic floor strength >/= 4/5.  Baseline:  Goal status: INITIAL  4.  CRAIQ-7 score is </= 15 compared to  38 due to feeling like she is able to travel due to feeling she is going to have a bowel movement.  Baseline:  Goal status: INITIAL   PLAN:  PT FREQUENCY: 1x/week  PT DURATION: 12 weeks  PLANNED INTERVENTIONS: 97110-Therapeutic exercises, 97530- Therapeutic activity, 97112- Neuromuscular re-education, 97535- Self Care, [id number]- Manual therapy, and Biofeedback  PLAN FOR NEXT SESSION: work on Mining engineer, RUSI, lower abdominal contraction   Channing Pereyra, PT 01/09/24 1:15 PM

## 2024-01-15 DIAGNOSIS — Z23 Encounter for immunization: Secondary | ICD-10-CM | POA: Diagnosis not present

## 2024-01-15 DIAGNOSIS — L570 Actinic keratosis: Secondary | ICD-10-CM | POA: Diagnosis not present

## 2024-01-15 DIAGNOSIS — L821 Other seborrheic keratosis: Secondary | ICD-10-CM | POA: Diagnosis not present

## 2024-01-15 DIAGNOSIS — L820 Inflamed seborrheic keratosis: Secondary | ICD-10-CM | POA: Diagnosis not present

## 2024-02-06 ENCOUNTER — Ambulatory Visit: Payer: Self-pay | Admitting: Physical Therapy

## 2024-02-15 ENCOUNTER — Ambulatory Visit: Payer: Self-pay | Admitting: Physical Therapy

## 2024-02-18 ENCOUNTER — Encounter: Admitting: Physical Therapy

## 2024-03-03 ENCOUNTER — Ambulatory Visit: Attending: Obstetrics and Gynecology | Admitting: Physical Therapy

## 2024-03-03 ENCOUNTER — Encounter: Payer: Self-pay | Admitting: Physical Therapy

## 2024-03-03 DIAGNOSIS — M6281 Muscle weakness (generalized): Secondary | ICD-10-CM | POA: Insufficient documentation

## 2024-03-03 DIAGNOSIS — R278 Other lack of coordination: Secondary | ICD-10-CM | POA: Insufficient documentation

## 2024-03-03 NOTE — Therapy (Signed)
 OUTPATIENT PHYSICAL THERAPY FEMALE PELVIC TREATMENT   Patient Name: Stephanie Armstrong MRN: [id number] DOB:[birth year], 68 y.o., female Today's Date: 03/03/2024  END OF SESSION:  PT End of Session - 03/03/24 1404     Visit Number 3    Date for Recertification  03/17/24    Authorization Type [employer]    Authorization - Visit Number 3    Authorization - Number of Visits 10    PT Start Time 1400    PT Stop Time 1440    PT Time Calculation (min) 40 min    Activity Tolerance Patient tolerated treatment well    Behavior During Therapy [facility] for tasks assessed/performed          Past Medical History:  Diagnosis Date   Allergy    Anxiety    Arthritis    fingers   ASCUS with positive high risk human papillomavirus of vagina dx 10/11/15   Cancer (HCC)    basal cell skin   Hepatitis    age 56 , unknown type   History of vulvar dysplasia    VIN 2   Hypertension    IBS (irritable bowel syndrome)    Osteopenia 2018   Rectocele    Urethral pain    mass   Urinary incontinence    VAIN II (vaginal intraepithelial neoplasia grade II) dx 08/2015   right and left labia minora. This is really VIN II and not VAIN II.    Wears contact lenses    Past Surgical History:  Procedure Laterality Date   ABDOMINAL SACROCOLPOPEXY N/A 03/28/2016   Procedure: ABDOMINO SACROCOLPOPEXY;  Surgeon: Bobie FORBES Cathlyn JAYSON Nikki, MD;  Location: [facility];  Service: Gynecology;  Laterality: N/A;   ANAL RECTAL MANOMETRY N/A 08/09/2022   Procedure: ANO RECTAL MANOMETRY;  Surgeon: Abran Norleen SAILOR, MD;  Location: [facility];  Service: Gastroenterology;  Laterality: N/A;   ANTERIOR AND POSTERIOR REPAIR N/A 03/28/2016   Procedure: ANTERIOR (CYSTOCELE) AND POSTERIOR REPAIR (RECTOCELE);  Surgeon: Bobie FORBES Cathlyn JAYSON Nikki, MD;  Location: [facility];  Service: Gynecology;  Laterality: N/A;   AUGMENTATION MAMMAPLASTY  APRIL 2013   BREAST IMPLANT CHANGED   BILATERAL SALPINGECTOMY Bilateral 03/28/2016   Procedure: BILATERAL  SALPINGECTOMY;  Surgeon: Bobie FORBES Cathlyn JAYSON Nikki, MD;  Location: [facility];  Service: Gynecology;  Laterality: Bilateral;   BLADDER SUSPENSION N/A 03/28/2016   Procedure: TRANSVAGINAL TAPE (TVT) PROCEDURE exact midurethral sling;  Surgeon: Bobie FORBES Cathlyn JAYSON Nikki, MD;  Location: [facility];  Service: Gynecology;  Laterality: N/A;   CATARACT EXTRACTION W/PHACO Right 08/10/2023   Procedure: PHACOEMULSIFICATION, CATARACT;  Surgeon: Juli Blunt, MD;  Location: [facility];  Service: Ophthalmology;  Laterality: Right;  CDE: 6.32   COLONOSCOPY     CYSTOSCOPY N/A 03/28/2016   Procedure: CYSTOSCOPY;  Surgeon: Bobie FORBES Cathlyn JAYSON Nikki, MD;  Location: [facility];  Service: Gynecology;  Laterality: N/A;   CYSTOSCOPY WITH BIOPSY Bilateral 11/03/2015   Procedure: CYSTOSCOPY WITH URETHRAL BIOPSY, FULGERATION, BILATERAL RETROGRADE PYELOGRAMS;  Surgeon: Ricardo Likens, MD;  Location: [facility];  Service: Urology;  Laterality: Bilateral;   GAS INSERTION Right 03/22/2023   Procedure: INSERTION OF GAS;  Surgeon: Valdemar Rogue, MD;  Location[facility];  Service: Ophthalmology;  Laterality: Right;   INSERTION, STENT, DRUG-ELUTING, LACRIMAL CANALICULUS Right 08/10/2023   Procedure: INSERTION, STENT, DRUG-ELUTING, LACRIMAL CANALICULUS;  Surgeon: Juli Blunt, MD;  Location: [facility];  Service: Ophthalmology;  Laterality: Right;   LYSIS OF ADHESION N/A 03/28/2016   Procedure: LYSIS  OF ADHESION;  Surgeon: Bobie FORBES Cathlyn JAYSON Nikki, MD;  Location: [facility];  Service: Gynecology;  Laterality: N/A;   MASS EXCISION N/A 11/03/2015   Procedure: EXCISION  URETHRAL MASS;  Surgeon: Ricardo Likens, MD;  Location[facility];  Service: Urology;  Laterality: N/A;   PARS PLANA VITRECTOMY W/REMOVE OF INTERNAL LIMITING MEMBRANE W/TAMPONADE Right 08/23/2023   Procedure: PARS PLANA VITRECTOMY WITH 25 GAUGE WITH REMOVAL OF INTERNAL LIMITING MEMBRANE OF RETINA WITH INTRAOCULAR TAMPONADE;  Surgeon: Valdemar Rogue, MD;   Location: [facility] OR;  Service: Ophthalmology;  Laterality: Right;   REMOVAL RETAINED LENS Right 08/23/2023   Procedure: REMOVAL, RETAINED LENS MATTER;  Surgeon: Valdemar Rogue, MD;  Location: [facility];  Service: Ophthalmology;  Laterality: Right;   SCLERAL BUCKLE Right 03/22/2023   Procedure: SCLERAL BUCKLE;  Surgeon: Valdemar Rogue, MD;  Location: [facility] OR;  Service: Ophthalmology;  Laterality: Right;   TUBAL LIGATION     VAGINAL HYSTERECTOMY  1999   VITRECTOMY 25 GAUGE WITH SCLERAL BUCKLE Right 03/22/2023   Procedure: VITRECTOMY 25 GAUGE;  Surgeon: Valdemar Rogue, MD;  Location: [facility] OR;  Service: Ophthalmology;  Laterality: Right;   Patient Active Problem List   Diagnosis Date Noted   Constipation due to outlet dysfunction 08/15/2022   Generalized anxiety disorder 09/06/2016   Status post surgery 03/28/2016   Anxiety 08/11/2015   Chondromalacia 08/11/2015   Carpal tunnel syndrome 07/02/2014   Essential hypertension, benign 01/12/2013   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   Easy bruisability 06/26/2012   Menopause 06/26/2012   Insomnia 06/26/2012    PCP: Bertell Satterfield, MD  REFERRING PROVIDER: Cathlyn JAYSON Nikki Bobie FORBES, MD   REFERRING DIAG: [phone] (ICD-10-CM) - Pelvic floor weakness   THERAPY DIAG:  Muscle weakness (generalized)  Other lack of coordination  Rationale for Evaluation and Treatment: Rehabilitation  ONSET DATE: 07/2023  SUBJECTIVE:                                                                                                                                                                                           SUBJECTIVE STATEMENT: Going to the bathroom is the same. Patient is getting more stool out.   PAIN:  Are you having pain? No   PRECAUTIONS: Other: cancer  RED FLAGS: None   WEIGHT BEARING RESTRICTIONS: No  FALLS:  Has patient fallen in last 6 months? No  OCCUPATION: retired  ACTIVITY LEVEL : walking  PLOF: Independent  PATIENT GOALS:  be able to push the stool out better.   PERTINENT HISTORY:  Vulvar dysplasia; IBS; Osteopenia; Abdominal saccrocolpopexy; anterior posterior repair , bladder suspension; 2017; vaginal hysterectomy  1999 Sexual abuse: No  BOWEL MOVEMENT: Pain with bowel movement: No Type of bowel movement:Type (Bristol Stool Scale) type 2, Frequency every 3-4 days, Strain sometimes, and Splinting yes Fully empty rectum: No Leakage: No Pads: No Fiber supplement/laxative will take Metamucil  URINATION: Pain with urination: No Fully empty bladder: Yes: sometimes takes 2 trips but is infrequent Stream: Weak Urgency: sometimes when been in a car and get to the door Frequency: average Leakage: none  INTERCOURSE:not active   PREGNANCY: Vaginal deliveries 3 Tearing Yes: first child  PROLAPSE: None   OBJECTIVE:  Note: Objective measures were completed at Evaluation unless otherwise noted.  DIAGNOSTIC FINDINGS:  Anal manometry: Weak internal and external sphincter, evidence of dyssynergic defecation and rectal hyposensitivity  PATIENT SURVEYS:  PFIQ-7: 28 CRAIQ-7: 38  COGNITION: Overall cognitive status: Within functional limits for tasks assessed     SENSATION: Light touch: Appears intact   POSTURE: No Significant postural limitations   LUMBARAROM/PROM:Lumbar ROM is full   LOWER EXTREMITY ROM: full bilateral hip ROM   LOWER EXTREMITY MMT: bilateral hip ROM is 5/5  PALPATION:   Abdominal: when contracting her abdomen the lower abdomen with bulge.                 External Perineal Exam: intact                             Internal Pelvic Floor: tight anococcygeal ligament, patient will contract the gluteals instead of the rectum  Patient confirms identification and approves PT to assess internal pelvic floor and treatment Yes  PELVIC MMT:   MMT eval 01/09/24  Internal Anal Sphincter 2/5 2/5  External Anal Sphincter 2/5 2/5  Puborectalis 1/5 2/5  (Blank rows = not  tested)        TONE: Low tone  PROLAPSE: none  TODAY'S TREATMENT:  03/03/24 Manual: Myofascial release: Fascial release with patient on an incline and therapist is releasing the lower abdomen and dong tissue rolling of the  scar Neuromuscular re-education: Form correction: Quadruped with knee on yoga block moving hips side to side then forward and back then cat cow Z stretch with lifting up on knees and then bend the other way 5 times each way Quadruped with therapist putting pressure on the anococcygeal ligament and she is moving back and forth.  Down training: Sitting on ball with diaphragmatic breathing to relax the pelvic floor then breath out into the tube to push the stool out Self-care: Discussed with patient when she gets the urge she is to go to the bathroom. Discussed with patient on not sitting more than 10 minutes on the commode.     01/09/24 Manual: Soft tissue mobilization: Manual work around the perineal body Internal pelvic floor techniques: No emotional/communication barriers or cognitive limitation. Patient is motivated to learn. Patient understands and agrees with treatment goals and plan. PT explains patient will be examined in standing, sitting, and lying down to see how their muscles and joints work. When they are ready, they will be asked to remove their underwear so PT can examine their perineum. The patient is also given the option of providing their own chaperone as one is not provided in our facility. The patient also has the right and is explained the right to defer or refuse any part of the evaluation or treatment including the internal exam. With the patient's consent, PT will use one gloved finger to gently assess the muscles of  the pelvic floor, seeing how well it contracts and relaxes and if there is muscle symmetry. After, the patient will get dressed and PT and patient will discuss exam findings and plan of care. PT and patient discuss plan of care,  schedule, attendance policy and HEP activities.  Therapist gloved finger in the rectum working on the anococcygeal ligament, along the puborectalis, along the anterior rectal wall, and superior transverse perineum.  Neuromuscular re-education: Pelvic floor contraction training: Therapist gloved finger in the rectum working on pelvic floor contraction with a lift and the puborectalis coming forward. Using verbal cues to move bladder upward, breath out all the way with contraction and not bulge down Sitting on mat with ball squeeze and lean back with pelvic floor contraction then sit on a physioball Prone hip extension with pelvic floor contraction                                                                                                                                DATE: 12/24/23  EVAL Examination completed, findings reviewed, pt educated on POC, HEP, and female pelvic floor anatomy, reasoning with pelvic floor assessment internally with pt consent. Pt motivated to participate in PT and agreeable to attempt recommendations.     PATIENT EDUCATION:  01/09/24 Education details: Access Code: [email or web address] Person educated: Patient Education method: Explanation, Demonstration, Tactile cues, Verbal cues, and Handouts Education comprehension: verbalized understanding, returned demonstration, verbal cues required, tactile cues required, and needs further education  HOME EXERCISE PROGRAM: 01/09/24 Access Code: [email or web address] URL: [email or web address] Date: 01/09/2024 Prepared by: Channing Pereyra  Exercises - Seated Pelvic Floor Contraction with Isometric Hip Adduction  - 3 x daily - 7 x weekly - 1 sets - 10 reps - 15 sec hold - Prone Hip Extension - One Pillow  - 1 x daily - 7 x weekly - 2 sets - 10 reps   ASSESSMENT:  CLINICAL IMPRESSION: Patient is a 68 y.o. female who was seen today for physical therapy treatment for pelvic floor weakness. Patient was able to feel the pressure in the  rectum with using the blowing tool  and able to feel the pelvic floor relax into the ball. Patient reports she is sitting on the commode no more than 10 minutes. Patient had restrictions along the lower abdomen. She is able to feel the fullness in the rectum but not the urge along the lower abdomen. Patient will benefit from skilled therapy to improve her sensation of having a bowel movement and more frequently.   OBJECTIVE IMPAIRMENTS: decreased coordination, decreased strength, and increased fascial restrictions.   ACTIVITY LIMITATIONS: toileting  PARTICIPATION LIMITATIONS: shopping and community activity  PERSONAL FACTORS: 1-2 comorbidities: Vulvar dysplasia; IBS; Osteopenia; Abdominal saccrocolpopexy; anterior posterior repair , bladder suspension; 2017; vaginal hysterectomy 1999 are also affecting patient's functional outcome.   REHAB POTENTIAL: Excellent  CLINICAL DECISION MAKING: Evolving/moderate complexity  EVALUATION COMPLEXITY: Moderate   GOALS: Goals reviewed with patient? Yes  SHORT  TERM GOALS: Target date: 01/21/24  Patient is independent with initial HEP for pelvic floor strength and lower abdominal strength.  Baseline: Goal status: Met 03/03/24  2.  Patient is able to feel the sensation of her rectum being full 25% of the time.  Baseline:  Goal status: INITIAL   LONG TERM GOALS: Target date: 03/17/24  Patient independent with advanced HEP for pelvic floor strength and core to generate increased pressure to push stool out.  Baseline:  Goal status: INITIAL  2.  Patient is able to feel her rectum being full 75% of the time due to increased sensitivity of her rectum.  Baseline:  Goal status: INITIAL  3.  Patient is able to push the stool out due to her pelvic floor strength >/= 4/5.  Baseline:  Goal status: INITIAL  4.  CRAIQ-7 score is </= 15 compared to 38 due to feeling like she is able to travel due to feeling she is going to have a bowel movement.   Baseline:  Goal status: INITIAL   PLAN:  PT FREQUENCY: 1x/week  PT DURATION: 12 weeks  PLANNED INTERVENTIONS: 97110-Therapeutic exercises, 97530- Therapeutic activity, 97112- Neuromuscular re-education, 97535- Self Care, [id number]- Manual therapy, and Biofeedback  PLAN FOR NEXT SESSION: work on mining engineer, RUSI, lower abdominal contraction   Channing Pereyra, PT 03/03/24 2:46 PM

## 2024-03-17 ENCOUNTER — Encounter: Payer: Self-pay | Admitting: Physical Therapy

## 2024-03-17 ENCOUNTER — Ambulatory Visit: Attending: Obstetrics and Gynecology | Admitting: Physical Therapy

## 2024-03-17 DIAGNOSIS — M6281 Muscle weakness (generalized): Secondary | ICD-10-CM | POA: Diagnosis present

## 2024-03-17 DIAGNOSIS — R278 Other lack of coordination: Secondary | ICD-10-CM | POA: Diagnosis present

## 2024-03-17 NOTE — Therapy (Signed)
 OUTPATIENT PHYSICAL THERAPY FEMALE PELVIC TREATMENT   Patient Name: Stephanie Armstrong MRN: [id number] DOB:December 18, 1955, 68 y.o., female Today's Date: 03/17/2024  END OF SESSION:  PT End of Session - 03/17/24 1405     Visit Number 4    Date for Recertification  03/17/24    Authorization Type [employer]    Authorization - Visit Number 4    Authorization - Number of Visits 10    PT Start Time 1400    PT Stop Time 1440    PT Time Calculation (min) 40 min    Activity Tolerance Patient tolerated treatment well    Behavior During Therapy [facility] for tasks assessed/performed          Past Medical History:  Diagnosis Date   Allergy    Anxiety    Arthritis    fingers   ASCUS with positive high risk human papillomavirus of vagina dx 10/11/15   Cancer (HCC)    basal cell skin   Hepatitis    age 19 , unknown type   History of vulvar dysplasia    VIN 2   Hypertension    IBS (irritable bowel syndrome)    Osteopenia 2018   Rectocele    Urethral pain    mass   Urinary incontinence    VAIN II (vaginal intraepithelial neoplasia grade II) dx 08/2015   right and left labia minora. This is really VIN II and not VAIN II.    Wears contact lenses    Past Surgical History:  Procedure Laterality Date   ABDOMINAL SACROCOLPOPEXY N/A 03/28/2016   Procedure: ABDOMINO SACROCOLPOPEXY;  Surgeon: Bobie FORBES Cathlyn JAYSON Nikki, MD;  Location: [facility];  Service: Gynecology;  Laterality: N/A;   ANAL RECTAL MANOMETRY N/A 08/09/2022   Procedure: ANO RECTAL MANOMETRY;  Surgeon: Abran Norleen SAILOR, MD;  Location: [facility];  Service: Gastroenterology;  Laterality: N/A;   ANTERIOR AND POSTERIOR REPAIR N/A 03/28/2016   Procedure: ANTERIOR (CYSTOCELE) AND POSTERIOR REPAIR (RECTOCELE);  Surgeon: Bobie FORBES Cathlyn JAYSON Nikki, MD;  Location: [facility];  Service: Gynecology;  Laterality: N/A;   AUGMENTATION MAMMAPLASTY  APRIL 2013   BREAST IMPLANT CHANGED   BILATERAL SALPINGECTOMY Bilateral 03/28/2016   Procedure: BILATERAL  SALPINGECTOMY;  Surgeon: Bobie FORBES Cathlyn JAYSON Nikki, MD;  Location: [facility];  Service: Gynecology;  Laterality: Bilateral;   BLADDER SUSPENSION N/A 03/28/2016   Procedure: TRANSVAGINAL TAPE (TVT) PROCEDURE exact midurethral sling;  Surgeon: Bobie FORBES Cathlyn JAYSON Nikki, MD;  Location: [facility];  Service: Gynecology;  Laterality: N/A;   CATARACT EXTRACTION W/PHACO Right 08/10/2023   Procedure: PHACOEMULSIFICATION, CATARACT;  Surgeon: Juli Blunt, MD;  Location: [facility];  Service: Ophthalmology;  Laterality: Right;  CDE: 6.32   COLONOSCOPY     CYSTOSCOPY N/A 03/28/2016   Procedure: CYSTOSCOPY;  Surgeon: Bobie FORBES Cathlyn JAYSON Nikki, MD;  Location: [facility];  Service: Gynecology;  Laterality: N/A;   CYSTOSCOPY WITH BIOPSY Bilateral 11/03/2015   Procedure: CYSTOSCOPY WITH URETHRAL BIOPSY, FULGERATION, BILATERAL RETROGRADE PYELOGRAMS;  Surgeon: Ricardo Likens, MD;  Location: [facility];  Service: Urology;  Laterality: Bilateral;   GAS INSERTION Right 03/22/2023   Procedure: INSERTION OF GAS;  Surgeon: Valdemar Rogue, MD;  Location: [facility] OR;  Service: Ophthalmology;  Laterality: Right;   INSERTION, STENT, DRUG-ELUTING, LACRIMAL CANALICULUS Right 08/10/2023   Procedure: INSERTION, STENT, DRUG-ELUTING, LACRIMAL CANALICULUS;  Surgeon: Juli Blunt, MD;  Location: [facility];  Service: Ophthalmology;  Laterality: Right;   LYSIS OF ADHESION N/A 03/28/2016   Procedure: LYSIS  OF ADHESION;  Surgeon: Bobie FORBES Cathlyn JAYSON Nikki, MD;  Location: [facility];  Service: Gynecology;  Laterality: N/A;   MASS EXCISION N/A 11/03/2015   Procedure: EXCISION  URETHRAL MASS;  Surgeon: Ricardo Likens, MD;  Location: [facility];  Service: Urology;  Laterality: N/A;   PARS PLANA VITRECTOMY W/REMOVE OF INTERNAL LIMITING MEMBRANE W/TAMPONADE Right 08/23/2023   Procedure: PARS PLANA VITRECTOMY WITH 25 GAUGE WITH REMOVAL OF INTERNAL LIMITING MEMBRANE OF RETINA WITH INTRAOCULAR TAMPONADE;  Surgeon: Valdemar Rogue, MD;   Location: [facility] OR;  Service: Ophthalmology;  Laterality: Right;   REMOVAL RETAINED LENS Right 08/23/2023   Procedure: REMOVAL, RETAINED LENS MATTER;  Surgeon: Valdemar Rogue, MD;  Location: [facility] OR;  Service: Ophthalmology;  Laterality: Right;   SCLERAL BUCKLE Right 03/22/2023   Procedure: SCLERAL BUCKLE;  Surgeon: Valdemar Rogue, MD;  Location: [facility] OR;  Service: Ophthalmology;  Laterality: Right;   TUBAL LIGATION     VAGINAL HYSTERECTOMY  1999   VITRECTOMY 25 GAUGE WITH SCLERAL BUCKLE Right 03/22/2023   Procedure: VITRECTOMY 25 GAUGE;  Surgeon: Valdemar Rogue, MD;  Location: [facility] OR;  Service: Ophthalmology;  Laterality: Right;   Patient Active Problem List   Diagnosis Date Noted   Constipation due to outlet dysfunction 08/15/2022   Generalized anxiety disorder 09/06/2016   Status post surgery 03/28/2016   Anxiety 08/11/2015   Chondromalacia 08/11/2015   Carpal tunnel syndrome 07/02/2014   Essential hypertension, benign 01/12/2013   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   Easy bruisability 06/26/2012   Menopause 06/26/2012   Insomnia 06/26/2012    PCP: Bertell Satterfield, MD  REFERRING PROVIDER: Cathlyn JAYSON Nikki Bobie FORBES, MD   REFERRING DIAG: [phone] (ICD-10-CM) - Pelvic floor weakness   THERAPY DIAG:  Muscle weakness (generalized)  Other lack of coordination  Rationale for Evaluation and Treatment: Rehabilitation  ONSET DATE: 07/2023  SUBJECTIVE:                                                                                                                                                                                           SUBJECTIVE STATEMENT: The breathing is helping. I am using the breathing while going to the bathroom. I have been eating more vegetables. My stool is a lighter color. Patient is emptying her rectum better.  I am walking now. Patient is emptying her rectum 70% of the time.   PAIN:  Are you having pain? No   PRECAUTIONS: Other: cancer  RED  FLAGS: None   WEIGHT BEARING RESTRICTIONS: No  FALLS:  Has patient fallen in last 6 months? No  OCCUPATION: retired  ACTIVITY LEVEL : walking  PLOF: Independent  PATIENT GOALS: be able to push the stool out better.   PERTINENT HISTORY:  Vulvar dysplasia; IBS; Osteopenia; Abdominal saccrocolpopexy; anterior posterior repair , bladder suspension; 2017; vaginal hysterectomy 1999 Sexual abuse: No  BOWEL MOVEMENT: Pain with bowel movement: No Type of bowel movement:Type (Bristol Stool Scale) type 2, Frequency every 3-4 days, Strain sometimes, and Splinting yes Fully empty rectum: No Leakage: No Pads: No Fiber supplement/laxative will take Metamucil  URINATION: Pain with urination: No Fully empty bladder: Yes: sometimes takes 2 trips but is infrequent Stream: Weak Urgency: sometimes when been in a car and get to the door Frequency: average Leakage: none  INTERCOURSE:not active   PREGNANCY: Vaginal deliveries 3 Tearing Yes: first child  PROLAPSE: None   OBJECTIVE:  Note: Objective measures were completed at Evaluation unless otherwise noted.  DIAGNOSTIC FINDINGS:  Anal manometry: Weak internal and external sphincter, evidence of dyssynergic defecation and rectal hyposensitivity  PATIENT SURVEYS:  PFIQ-7: 28 CRAIQ-7: 38 03/17/24: PFIQ-7: 33 CRAIQ-7: 33  COGNITION: Overall cognitive status: Within functional limits for tasks assessed     SENSATION: Light touch: Appears intact   POSTURE: No Significant postural limitations   LUMBARAROM/PROM:Lumbar ROM is full   LOWER EXTREMITY ROM: full bilateral hip ROM   LOWER EXTREMITY MMT: bilateral hip ROM is 5/5  PALPATION:  Abdominal: when contracting her abdomen the lower abdomen with bulge.                 External Perineal Exam: intact                             Internal Pelvic Floor: tight anococcygeal ligament, patient will contract the gluteals instead of the rectum  Patient confirms identification  and approves PT to assess internal pelvic floor and treatment Yes  PELVIC MMT:   MMT eval 01/09/24  Internal Anal Sphincter 2/5 2/5  External Anal Sphincter 2/5 2/5  Puborectalis 1/5 2/5  (Blank rows = not tested)        TONE: Low tone  PROLAPSE: none  TODAY'S TREATMENT:  03/17/24 Manual: Soft tissue mobilization: Supine with lower half on incline and manual work to pull the tissue toward her head and had her repeat the demonstration Exercises: Stretches/mobility: Z stretch with lifting up on knees and then bend the other way 5 times each way Happy baby holding 1 minute Strengthening: Prone hip extension 2 x 10  Quadruped with knee on yoga block moving hips side to side then forward and back then cat cow Supine alternate hip flexion with band around knees and only letting the toe touch Self-care: Reviewed the techniques to have a bowel movement with breathing and she is good with it    03/03/24 Manual: Myofascial release: Fascial release with patient on an incline and therapist is releasing the lower abdomen and dong tissue rolling of the  scar Neuromuscular re-education: Form correction: Quadruped with knee on yoga block moving hips side to side then forward and back then cat cow Z stretch with lifting up on knees and then bend the other way 5 times each way Quadruped with therapist putting pressure on the anococcygeal ligament and she is moving back and forth.  Down training: Sitting on ball with diaphragmatic breathing to relax the pelvic floor then breath out into the tube to push the stool out Self-care: Discussed with patient when she gets the urge she is to go to the bathroom. Discussed with patient on  not sitting more than 10 minutes on the commode.     01/09/24 Manual: Soft tissue mobilization: Manual work around the perineal body Internal pelvic floor techniques: No emotional/communication barriers or cognitive limitation. Patient is motivated to learn.  Patient understands and agrees with treatment goals and plan. PT explains patient will be examined in standing, sitting, and lying down to see how their muscles and joints work. When they are ready, they will be asked to remove their underwear so PT can examine their perineum. The patient is also given the option of providing their own chaperone as one is not provided in our facility. The patient also has the right and is explained the right to defer or refuse any part of the evaluation or treatment including the internal exam. With the patient's consent, PT will use one gloved finger to gently assess the muscles of the pelvic floor, seeing how well it contracts and relaxes and if there is muscle symmetry. After, the patient will get dressed and PT and patient will discuss exam findings and plan of care. PT and patient discuss plan of care, schedule, attendance policy and HEP activities.  Therapist gloved finger in the rectum working on the anococcygeal ligament, along the puborectalis, along the anterior rectal wall, and superior transverse perineum.  Neuromuscular re-education: Pelvic floor contraction training: Therapist gloved finger in the rectum working on pelvic floor contraction with a lift and the puborectalis coming forward. Using verbal cues to move bladder upward, breath out all the way with contraction and not bulge down Sitting on mat with ball squeeze and lean back with pelvic floor contraction then sit on a physioball Prone hip extension with pelvic floor contraction                                                                                                                                DATE: 12/24/23  EVAL Examination completed, findings reviewed, pt educated on POC, HEP, and female pelvic floor anatomy, reasoning with pelvic floor assessment internally with pt consent. Pt motivated to participate in PT and agreeable to attempt recommendations.     PATIENT EDUCATION:   03/17/24 Education details: Access Code: [email or web address] Person educated: Patient Education method: Explanation, Demonstration, Tactile cues, Verbal cues, and Handouts Education comprehension: verbalized understanding, returned demonstration, verbal cues required, tactile cues required, and needs further education  HOME EXERCISE PROGRAM: 03/17/24 Access Code: [email or web address] URL: [email or web address] Date: 03/17/2024 Prepared by: Channing Pereyra  Exercises - Seated Pelvic Floor Contraction with Isometric Hip Adduction  - 3 x daily - 7 x weekly - 1 sets - 10 reps - 15 sec hold - Prone Hip Extension - One Pillow  - 1 x daily - 7 x weekly - 2 sets - 10 reps - Quadruped Yoga Block Lift Off  - 1 x daily - 2 x weekly - 1 sets - 10 reps - Posterior Chain Stretch  - 1 x daily - 1 x weekly -  3 sets - 5 reps - Supine 90/90 Alternating Toe Touch  - 1 x daily - 2 x weekly - 3 sets - 10 reps - Happy Baby with Pelvic Floor Lengthening  - 1 x daily - 2 x weekly - 1 sets - 2 reps - 1 min hold   ASSESSMENT:  CLINICAL IMPRESSION: Patient is a 68 y.o. female who was seen today for physical therapy treatment for pelvic floor weakness. Patient is now able to feel the rectum full. She is using the breathing technique to push the stool out. She is not sitting on the commode for more than 10 minutes. She is having softer and longer stool shape compared to nuggets. Patient is independent with her HEP.   OBJECTIVE IMPAIRMENTS: decreased coordination, decreased strength, and increased fascial restrictions.   ACTIVITY LIMITATIONS: toileting  PARTICIPATION LIMITATIONS: shopping and community activity  PERSONAL FACTORS: 1-2 comorbidities: Vulvar dysplasia; IBS; Osteopenia; Abdominal saccrocolpopexy; anterior posterior repair , bladder suspension; 2017; vaginal hysterectomy 1999 are also affecting patient's functional outcome.   REHAB POTENTIAL: Excellent  CLINICAL DECISION MAKING: Evolving/moderate  complexity  EVALUATION COMPLEXITY: Moderate   GOALS: Goals reviewed with patient? Yes  SHORT TERM GOALS: Target date: 01/21/24  Patient is independent with initial HEP for pelvic floor strength and lower abdominal strength.  Baseline: Goal status: Met 03/03/24  2.  Patient is able to feel the sensation of her rectum being full 25% of the time.  Baseline:  Goal status: Met 03/17/24    LONG TERM GOALS: Target date: 03/17/24  Patient independent with advanced HEP for pelvic floor strength and core to generate increased pressure to push stool out.  Baseline:  Goal status: Met 03/17/24  2.  Patient is able to feel her rectum being full 75% of the time due to increased sensitivity of her rectum.  Baseline:  Goal status: Met 03/17/24  3.  Patient is able to push the stool out due to her pelvic floor strength >/= 4/5.  Baseline: patient did not want pelvic floor assessed Goal status: Partially met 03/17/24  4.  CRAIQ-7 score is </= 15 compared to 38 due to feeling like she is able to travel due to feeling she is going to have a bowel movement.  Baseline:  Goal status: Not met 03/17/24   PLAN: Discharge to HEP the visit.      Channing Pereyra, PT 03/17/24 2:43 PM   PHYSICAL THERAPY DISCHARGE SUMMARY  Visits from Start of Care: 4  Current functional level related to goals / functional outcomes: See above.   Remaining deficits: See above.    Education / Equipment: HEP   Patient agrees to discharge. Patient goals were partially met. Patient is being discharged due to being pleased with the current functional level. Thank you for the referral.   Channing Pereyra, PT 03/17/24 2:43 PM

## 2024-03-26 ENCOUNTER — Encounter (INDEPENDENT_AMBULATORY_CARE_PROVIDER_SITE_OTHER): Admitting: Ophthalmology

## 2024-03-26 NOTE — Progress Notes (Signed)
 [facility] - Clinic Note  03/27/2024   CHIEF COMPLAINT Patient presents for Retina Follow Up  HISTORY OF PRESENT ILLNESS: Stephanie Armstrong is a 68 y.o. female who presents to the clinic today for:  HPI     Retina Follow Up   Diagnosis: Retained lens material.  In right eye.  This started 1 year ago.  Severity is moderate.  Duration of 3 months.  I, the attending physician,  performed the HPI with the patient and updated documentation appropriately.        Comments   Pt states she has been wearing a contact in her right eye going on 1 month and VA still isn't clear. Pt has been consistent with PF BID OD and bromfenac  BID OD. Pt denies FOL/pain.       Last edited by Valdemar Rogue, MD on 03/30/2024  8:43 PM.     Pt states she is now wearing a soft CTL OD. Feels its helped a little bit, gives her more awareness on the far right side. Wasn't able to wear a hard lens. Pt was only using Pred and Brom BID  Referring physician: Bertell Satterfield, MD [address] [place],  [place] [address]  HISTORICAL INFORMATION:  Selected notes from the MEDICAL RECORD NUMBER Referred by Dr. Darroll for concern of macular hole OD LEE:  Ocular Hx- PMH-   CURRENT MEDICATIONS: Current Outpatient Medications (Ophthalmic Drugs)  Medication Sig   brimonidine  (ALPHAGAN ) 0.2 % ophthalmic solution Place 1 drop into the right eye in the morning and at bedtime.   Bromfenac  Sodium 0.07 % SOLN Place 1 drop into the right eye 4 (four) times daily.   prednisoLONE  acetate (PRED FORTE ) 1 % ophthalmic suspension Place 1 drop into the right eye 4 (four) times daily.   timolol  (TIMOPTIC ) 0.5 % ophthalmic solution Place 1 drop into the right eye 2 (two) times daily. (Patient not taking: Reported on 03/27/2024)   No current facility-administered medications for this visit. (Ophthalmic Drugs)   Current Outpatient Medications (Other)  Medication Sig   acetaZOLAMIDE  ER (DIAMOX ) 500 MG  capsule Take 500 mg by mouth 2 (two) times daily. (Patient not taking: Reported on 10/22/2023)   ALPRAZolam  (XANAX ) 0.5 MG tablet Take 1 tab p.o. qhs for 2 weeks, then take 1/2 tab p.o. qhs, then take 1/2 tab qod, then discontinue.   amLODipine-benazepril (LOTREL) 5-40 MG capsule TAKE (1) CAPSULE BY MOUTH ONCE DAILY.   Cholecalciferol (VITAMIN D3) 125 MCG (5000 UT) CAPS Take 5,000 Units by mouth daily.   diphenoxylate-atropine  (LOMOTIL) 2.5-0.025 MG tablet TAKE 2 TABLETS BY MOUTH 4 TIMES DAILY AS NEEDED FOR DIARRHEA OR LOOSE STOOLS. MAX 8 TABLETS IN 24 HOURS.   estradiol  (ESTRACE ) 0.1 MG/GM vaginal cream Place 1/2 gram vaginally at hs two to three times per week as needed.   fluorouracil (EFUDEX) 5 % cream Apply 1 Application topically daily as needed (skin cancer issue).   hydrochlorothiazide (HYDRODIURIL) 25 MG tablet TAKE ONE TABLET BY MOUTH ONCE DAILY AS NEEDED. DO NOT EXCEED 3 PER WEEK. APPT REQUIRED FOR FUTURE REFILLS   ibuprofen  (ADVIL ) 800 MG tablet Take 1 tablet (800 mg total) by mouth every 8 (eight) hours as needed.   pantoprazole (PROTONIX) 40 MG tablet Take 40 mg by mouth daily as needed (acid reflux).   Probiotic, Lactobacillus, CAPS Take 1 capsule by mouth daily.   Semaglutide,0.25 or 0.5MG /DOS, (OZEMPIC, 0.25 OR 0.5 MG/DOSE,) 2 MG/1.5ML SOPN Inject 0.5 mg into the skin  once a week. (Patient not taking: Reported on 10/22/2023)   tretinoin (RETIN-A) 0.05 % cream Apply 1 Application topically 3 (three) times a week.   TRULANCE  3 MG TABS TAKE 1 TABLET BY MOUTH DAILY   zolpidem (AMBIEN) 10 MG tablet Take 10 mg by mouth at bedtime.   No current facility-administered medications for this visit. (Other)   REVIEW OF SYSTEMS:     ALLERGIES Allergies  Allergen Reactions   Augmentin [Amoxicillin-Pot Clavulanate] Diarrhea   Codeine Nausea Only   PAST MEDICAL HISTORY Past Medical History:  Diagnosis Date   Allergy    Anxiety    Arthritis    fingers   ASCUS with positive high risk  human papillomavirus of vagina dx 10/11/15   Cancer (HCC)    basal cell skin   Hepatitis    age 73 , unknown type   History of vulvar dysplasia    VIN 2   Hypertension    IBS (irritable bowel syndrome)    Osteopenia 2018   Rectocele    Urethral pain    mass   Urinary incontinence    VAIN II (vaginal intraepithelial neoplasia grade II) dx 08/2015   right and left labia minora. This is really VIN II and not VAIN II.    Wears contact lenses    Past Surgical History:  Procedure Laterality Date   ABDOMINAL SACROCOLPOPEXY N/A 03/28/2016   Procedure: ABDOMINO SACROCOLPOPEXY;  Surgeon: Bobie FORBES Cathlyn JAYSON Nikki, MD;  Location: [facility];  Service: Gynecology;  Laterality: N/A;   ANAL RECTAL MANOMETRY N/A 08/09/2022   Procedure: ANO RECTAL MANOMETRY;  Surgeon: Abran Norleen SAILOR, MD;  Location: [facility];  Service: Gastroenterology;  Laterality: N/A;   ANTERIOR AND POSTERIOR REPAIR N/A 03/28/2016   Procedure: ANTERIOR (CYSTOCELE) AND POSTERIOR REPAIR (RECTOCELE);  Surgeon: Bobie FORBES Cathlyn JAYSON Nikki, MD;  Location: [facility];  Service: Gynecology;  Laterality: N/A;   AUGMENTATION MAMMAPLASTY  APRIL 2013   BREAST IMPLANT CHANGED   BILATERAL SALPINGECTOMY Bilateral 03/28/2016   Procedure: BILATERAL SALPINGECTOMY;  Surgeon: Bobie FORBES Cathlyn JAYSON Nikki, MD;  Location: [facility];  Service: Gynecology;  Laterality: Bilateral;   BLADDER SUSPENSION N/A 03/28/2016   Procedure: TRANSVAGINAL TAPE (TVT) PROCEDURE exact midurethral sling;  Surgeon: Bobie FORBES Cathlyn JAYSON Nikki, MD;  Location: [facility];  Service: Gynecology;  Laterality: N/A;   CATARACT EXTRACTION W/PHACO Right 08/10/2023   Procedure: PHACOEMULSIFICATION, CATARACT;  Surgeon: Juli Blunt, MD;  Location: [facility];  Service: Ophthalmology;  Laterality: Right;  CDE: 6.32   COLONOSCOPY     CYSTOSCOPY N/A 03/28/2016   Procedure: CYSTOSCOPY;  Surgeon: Bobie FORBES Cathlyn JAYSON Nikki, MD;  Location: [facility];  Service: Gynecology;  Laterality: N/A;   CYSTOSCOPY WITH BIOPSY  Bilateral 11/03/2015   Procedure: CYSTOSCOPY WITH URETHRAL BIOPSY, FULGERATION, BILATERAL RETROGRADE PYELOGRAMS;  Surgeon: Ricardo Likens, MD;  Location: [facility];  Service: Urology;  Laterality: Bilateral;   GAS INSERTION Right 03/22/2023   Procedure: INSERTION OF GAS;  Surgeon: Valdemar Rogue, MD;  Location: [facility] OR;  Service: Ophthalmology;  Laterality: Right;   INSERTION, STENT, DRUG-ELUTING, LACRIMAL CANALICULUS Right 08/10/2023   Procedure: INSERTION, STENT, DRUG-ELUTING, LACRIMAL CANALICULUS;  Surgeon: Juli Blunt, MD;  Location: [facility];  Service: Ophthalmology;  Laterality: Right;   LYSIS OF ADHESION N/A 03/28/2016   Procedure: LYSIS OF ADHESION;  Surgeon: Bobie FORBES Cathlyn JAYSON Nikki, MD;  Location: [facility];  Service: Gynecology;  Laterality: N/A;   MASS EXCISION N/A 11/03/2015   Procedure: EXCISION  URETHRAL MASS;  Surgeon: Ricardo Likens, MD;  Location[facility];  Service: Urology;  Laterality: N/A;   PARS PLANA VITRECTOMY W/REMOVE OF INTERNAL LIMITING MEMBRANE W/TAMPONADE Right 08/23/2023   Procedure: PARS PLANA VITRECTOMY WITH 25 GAUGE WITH REMOVAL OF INTERNAL LIMITING MEMBRANE OF RETINA WITH INTRAOCULAR TAMPONADE;  Surgeon: Valdemar Rogue, MD;  Location: [facility] OR;  Service: Ophthalmology;  Laterality: Right;   REMOVAL RETAINED LENS Right 08/23/2023   Procedure: REMOVAL, RETAINED LENS MATTER;  Surgeon: Valdemar Rogue, MD;  Location: [facility] OR;  Service: Ophthalmology;  Laterality: Right;   SCLERAL BUCKLE Right 03/22/2023   Procedure: SCLERAL BUCKLE;  Surgeon: Valdemar Rogue, MD;  Location: [facility] OR;  Service: Ophthalmology;  Laterality: Right;   TUBAL LIGATION     VAGINAL HYSTERECTOMY  1999   VITRECTOMY 25 GAUGE WITH SCLERAL BUCKLE Right 03/22/2023   Procedure: VITRECTOMY 25 GAUGE;  Surgeon: Valdemar Rogue, MD;  Location: [facility] OR;  Service: Ophthalmology;  Laterality: Right;   FAMILY HISTORY Family History  Problem Relation Age of Onset   Hypertension Mother     Osteoporosis Mother    Paget's disease of bone Mother    Heart failure Mother    Cancer Father        MELANOMA   Diabetes Father    Hypertension Father    Heart disease Father    Cancer Sister        THYROID    Breast cancer Paternal Grandmother    Colon cancer Neg Hx    Pancreatic cancer Neg Hx    Esophageal cancer Neg Hx    SOCIAL HISTORY Social History   Tobacco Use   Smoking status: Never   Smokeless tobacco: Never  Vaping Use   Vaping status: Never Used  Substance Use Topics   Alcohol use: Not Currently   Drug use: No       OPHTHALMIC EXAM:  Base Eye Exam     Visual Acuity (Snellen - Linear)       Right Left   Dist cc 20/100 -2 20/25   Dist ph cc 20/100 20/20 -3    Correction: Contacts         Tonometry (Tonopen, 8:57 AM)       Right Left   Pressure 17 14         Pupils       Dark Light Shape React APD   Right 4 4 Irregular NR None   Left 3 1 Round Brisk None         Visual Fields       Left Right    Full Full         Extraocular Movement       Right Left    Full, Ortho Full, Ortho         Neuro/Psych     Oriented x3: Yes   Mood/Affect: Normal         Dilation     Both eyes: 1.0% Mydriacyl , 2.5% Phenylephrine  @ 8:58 AM           Slit Lamp and Fundus Exam     Slit Lamp Exam       Right Left   Lids/Lashes Dermatochalasis - upper lid Dermatochalasis - upper lid, mild MGD   Conjunctiva/Sclera White and quiet, sutures dissolving, STK ST quad White and quiet   Cornea Mild arcus; well healed cataract wound, 2+ PEE 1-2+ fine Punctate epithelial erosions, arcus   Anterior Chamber Deep and clear deep and clear  Iris Round and dilated, defect at 0130, iridadenosis Round and dilated   Lens Aphakia 2-3+ Nuclear sclerosis, 2-3+ Cortical cataract   Anterior Vitreous post vitrectomy, trace cell pigment syneresis, fine pigment, Posterior vitreous detachment, vitreous condensations         Fundus Exam       Right Left    Disc mild Pallor, Sharp rim, Compact Pink and Sharp   C/D Ratio 0.2 0.1   Macula Flat, Blunted foveal reflex, ERM / PRF gone, residual thickening, punctate heme IT mac -- improved, persistent cystic changes nasal mac -- improved Flat, good foveal reflex, RPE mottling and clumping, No heme or edema   Vessels attenuated, Tortuous attenuated, Tortuous   Periphery Attached over buckle, good buckle height, good laser over buckle and around tears; PRE:OP, Bullous temporal detachment from 0800-1030 with bi-lobed HST at 1030 Attached, No heme, mild paving stone degeneration inferiorly, mild reticular degeneration           IMAGING AND PROCEDURES  Imaging and Procedures for 03/27/2024  OCT, Retina - OU - Both Eyes       Right Eye Quality was good. Central Foveal Thickness: 382. Progression has been stable. Findings include no SRF, abnormal foveal contour, intraretinal fluid, macular hole, outer retinal atrophy (ERM and PRF gone, persistent central cystic changes, patchy central ORA, fine FTMH IT mac).   Left Eye Quality was borderline. Central Foveal Thickness: 270. Progression has been stable. Findings include normal foveal contour, no IRF, no SRF, myopic contour (Partial PVD).   Notes *Images captured and stored on drive  Diagnosis / Impression:  OD: ERM and PRF gone, persistent central cystic changes, patchy central ORA, fine FTMH IT mac  OS: NFP; no IRF/SRF   Clinical management:  See below  Abbreviations: NFP - Normal foveal profile. CME - cystoid macular edema. PED - pigment epithelial detachment. IRF - intraretinal fluid. SRF - subretinal fluid. EZ - ellipsoid zone. ERM - epiretinal membrane. ORA - outer retinal atrophy. ORT - outer retinal tubulation. SRHM - subretinal hyper-reflective material. IRHM - intraretinal hyper-reflective material           ASSESSMENT/PLAN:   ICD-10-CM   1. Retained lens material following cataract surgery, right eye  H59.021 OCT, Retina - OU -  Both Eyes    2. Right retinal detachment  H33.21     3. Cystoid macular edema of right eye  H35.351     4. Epiretinal membrane (ERM) of right eye  H35.371     5. Essential hypertension  I10     6. Hypertensive retinopathy of both eyes  H35.033     7. Combined forms of age-related cataract of left eye  H25.812     8. Aphakia of right eye  H27.01      1. Retained lens material OD - Pt underwent phaco on 04.25.25 - complicated by posterior capsule rupture and dislocation of lens into the vitreous  - pt left aphakic  - pre op BCVA OD HM - s/p PPV/removal of retained lens material/tissue blue/MP OD, 05.08.25             - doing well   - retained lens material removed             - retina attached and in good position, ERM gone              - IOP 17 - OCT OD: ERM and PRF gone, persistent central cystic changes, patchy central ORA, fine FTMH IT mac              -  cont PF and Bromfenac  QID OD (poss CME component)--pt was only doing BID, emphasized to use QID                         erythromycin  ung PRN OD   AT's QID OU             - post op drop and positioning instructions reviewed               - f/u 3 months, DFE/OCT  2. H/o Rhegmatogenous retinal detachment, OD - bullous temporal mac off detachment -- pt reports center involvement maybe 1-2 wks prior - detached from 0800 to 1030, fovea off, bi-lobed HST at 1030 - s/p SB + PPV/PFO/EL/FAX/14% C3F8 OD, 12.05.2024 - retina attached and in good position -- good buckle height and laser around breaks             - IOP 17  - monitor  3. CME OD  - s/p STK OD #1 (06.27.25) - interval improvement in central cystic changes on PF and bromfenac  QID OD  - CME slightly increased today -- pt reports using drops just BID - recommend increasing PF and Brom back to QID OD  3. Epiretinal membrane, right eye  - original exam showed dense ERM with central thickening and pre retinal fibrosis - s/p PPV/removal of retained lens material/tissue  blue/MP OD, 05.08.25 as above - OCT shows ERM and PRF gone, stable improvement in central cystic changes,  patchy central ORA, fine FTMH IT mac - post-op drops as above  4,5. Hypertensive retinopathy OU - discussed importance of tight BP control - monitor  6. Mixed Cataract OS - The symptoms of cataract, surgical options, and treatments and risks were discussed with patient. - discussed diagnosis and progression - monitor  7. Aphakia OD  - discussed options once healed from this most recent surgery  - Sees Dr. Norleen Hamilton for specialty contact lens-wearing soft CTL now (monthly).  Ophthalmic Meds Ordered this visit:  Meds ordered this encounter  Medications   Bromfenac  Sodium 0.07 % SOLN    Sig: Place 1 drop into the right eye 4 (four) times daily.    Dispense:  6 mL    Refill:  6   prednisoLONE  acetate (PRED FORTE ) 1 % ophthalmic suspension    Sig: Place 1 drop into the right eye 4 (four) times daily.    Dispense:  15 mL    Refill:  3     Return in about 3 months (around 06/25/2024) for retained lens material OD, DFE, OCT.  There are no Patient Instructions on file for this visit.  This document serves as a record of services personally performed by Redell JUDITHANN Hans, MD, PhD. It was created on their behalf by Auston Muzzy, COMT. The creation of this record is the provider's dictation and/or activities during the visit.  Electronically signed by: Auston Muzzy, COMT 03/30/2024 8:43 PM  This document serves as a record of services personally performed by Redell JUDITHANN Hans, MD, PhD. It was created on their behalf by Almetta Pesa, an ophthalmic technician. The creation of this record is the provider's dictation and/or activities during the visit.    Electronically signed by: Almetta Pesa, OA, 03/30/2024  8:43 PM  Redell JUDITHANN Hans, M.D., Ph.D. Diseases & Surgery of the Retina and Vitreous [facility]  I have reviewed the above documentation for  accuracy and completeness, and I agree with the above.  Redell JUDITHANN Hans, M.D., Ph.D. 03/30/2024 8:46 PM   Abbreviations: M myopia (nearsighted); A astigmatism; H hyperopia (farsighted); P presbyopia; Mrx spectacle prescription;  CTL contact lenses; OD right eye; OS left eye; OU both eyes  XT exotropia; ET esotropia; PEK punctate epithelial keratitis; PEE punctate epithelial erosions; DES dry eye syndrome; MGD meibomian gland dysfunction; ATs artificial tears; PFAT's preservative free artificial tears; NSC nuclear sclerotic cataract; PSC posterior subcapsular cataract; ERM epi-retinal membrane; PVD posterior vitreous detachment; RD retinal detachment; DM diabetes mellitus; DR diabetic retinopathy; NPDR non-proliferative diabetic retinopathy; PDR proliferative diabetic retinopathy; CSME clinically significant macular edema; DME diabetic macular edema; dbh dot blot hemorrhages; CWS cotton wool spot; POAG primary open angle glaucoma; C/D cup-to-disc ratio; HVF humphrey visual field; GVF goldmann visual field; OCT optical coherence tomography; IOP intraocular pressure; BRVO Branch retinal vein occlusion; CRVO central retinal vein occlusion; CRAO central retinal artery occlusion; BRAO branch retinal artery occlusion; RT retinal tear; SB scleral buckle; PPV pars plana vitrectomy; VH Vitreous hemorrhage; PRP panretinal laser photocoagulation; IVK intravitreal kenalog ; VMT vitreomacular traction; MH Macular hole;  NVD neovascularization of the disc; NVE neovascularization elsewhere; AREDS age related eye disease study; ARMD age related macular degeneration; POAG primary open angle glaucoma; EBMD epithelial/anterior basement membrane dystrophy; ACIOL anterior chamber intraocular lens; IOL intraocular lens; PCIOL posterior chamber intraocular lens; Phaco/IOL phacoemulsification with intraocular lens placement; PRK photorefractive keratectomy; LASIK laser assisted in situ keratomileusis; HTN hypertension; DM diabetes  mellitus; COPD chronic obstructive pulmonary disease

## 2024-03-27 ENCOUNTER — Encounter (INDEPENDENT_AMBULATORY_CARE_PROVIDER_SITE_OTHER): Payer: Self-pay | Admitting: Ophthalmology

## 2024-03-27 ENCOUNTER — Ambulatory Visit (INDEPENDENT_AMBULATORY_CARE_PROVIDER_SITE_OTHER): Admitting: Ophthalmology

## 2024-03-27 DIAGNOSIS — H35371 Puckering of macula, right eye: Secondary | ICD-10-CM

## 2024-03-27 DIAGNOSIS — I10 Essential (primary) hypertension: Secondary | ICD-10-CM

## 2024-03-27 DIAGNOSIS — H59021 Cataract (lens) fragments in eye following cataract surgery, right eye: Secondary | ICD-10-CM

## 2024-03-27 DIAGNOSIS — H2701 Aphakia, right eye: Secondary | ICD-10-CM

## 2024-03-27 DIAGNOSIS — H35033 Hypertensive retinopathy, bilateral: Secondary | ICD-10-CM

## 2024-03-27 DIAGNOSIS — H3321 Serous retinal detachment, right eye: Secondary | ICD-10-CM

## 2024-03-27 DIAGNOSIS — H25812 Combined forms of age-related cataract, left eye: Secondary | ICD-10-CM

## 2024-03-27 DIAGNOSIS — H35351 Cystoid macular degeneration, right eye: Secondary | ICD-10-CM | POA: Diagnosis not present

## 2024-03-27 MED ORDER — BROMFENAC SODIUM 0.07 % OP SOLN: 1.0000 [drp] | Freq: Four times a day (QID) | OPHTHALMIC | 6 refills | Status: AC

## 2024-03-27 MED ORDER — PREDNISOLONE ACETATE 1 % OP SUSP: 1.0000 [drp] | Freq: Four times a day (QID) | OPHTHALMIC | 3 refills | Status: AC

## 2024-03-30 ENCOUNTER — Encounter (INDEPENDENT_AMBULATORY_CARE_PROVIDER_SITE_OTHER): Payer: Self-pay | Admitting: Ophthalmology

## 2024-08-07 ENCOUNTER — Encounter (INDEPENDENT_AMBULATORY_CARE_PROVIDER_SITE_OTHER): Admitting: Ophthalmology

## 2024-10-23 ENCOUNTER — Ambulatory Visit: Admitting: Obstetrics and Gynecology

## 2024-10-28 ENCOUNTER — Ambulatory Visit: Admitting: Obstetrics and Gynecology
